# Patient Record
Sex: Female | Born: 1970 | Race: Black or African American | Hispanic: No | Marital: Single | Smoking: Never smoker
Health system: Southern US, Community
[De-identification: ages and names within clinical notes are randomized; demographics above are authoritative.]

## PROBLEM LIST (undated history)

## (undated) DIAGNOSIS — M25471 Effusion, right ankle: Secondary | ICD-10-CM

## (undated) DIAGNOSIS — M25472 Effusion, left ankle: Secondary | ICD-10-CM

## (undated) DIAGNOSIS — K829 Disease of gallbladder, unspecified: Secondary | ICD-10-CM

## (undated) DIAGNOSIS — E559 Vitamin D deficiency, unspecified: Secondary | ICD-10-CM

## (undated) DIAGNOSIS — I10 Essential (primary) hypertension: Secondary | ICD-10-CM

## (undated) HISTORY — PX: CHOLECYSTECTOMY: SHX55

## (undated) HISTORY — DX: Effusion, left ankle: M25.472

## (undated) HISTORY — DX: Vitamin D deficiency, unspecified: E55.9

## (undated) HISTORY — DX: Disease of gallbladder, unspecified: K82.9

## (undated) HISTORY — DX: Essential (primary) hypertension: I10

## (undated) HISTORY — DX: Effusion, right ankle: M25.471

---

## 1998-12-28 ENCOUNTER — Emergency Department (HOSPITAL_COMMUNITY): Admission: EM | Admit: 1998-12-28 | Discharge: 1998-12-28 | Payer: Self-pay

## 1999-08-07 ENCOUNTER — Other Ambulatory Visit: Admission: RE | Admit: 1999-08-07 | Discharge: 1999-08-07 | Payer: Self-pay | Admitting: *Deleted

## 2000-09-22 ENCOUNTER — Other Ambulatory Visit: Admission: RE | Admit: 2000-09-22 | Discharge: 2000-09-22 | Payer: Self-pay | Admitting: *Deleted

## 2001-10-05 ENCOUNTER — Other Ambulatory Visit: Admission: RE | Admit: 2001-10-05 | Discharge: 2001-10-05 | Payer: Self-pay | Admitting: *Deleted

## 2002-10-26 ENCOUNTER — Other Ambulatory Visit: Admission: RE | Admit: 2002-10-26 | Discharge: 2002-10-26 | Payer: Self-pay | Admitting: *Deleted

## 2006-12-01 ENCOUNTER — Encounter: Admission: RE | Admit: 2006-12-01 | Discharge: 2006-12-01 | Payer: Self-pay | Admitting: Family Medicine

## 2008-08-25 IMAGING — CR DG KNEE COMPLETE 4+V*R*
4 series · 4 of 4 positions shown · non-contrast
Comparison: none

CLINICAL DATA: Patient fell on 11/29/06 and has pain in the patella region.  
 RIGHT KNEE FOUR VIEWS:
 There is soft tissue swelling anterior to the patella suggesting either hematoma or traumatic prepatellar bursitis.  The patella itself appears normal.  The patient does have a small benign appearing exostosis from the medial aspect of the metaphyseal region of the proximal right tibia.  This was described on the prior study dated 12/28/98.  There is also an approximately 3.3cm sclerotic lesion in the distal femoral shaft.  It has benign characteristics and may represent an old bone infarct or perhaps the remnant of non ossifying fibroma.  The patient has no history of malignancy, I doubt that this is of any clinical significance.

[view not recorded (1 of 4)]
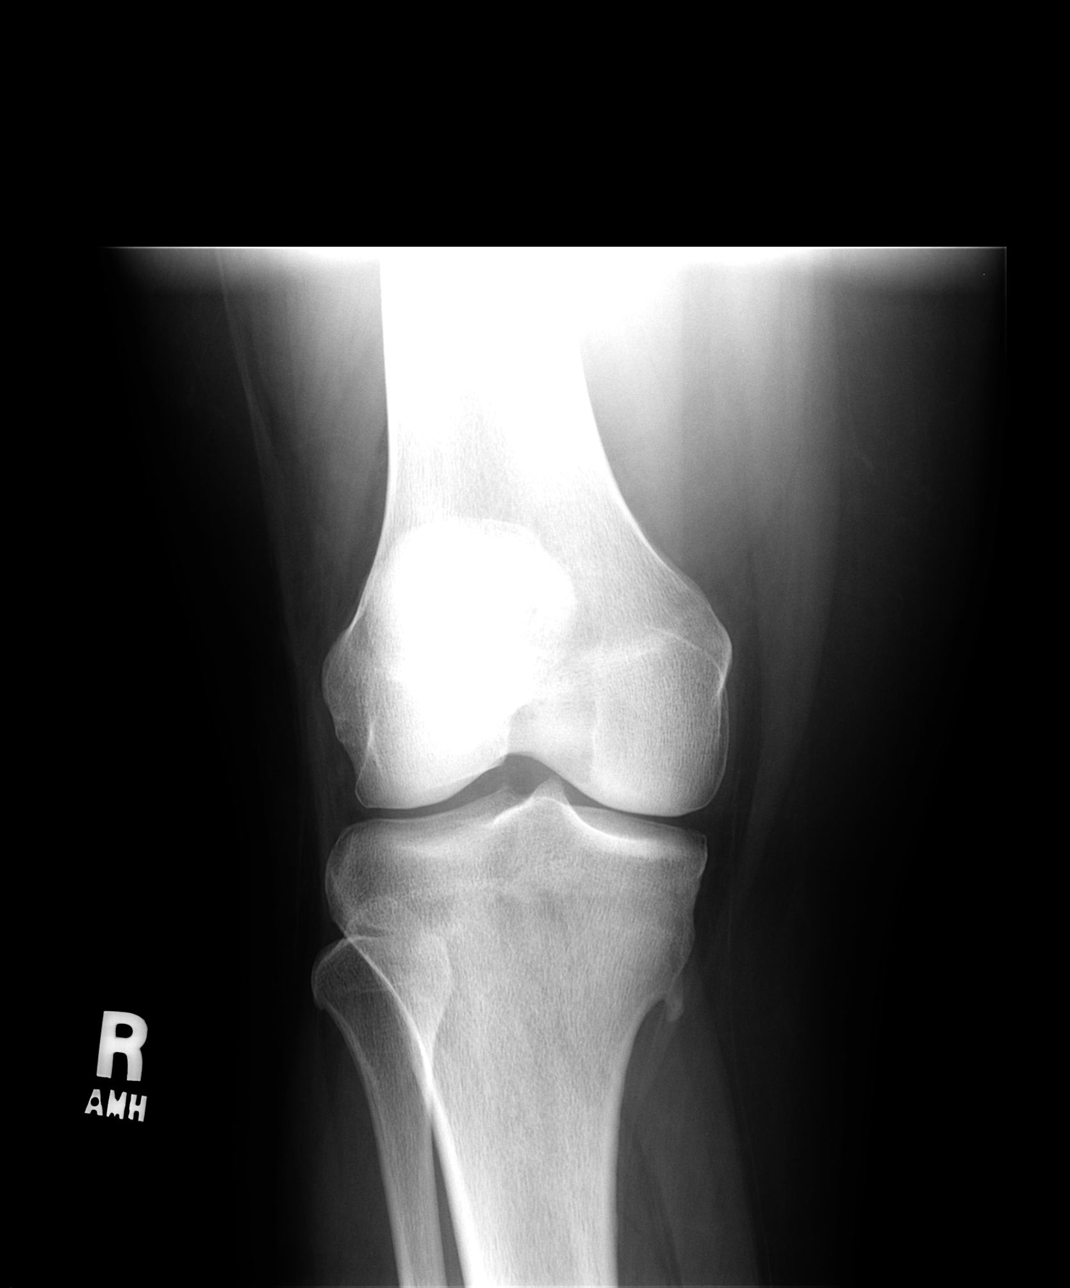

[view not recorded (2 of 4)]
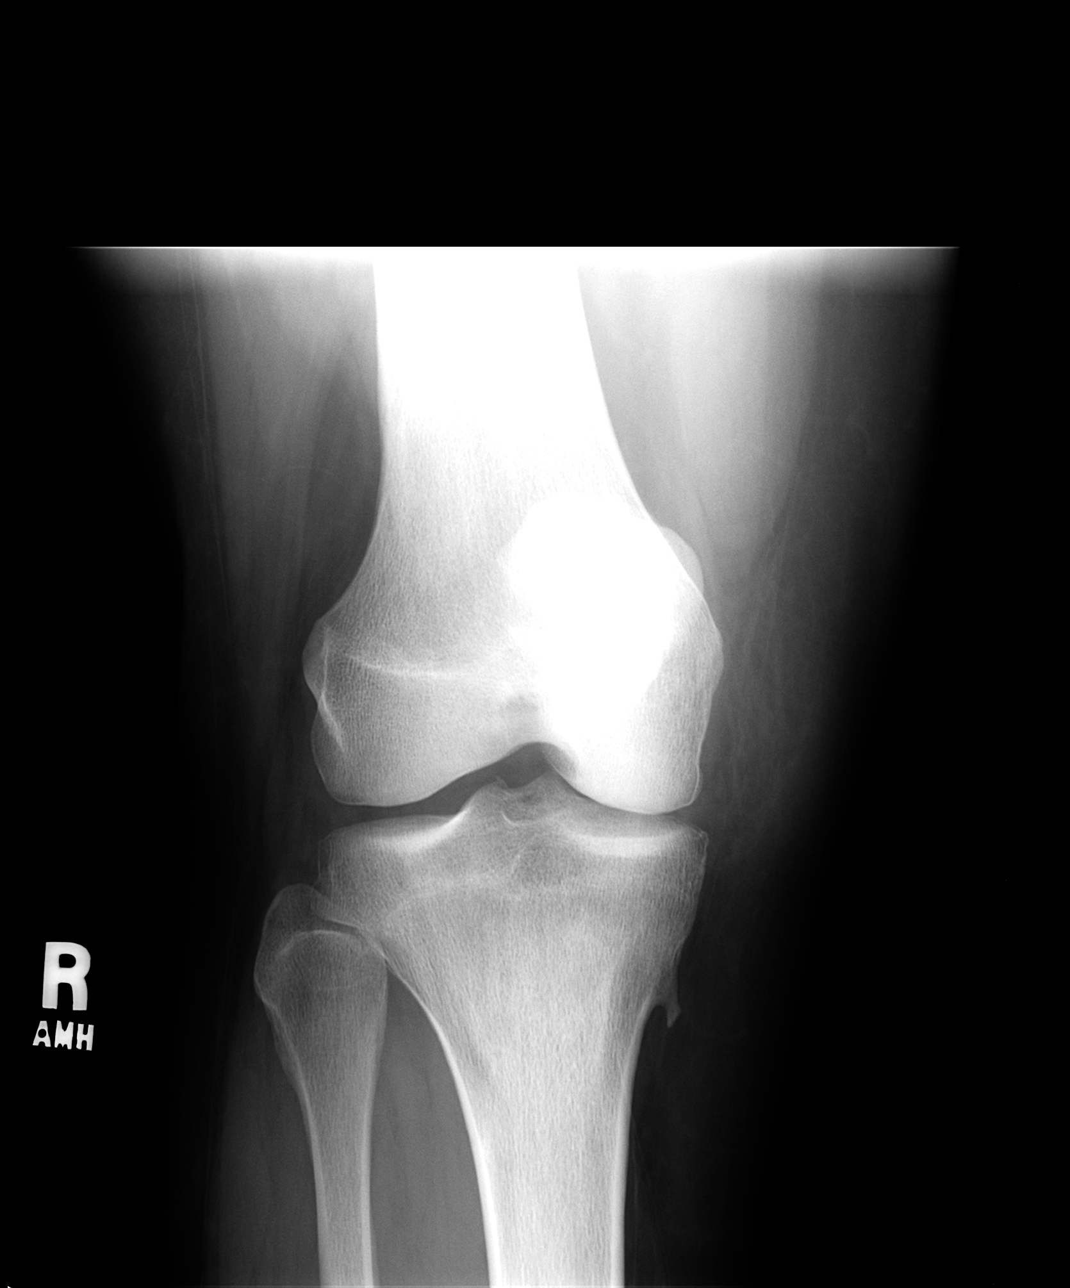

[view not recorded (3 of 4)]
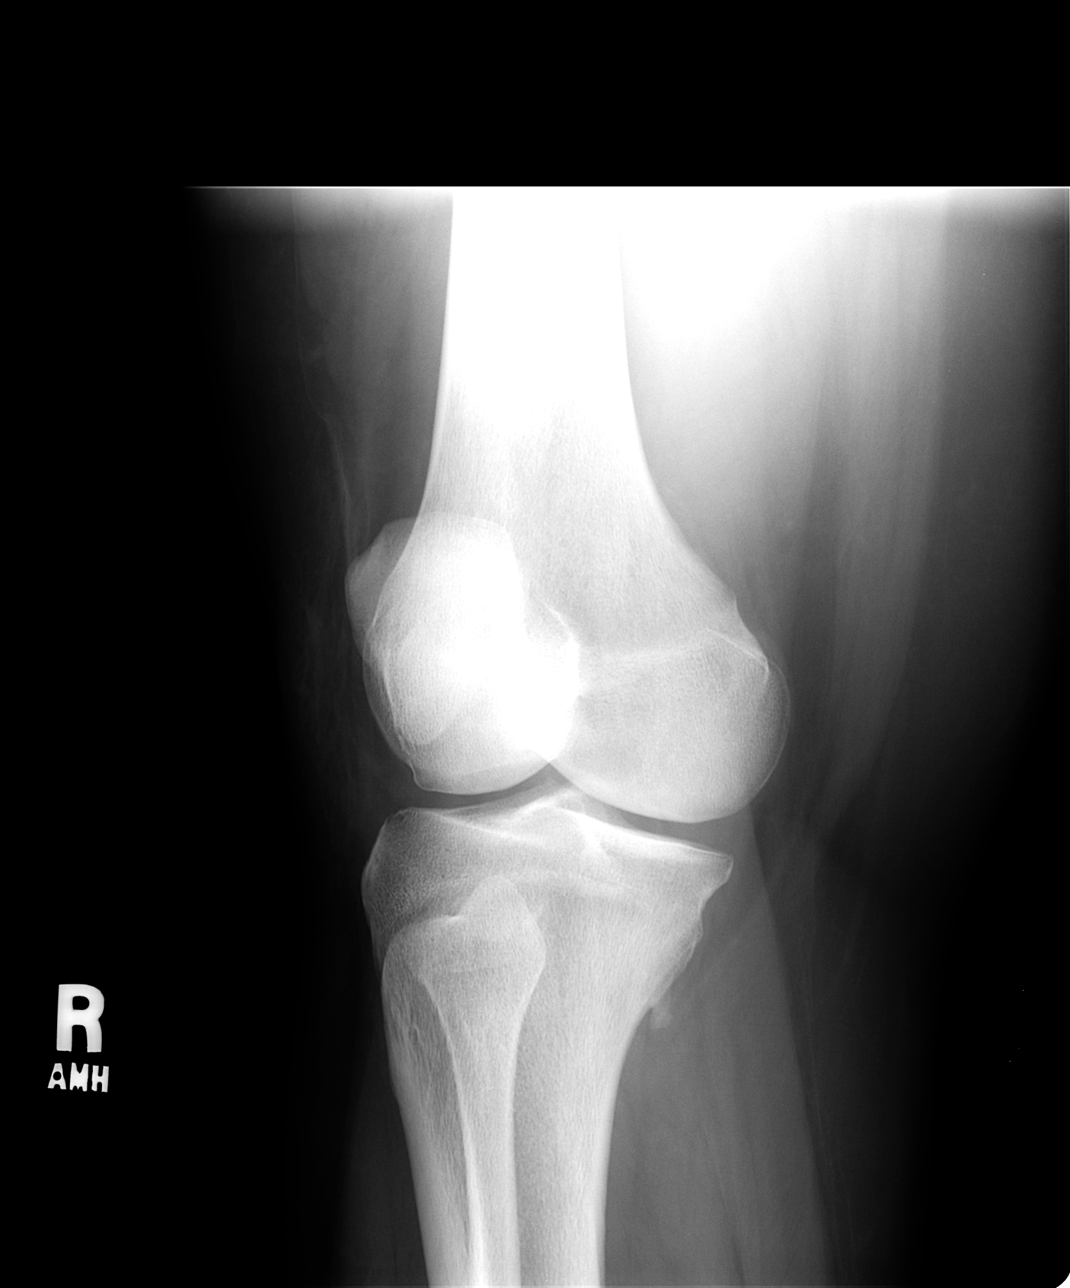

[view not recorded (4 of 4)]
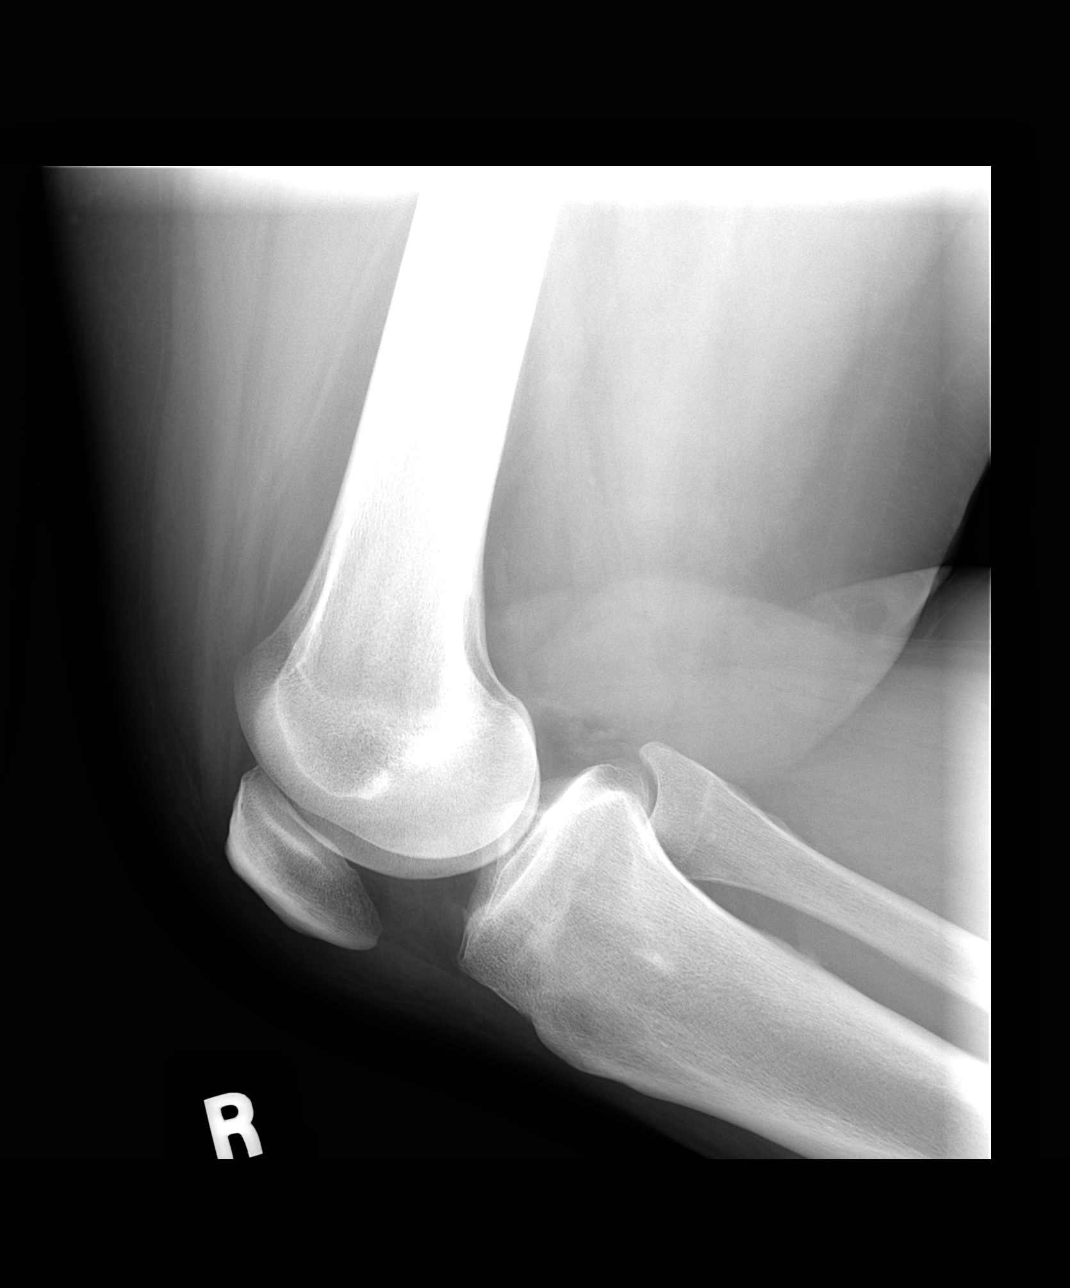

[4 of 4 positions shown; findings below may reference images not displayed]

IMPRESSION: Soft tissue swelling anterior to the patella.  No acute bony abnormality.

## 2013-09-30 ENCOUNTER — Other Ambulatory Visit: Payer: Self-pay | Admitting: Family Medicine

## 2013-09-30 ENCOUNTER — Ambulatory Visit
Admission: RE | Admit: 2013-09-30 | Discharge: 2013-09-30 | Disposition: A | Discharge status: Home / Self Care | Payer: 59 | Source: Ambulatory Visit | Attending: Family Medicine | Admitting: Family Medicine

## 2013-09-30 DIAGNOSIS — R05 Cough: Secondary | ICD-10-CM

## 2013-09-30 DIAGNOSIS — R059 Cough, unspecified: Secondary | ICD-10-CM

## 2018-07-28 ENCOUNTER — Encounter (INDEPENDENT_AMBULATORY_CARE_PROVIDER_SITE_OTHER): Payer: Self-pay

## 2018-08-06 ENCOUNTER — Ambulatory Visit (INDEPENDENT_AMBULATORY_CARE_PROVIDER_SITE_OTHER): Payer: Self-pay | Admitting: Bariatrics

## 2018-08-20 ENCOUNTER — Ambulatory Visit (INDEPENDENT_AMBULATORY_CARE_PROVIDER_SITE_OTHER): Payer: Self-pay | Admitting: Bariatrics

## 2018-10-07 ENCOUNTER — Other Ambulatory Visit: Payer: Self-pay

## 2018-10-07 ENCOUNTER — Encounter (INDEPENDENT_AMBULATORY_CARE_PROVIDER_SITE_OTHER): Payer: Self-pay | Admitting: Family Medicine

## 2018-10-07 ENCOUNTER — Ambulatory Visit (INDEPENDENT_AMBULATORY_CARE_PROVIDER_SITE_OTHER): Payer: BC Managed Care – PPO | Admitting: Family Medicine

## 2018-10-07 VITALS — BP 139/89 | HR 77 | Temp 98.1°F | Ht 66.0 in | Wt 249.0 lb

## 2018-10-07 DIAGNOSIS — R5383 Other fatigue: Secondary | ICD-10-CM | POA: Diagnosis not present

## 2018-10-07 DIAGNOSIS — I10 Essential (primary) hypertension: Secondary | ICD-10-CM | POA: Diagnosis not present

## 2018-10-07 DIAGNOSIS — Z9189 Other specified personal risk factors, not elsewhere classified: Secondary | ICD-10-CM | POA: Diagnosis not present

## 2018-10-07 DIAGNOSIS — Z6841 Body Mass Index (BMI) 40.0 and over, adult: Secondary | ICD-10-CM

## 2018-10-07 DIAGNOSIS — R0602 Shortness of breath: Secondary | ICD-10-CM | POA: Diagnosis not present

## 2018-10-07 DIAGNOSIS — Z0289 Encounter for other administrative examinations: Secondary | ICD-10-CM

## 2018-10-07 DIAGNOSIS — E559 Vitamin D deficiency, unspecified: Secondary | ICD-10-CM

## 2018-10-07 DIAGNOSIS — Z1331 Encounter for screening for depression: Secondary | ICD-10-CM | POA: Diagnosis not present

## 2018-10-07 NOTE — Progress Notes (Signed)
Office: [phone]  /  Fax: [phone]   Dear Dr. Cherly Hensen,   Thank you for referring Mackenzie Watson to our clinic. The following note includes my evaluation and treatment recommendations.  HPI:   Chief Complaint: OBESITY    Mackenzie Watson has been referred by Maxie Better, MD for consultation regarding her obesity and obesity related comorbidities.    Mackenzie Watson (MR# [id number]) is a 48 y.o. female who presents on 10/07/2018 for obesity evaluation and treatment. Current BMI is Body mass index is 40.19 kg/m. Mackenzie Watson has been struggling with her weight for many years and has been unsuccessful in either losing weight, maintaining weight loss, or reaching her healthy weight goal.     Mackenzie Watson attended our information session and states she is currently in the action stage of change and ready to dedicate time achieving and maintaining a healthier weight. Mackenzie Watson is interested in becoming our patient and working on intensive lifestyle modifications including (but not limited to) diet, exercise and weight loss.    Mackenzie Watson states her desired weight loss is 54-64 lbs her heaviest weight ever was 240-245 lbs she has significant food cravings issues  she snacks frequently in the evenings she skips meals frequently she is frequently drinking liquids with calories she struggles with emotional eating    Fatigue Mackenzie Watson feels her energy is lower than it should be. This has worsened with weight gain and has not worsened recently. Mackenzie Watson admits to daytime somnolence and  admits to waking up still tired. Patient is at risk for obstructive sleep apnea. Mackenzie Watson has a history of symptoms of daytime fatigue. Patient generally gets 6 hours of sleep per night, and states they generally have generally restful sleep. Snoring is present. Apneic episodes are not present. Epworth Sleepiness Score is 10.  Dyspnea on exertion Mackenzie Watson notes increasing shortness of breath with exercising and seems to  be worsening over time with weight gain. She notes getting out of breath sooner with activity than she used to. This has not gotten worse recently. EKG-normal sinus rhythm at 80 BPM. Mackenzie Watson denies orthopnea.  Hypertension Mackenzie Watson is a 48 y.o. female with hypertension. Mackenzie Watson is on hydrochlorothiazide 12.5 mg. Her EKG shows normal sinus rhythm at 80 BPM. She denies chest pain, chest pressure, or headaches. She is working on weight loss to help control her blood pressure with the goal of decreasing her risk of heart attack and stroke.   Vitamin D Deficiency Mackenzie Watson has a diagnosis of vitamin D deficiency. She is currently taking Vit D. She notes fatigue and denies nausea, vomiting or muscle weakness.  At risk for osteopenia and osteoporosis Mackenzie Watson is at higher risk of osteopenia and osteoporosis due to vitamin D deficiency.   Depression Screen Mackenzie Watson's Food and Mood (modified PHQ-9) score was  Depression screen PHQ 2/9 10/07/2018  Decreased Interest 1  Down, Depressed, Hopeless 1  PHQ - 2 Score 2  Altered sleeping 1  Tired, decreased energy 2  Change in appetite 2  Feeling bad or failure about yourself  0  Trouble concentrating 0  Moving slowly or fidgety/restless 0  Suicidal thoughts 0  PHQ-9 Score 7  Difficult doing work/chores Not difficult at all    ASSESSMENT AND PLAN:  Other fatigue - Plan: EKG 12-Lead, Vitamin B12, CBC With Differential, Comprehensive metabolic panel, Hemoglobin A1c, Insulin, random, T3, T4, free, TSH, Folate  Shortness of breath on exertion - Plan: Lipid Panel With LDL/HDL Ratio  Essential hypertension - Plan: Comprehensive metabolic panel  Vitamin D deficiency - Plan: VITAMIN D 25 Hydroxy (Vit-D Deficiency, Fractures)  Depression screening  At risk for osteoporosis  Class 3 severe obesity with serious comorbidity and body mass index (BMI) of 40.0 to 44.9 in adult, unspecified obesity type [facility])  PLAN:  Fatigue Mackenzie Watson was informed that  her fatigue may be related to obesity, depression or many other causes. Labs will be ordered, and in the meanwhile Mackenzie Watson has agreed to work on diet, exercise and weight loss to help with fatigue. Proper sleep hygiene was discussed including the need for 7-8 hours of quality sleep each night. A sleep study was not ordered based on symptoms and Epworth score.  Dyspnea on exertion Mackenzie Watson's shortness of breath appears to be obesity related and exercise induced. She has agreed to work on weight loss and gradually increase exercise to treat her exercise induced shortness of breath. If Mackenzie Watson follows our instructions and loses weight without improvement of her shortness of breath, we will plan to refer to pulmonology. We will monitor this condition regularly. Mackenzie Watson agrees to this plan.  Hypertension We discussed sodium restriction, working on healthy weight loss, and a regular exercise program as the means to achieve improved blood pressure control. Mackenzie Watson agreed with this plan and agreed to follow up as directed. We will continue to monitor her blood pressure as well as her progress with the above lifestyle modifications. She will continue her medications as prescribed and will watch for signs of hypotension as she continues her lifestyle modifications. EKG was done, and we will check CMP today. Mackenzie Watson agrees to follow up with our clinic in 2 weeks.  Vitamin D Deficiency Mackenzie Watson was informed that low vitamin D levels contributes to fatigue and are associated with obesity, breast, and colon cancer. Mackenzie Watson agrees to continue taking Vitamin D3 and will follow up for routine testing of vitamin D, at least 2-3 times per year. She was informed of the risk of over-replacement of vitamin D and agrees to not increase her dose unless she discusses this with [place] first. We will check Vit D level today. Mackenzie Watson agrees to follow up with our clinic in 2 weeks.  At risk for osteopenia and osteoporosis Mackenzie Watson was  given extended (15 minutes) osteoporosis prevention counseling today. Mackenzie Watson is at risk for osteopenia and osteoporsis due to her vitamin D deficiency. She was encouraged to take her vitamin D and follow her higher calcium diet and increase strengthening exercise to help strengthen her bones and decrease her risk of osteopenia and osteoporosis.  Depression Screen Mackenzie Watson had a mildly positive depression screening. Depression is commonly associated with obesity and often results in emotional eating behaviors. We will monitor this closely and work on CBT to help improve the non-hunger eating patterns. Referral to Psychology may be required if no improvement is seen as she continues in our clinic.  Obesity Mackenzie Watson is currently in the action stage of change and her goal is to continue with weight loss efforts. I recommend Mackenzie Watson begin the structured treatment plan as follows:  She has agreed to follow the Category 1 plan Mackenzie Watson has been instructed to eventually work up to a goal of 150 minutes of combined cardio and strengthening exercise per week for weight loss and overall health benefits. We discussed the following Behavioral Modification Strategies today: increasing lean protein intake, increasing vegetables and work on meal planning and easy cooking plans, keeping healthy foods in the home, better snacking choices, and planning for success   She was informed of  the importance of frequent follow up visits to maximize her success with intensive lifestyle modifications for her multiple health conditions. She was informed we would discuss her lab results at her next visit unless there is a critical issue that needs to be addressed sooner. Mackenzie Watson agreed to keep her next visit at the agreed upon time to discuss these results.  ALLERGIES: No Known Allergies  MEDICATIONS: Current Outpatient Medications on File Prior to Visit  Medication Sig Dispense Refill   Cholecalciferol (VITAMIN D3) 25 MCG  (1000 UT) CAPS Take 1,000 Units by mouth.     COLLAGEN PO Take by mouth. 1 tblspn     hydrochlorothiazide (MICROZIDE) 12.5 MG capsule Take 12.5 mg by mouth daily.     meloxicam (MOBIC) 7.5 MG tablet Take 7.5 mg by mouth daily.     Multiple Vitamin (MULTIVITAMIN WITH MINERALS) TABS tablet Take 1 tablet by mouth daily.     norethindrone (MICRONOR) 0.35 MG tablet Take 1 tablet by mouth daily.     No current facility-administered medications on file prior to visit.     PAST MEDICAL HISTORY: Past Medical History:  Diagnosis Date   Gallbladder problem    High blood pressure    Swollen ankles    Vitamin D deficiency     PAST SURGICAL HISTORY: Past Surgical History:  Procedure Laterality Date   CHOLECYSTECTOMY      SOCIAL HISTORY: Social History   Tobacco Use   Smoking status: Never Smoker   Smokeless tobacco: Never Used  Substance Use Topics   Alcohol use: Not on file   Drug use: Not on file    FAMILY HISTORY: Family History  Problem Relation Age of Onset   Heart disease Mother    Sudden death Mother    High blood pressure Father    Stroke Father     ROS: Review of Systems  Constitutional: Positive for malaise/fatigue. Negative for weight loss.  Eyes:       + Wear glasses or contacts  Respiratory: Positive for shortness of breath (with exertion).   Cardiovascular: Negative for chest pain and orthopnea.       Negative chest pressure  Gastrointestinal: Negative for nausea and vomiting.  Musculoskeletal:       Negative muscle weakness + Muscle or joint pain + Red or swollen joints  Neurological: Negative for headaches.    PHYSICAL EXAM: Blood pressure 139/89, pulse 77, temperature 98.1 F (36.7 C), temperature source Oral, height 5\' 6"  (1.676 m), weight 249 lb (112.9 kg), last menstrual period 06/21/2018, SpO2 99 %. Body mass index is 40.19 kg/m. Physical Exam Vitals signs reviewed.  Constitutional:      Appearance: Normal appearance. She  is obese.  HENT:     Head: Normocephalic and atraumatic.     Nose: Nose normal.  Eyes:     General: No scleral icterus.    Extraocular Movements: Extraocular movements intact.  Neck:     Musculoskeletal: Normal range of motion and neck supple.     Comments: No thyromegaly present Cardiovascular:     Rate and Rhythm: Normal rate and regular rhythm.     Pulses: Normal pulses.     Heart sounds: Normal heart sounds.  Pulmonary:     Effort: Pulmonary effort is normal. No respiratory distress.     Breath sounds: Normal breath sounds.  Abdominal:     Palpations: Abdomen is soft.     Tenderness: There is no abdominal tenderness.     Comments: + Obesity  Musculoskeletal: Normal range of motion.     Right lower leg: No edema.     Left lower leg: No edema.  Skin:    General: Skin is warm and dry.  Neurological:     Mental Status: She is alert and oriented to person, place, and time.     Coordination: Coordination normal.  Psychiatric:        Mood and Affect: Mood normal.        Behavior: Behavior normal.     RECENT LABS AND TESTS: BMET No results found for: NA, K, CL, CO2, GLUCOSE, BUN, CREATININE, CALCIUM, GFRNONAA, GFRAA No results found for: HGBA1C No results found for: INSULIN CBC No results found for: WBC, RBC, HGB, HCT, PLT, MCV, MCH, MCHC, RDW, LYMPHSABS, MONOABS, EOSABS, BASOSABS Iron/TIBC/Ferritin/ %Sat No results found for: IRON, TIBC, FERRITIN, IRONPCTSAT Lipid Panel  No results found for: CHOL, TRIG, HDL, CHOLHDL, VLDL, LDLCALC, LDLDIRECT Hepatic Function Panel  No results found for: PROT, ALBUMIN, AST, ALT, ALKPHOS, BILITOT, BILIDIR, IBILI No results found for: TSH  ECG  shows NSR with a rate of 80 BPM INDIRECT CALORIMETER done today shows a VO2 of 170 and a REE of 1181.  Her calculated basal metabolic rate is 4098 thus her basal metabolic rate is worse than expected.       OBESITY BEHAVIORAL INTERVENTION VISIT  Today's visit was # 1   Starting weight:  249 lbs Starting date: 10/07/2018 Today's weight : 249 lbs  Today's date: 10/07/2018 Total lbs lost to date: 0    ASK: We discussed the diagnosis of obesity with Mackenzie Watson today and [employer] agreed to give [place] permission to discuss obesity behavioral modification therapy today.  ASSESS: Raelene has the diagnosis of obesity and her BMI today is 40.21 Milena is in the action stage of change   ADVISE: Kasiyah was educated on the multiple health risks of obesity as well as the benefit of weight loss to improve her health. She was advised of the need for long term treatment and the importance of lifestyle modifications to improve her current health and to decrease her risk of future health problems.  AGREE: Multiple dietary modification options and treatment options were discussed and  Naraya agreed to follow the recommendations documented in the above note.  ARRANGE: Shantese was educated on the importance of frequent visits to treat obesity as outlined per CMS and USPSTF guidelines and agreed to schedule her next follow up appointment today.  I, Burt Knack, am acting as transcriptionist for Debbra Riding, MD  I have reviewed the above documentation for accuracy and completeness, and I agree with the above. - Debbra Riding, MD

## 2018-10-08 LAB — CBC WITH DIFFERENTIAL
Basophils Absolute: 0 10*3/uL (ref 0.0–0.2)
Basos: 1 %
EOS (ABSOLUTE): 0.2 10*3/uL (ref 0.0–0.4)
Eos: 3 %
Hematocrit: 42.9 % (ref 34.0–46.6)
Hemoglobin: 13.5 g/dL (ref 11.1–15.9)
Immature Grans (Abs): 0 10*3/uL (ref 0.0–0.1)
Immature Granulocytes: 0 %
Lymphocytes Absolute: 2.2 10*3/uL (ref 0.7–3.1)
Lymphs: 37 %
MCH: 25.6 pg — ABNORMAL LOW (ref 26.6–33.0)
MCHC: 31.5 g/dL (ref 31.5–35.7)
MCV: 81 fL (ref 79–97)
Monocytes Absolute: 0.3 10*3/uL (ref 0.1–0.9)
Monocytes: 5 %
Neutrophils Absolute: 3.2 10*3/uL (ref 1.4–7.0)
Neutrophils: 54 %
RBC: 5.28 x10E6/uL (ref 3.77–5.28)
RDW: 13.6 % (ref 11.7–15.4)
WBC: 5.9 10*3/uL (ref 3.4–10.8)

## 2018-10-08 LAB — INSULIN, RANDOM: INSULIN: 16.2 u[IU]/mL (ref 2.6–24.9)

## 2018-10-08 LAB — COMPREHENSIVE METABOLIC PANEL
ALT: 19 IU/L (ref 0–32)
AST: 22 IU/L (ref 0–40)
Albumin/Globulin Ratio: 1.3 (ref 1.2–2.2)
Albumin: 4.3 g/dL (ref 3.8–4.8)
Alkaline Phosphatase: 68 IU/L (ref 39–117)
BUN/Creatinine Ratio: 14 (ref 9–23)
BUN: 10 mg/dL (ref 6–24)
Bilirubin Total: 0.5 mg/dL (ref 0.0–1.2)
CO2: 24 mmol/L (ref 20–29)
Calcium: 9.6 mg/dL (ref 8.7–10.2)
Chloride: 104 mmol/L (ref 96–106)
Creatinine, Ser: 0.73 mg/dL (ref 0.57–1.00)
GFR calc Af Amer: 113 mL/min/{1.73_m2} (ref >59)
GFR calc non Af Amer: 98 mL/min/{1.73_m2} (ref >59)
Globulin, Total: 3.3 g/dL (ref 1.5–4.5)
Glucose: 92 mg/dL (ref 65–99)
Potassium: 4.2 mmol/L (ref 3.5–5.2)
Sodium: 142 mmol/L (ref 134–144)
Total Protein: 7.6 g/dL (ref 6.0–8.5)

## 2018-10-08 LAB — LIPID PANEL WITH LDL/HDL RATIO
Cholesterol, Total: 177 mg/dL (ref 100–199)
HDL: 54 mg/dL (ref >39)
LDL Calculated: 107 mg/dL — ABNORMAL HIGH (ref 0–99)
LDl/HDL Ratio: 2 ratio (ref 0.0–3.2)
Triglycerides: 78 mg/dL (ref 0–149)
VLDL Cholesterol Cal: 16 mg/dL (ref 5–40)

## 2018-10-08 LAB — FOLATE: Folate: 10.1 ng/mL (ref >3.0)

## 2018-10-08 LAB — TSH: TSH: 1.31 u[IU]/mL (ref 0.450–4.500)

## 2018-10-08 LAB — T4, FREE: Free T4: 1.08 ng/dL (ref 0.82–1.77)

## 2018-10-08 LAB — HEMOGLOBIN A1C
Est. average glucose Bld gHb Est-mCnc: 120 mg/dL
Hgb A1c MFr Bld: 5.8 % — ABNORMAL HIGH (ref 4.8–5.6)

## 2018-10-08 LAB — VITAMIN D 25 HYDROXY (VIT D DEFICIENCY, FRACTURES): Vit D, 25-Hydroxy: 16.9 ng/mL — ABNORMAL LOW (ref 30.0–100.0)

## 2018-10-08 LAB — SPECIMEN STATUS REPORT

## 2018-10-08 LAB — T3: T3, Total: 136 ng/dL (ref 71–180)

## 2018-10-08 LAB — VITAMIN B12: Vitamin B-12: 724 pg/mL (ref 232–1245)

## 2018-10-19 ENCOUNTER — Other Ambulatory Visit: Payer: Self-pay

## 2018-10-19 ENCOUNTER — Ambulatory Visit (INDEPENDENT_AMBULATORY_CARE_PROVIDER_SITE_OTHER): Payer: BC Managed Care – PPO | Admitting: Psychology

## 2018-10-19 DIAGNOSIS — F3289 Other specified depressive episodes: Secondary | ICD-10-CM

## 2018-10-19 NOTE — Progress Notes (Signed)
Office: [phone]  /  Fax: [phone]    Date: October 19, 2018   Appointment Start Time: 2:04pm Duration: 52 minutes Provider: Glennie Isle, Psy.D. Type of Session: Intake for Individual Therapy  Location of Patient: Home Location of Provider: Provider's Home Type of Contact: Telepsychological Visit via [employer] WebEx   Informed Consent: This provider called Taqwa at 2:02pm as she did not present for today's Webex appointment. A  HIPAA compliant voicemail was left requesting a call back. She was observed signing onto Webex at 2:04pm. At 2:27pm, Kaytelyn was no longer connected to the Webex appointment due to low battery on her cell phone. This provider attempted to call Sheenah on a few occassions; however, she did not answer. This provider then called her home phone number and she answered. Instructions to join the [employer] via a phone call was provided; however, at 2:37pm, the appointment resumed as Laurieann was able to join the [employer] appointment via her laptop.   Prior to proceeding with today's appointment, two pieces of identifying information were obtained from Earlie Server to verify identity. In addition, Jamelah's physical location at the time of this appointment was obtained. Ruba reported she was at home and provided the address. In the event of technical difficulties, Shainna shared a phone number she could be reached at. Kimberlyn and this provider participated in today's telepsychological service. Also, Nataly denied anyone else being present in the room or on the WebEx appointment.   The provider's role was explained to [employer]. The provider reviewed and discussed issues of confidentiality, privacy, and limits therein (e.g., reporting obligations). In addition to verbal informed consent, written informed consent for psychological services was obtained from [facility] prior to the initial intake interview. Written consent included information concerning the practice,  financial arrangements, and confidentiality and patients' rights. Since the clinic is not a 24/7 crisis center, mental health emergency resources were shared, and the provider explained MyChart, e-mail, voicemail, and/or other messaging systems should be utilized only for non-emergency reasons. This provider also explained that information obtained during appointments will be placed in Chelsae's medical record in a confidential manner and relevant information will be shared with other providers at [facility] that she meets with for coordination of care. Heavenly verbally acknowledged understanding of the aforementioned, and agreed to use mental health emergency resources discussed if needed. Moreover, Reta agreed information may be shared with other [facility] providers as needed for coordination of care. By signing the service agreement document, Ajane provided written consent for coordination of care.   Prior to initiating telepsychological services, Jenesis was provided with an informed consent document, which included the development of a safety plan (i.e., an emergency contact and emergency resources) in the event of an emergency/crisis. Sharnell expressed understanding of the rationale of the safety plan and provided consent for this provider to reach out to her emergency contact in the event of an emergency/crisis.Krystian returned the completed consent form prior to today's appointment. This provider verbally reviewed the consent form during today's appointment prior to proceeding with the appointment. Aquila verbally acknowledged understanding that she is ultimately responsible for understanding her insurance benefits as it relates to reimbursement of telepsychological services. This provider also reviewed confidentiality, as it relates to telepsychological services, as well as the rationale for telepsychological services. More specifically, this provider's clinic is  limiting in-person visits due to COVID-19. Therapeutic services will resume to in-person appointments once deemed appropriate. Lior expressed understanding regarding the rationale for telepsychological  services. In addition, this provider explained the telepsychological services informed consent document would be considered an addendum to the initial consent document/service agreement. Kaelan verbally consented to proceed.   Chief Complaint/HPI: Jamacia was referred by Dr. Ilene Qua on October 07, 2018. During the appointment with Dr. Ilene Qua on October 07, 2018, Mayme reported experiencing the following: significant food cravings issues, snacking frequently in the evenings, frequently drinking liquids with calories, struggling with emotional eating and skipping meals frequently.   During today's appointment, Melek reported eating when she is stressed and noted she has comfort foods. She reported she craves sweets, such as cookies, candies, and cake. Appolonia was verbally administered a questionnaire assessing various behaviors related to emotional eating. Marquesha endorsed the following: overeat when you are celebrating, experience food cravings on a regular basis, eat certain foods when you are anxious, stressed, depressed, or your feelings are hurt, use food to help you cope with emotional situations, find food is comforting to you, overeat when you are angry or upset, overeat when you are worried about something, overeat frequently when you are bored or lonely and eat as a reward. She denied engaging in binge eating, but acknowledged engaging in mindless eating. Malajah noted the onset of emotional eating was likely around 07-04-2005 when her father was sick. She explained she had to travel back and forth when he was sick as he resided 4 hours away and she noted, "I know I did indulge more and was not worried about what I was eating." She explained she was eating on the go and noted she was using  food to self-soothe. He passed away in 07-04-2012 and she noted she would eat to comfort herself after his passing. Currently, Lailee reported the frequency of emotional eating is situational and contingent upon when she is able to eat at work.   Lameeka shared she attempted to try different eating plans to help with weight loss in the past; however, she shared death anniversaries, holidays, and birthdays trigger emotional eating. Gertie denied a history of restricting food intake, purging and engagement in other compensatory strategies, and has never been diagnosed with an eating disorder. She also denied a history of treatment for emotional eating. Raevyn acknowledged learning portion control and exercising has assisted with emotional eating, whereas, anniversaries, boredom and stress trigger emotional eating. She also discussed purposely not purchasing certain foods to avoid eating them. Furthermore, she denied any other current problems of concern. She noted the current structured meal plan is going "pretty good."   Mental Status Examination:  Appearance: neat Behavior: cooperative Mood: euthymic Affect: mood congruent Speech: normal in rate, volume, and tone Eye Contact: appropriate Psychomotor Activity: appropriate Thought Process: linear, logical, and goal directed  Content/Perceptual Disturbances: denies suicidal and homicidal ideation, plan, and intent and no hallucinations, delusions, bizarre thinking or behavior reported or observed Orientation: time, person, place and purpose of appointment Cognition/Sensorium: memory, attention, language, and fund of knowledge intact  Insight: good Judgment: good  Family & Psychosocial History: Ivonna reported she is single and does not have any children. 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 Currently,  Felisia's social support system consists of her brother, twin sister, sorority sisters, and other friends. Moreover, Dezaray stated she resides alone. She noted both her parents are deceased.  Medical History:  Past Medical History:  Diagnosis Date   Gallbladder problem  High blood pressure    Swollen ankles    Vitamin D deficiency    Past Surgical History:  Procedure Laterality Date   CHOLECYSTECTOMY     Current Outpatient Medications on File Prior to Visit  Medication Sig Dispense Refill   Cholecalciferol (VITAMIN D3) 25 MCG (1000 UT) CAPS Take 1,000 Units by mouth.     COLLAGEN PO Take by mouth. 1 tblspn     hydrochlorothiazide (MICROZIDE) 12.5 MG capsule Take 12.5 mg by mouth daily.     meloxicam (MOBIC) 7.5 MG tablet Take 7.5 mg by mouth daily.     Multiple Vitamin (MULTIVITAMIN WITH MINERALS) TABS tablet Take 1 tablet by mouth daily.     norethindrone (MICRONOR) 0.35 MG tablet Take 1 tablet by mouth daily.     No current facility-administered medications on file prior to visit.   Zhuri denied a history of head injuries and loss of consciousness.   Mental Health History: Shaleen endorsed a history of mental health treatment, including therapeutic services. More specifically, she attended grief counseling in July 23, 2012 after her father passed away for 6 weeks at a church and attended an additional session the following year. Christasia denied a history of hospitalizations for psychiatric concerns, and has never met with a psychiatrist. She denied ever being prescribed psychotropic medications. De denied a family history of mental health related concerns; however, she shared her father was diagnosed with dementia. Tenecia denied a trauma history, including psychological, physical  and sexual abuse, as well as neglect.   Mildred described her typical mood as "even keel." Aside from concerns noted above and endorsed on the PHQ-9, Nyisha reported experiencing worry about the  pandemic. Debar endorsed "occassional" alcohol use. She indicated the last time she consumed alcohol was in January and shared that when she consumes alcohol it is in the form of one standard drink. Regarding caffeine intake, Symphonie reported she consumes tea daily and diet soda occassionally. She denied illicit/recreational substance use. Furthermore, Meridith denied experiencing the following: hopelessness, memory concerns, obsessions and compulsions, hallucinations and delusions, paranoia, mania and decreased motivation. She also denied history of and current suicidal ideation, plan, and intent; history of and current homicidal ideation, plan, and intent; and history of and current engagement in self-harm.  The following strengths were reported by Earlie Server: organization, finishing projects, being available to others, helpful, and listening to others. The following strengths were observed by this provider: ability to express thoughts and feelings during the therapeutic session, ability to establish and benefit from a therapeutic relationship, ability to learn and practice coping skills, willingness to work toward established goal(s) with the clinic and ability to engage in reciprocal conversation.  Legal History: Adria denied a history of legal involvement.   Structured Assessment Results: The Patient Health Questionnaire-9 (PHQ-9) is a self-report measure that assesses symptoms and severity of depression over the course of the last two weeks. Ilham obtained a score of 2 suggesting minimal depression. Tucker finds the endorsed symptoms to be not difficult at all. Decreased interest 1  Down, depressed, hopeless 0  Altered sleeping 0  Tired, decreased energy 1  Change in appetite 0  Feeling bad or failure about yourself 0  Trouble concentrating 0  Moving slowly or fidgety/restless 0  Suicidal thoughts 0  PHQ-9 Score 2    The Generalized Anxiety Disorder-7 (GAD-7) is a brief self-report measure  that assesses symptoms of anxiety over the course of the last two weeks. Patrecia obtained a score of 0. Nervous, anxious, on edge  0  Control/stop worrying 0  Worrying too much- different things 0  Trouble relaxing 0  Restless 0  Easily annoyed or irritable 0  Afraid-awful might happen 0  GAD-7 Score 0   Interventions: A chart review was conducted prior to the clinical intake interview. The PHQ-9, and GAD-7 were verbally administered as well as a Mood and Food questionnaire to assess various behaviors related to emotional eating. Throughout session, empathic reflections and validation was provided. Continuing treatment with this provider was discussed and a treatment goal was established. Psychoeducation regarding emotional versus physical hunger was provided. Najat was sent a handout via e-mail to utilize between now and the next appointment to increase awareness of hunger patterns and subsequent eating. Yeny provided verbal consent during today's appointment for this provider to send the handout via e-mail.   Provisional DSM-5 Diagnosis: 311 (F32.8) Other Specified Depressive Disorder, Emotional Eating Behaviors  Plan: Afton appears able and willing to participate as evidenced by collaboration on a treatment goal, engagement in reciprocal conversation, and asking questions as needed for clarification. The next appointment will be scheduled in two weeks, which will be via [employer]. The following treatment goal was established: decrease emotional eating. Once this provider's office resumes in-person appointments and it is deemed appropriate, Dicie will be notified. For the aforementioned goal, Chinara can benefit from biweekly individual therapy sessions that are brief in duration for approximately four to six sessions. The treatment modality will be individual therapeutic services, including an eclectic therapeutic approach utilizing techniques from Cognitive Behavioral Therapy, Patient  Centered Therapy, Dialectical Behavior Therapy, Acceptance and Commitment Therapy, Interpersonal Therapy, and Cognitive Restructuring. Therapeutic approach will include various interventions as appropriate, such as validation, support, mindfulness, thought defusion, reframing, psychoeducation, values assessment, and role playing. This provider will regularly review the treatment plan and medical chart to keep informed of status changes. Deshawn expressed understanding and agreement with the initial treatment plan of care.

## 2018-10-21 ENCOUNTER — Ambulatory Visit (INDEPENDENT_AMBULATORY_CARE_PROVIDER_SITE_OTHER): Payer: BC Managed Care – PPO | Admitting: Family Medicine

## 2018-10-21 ENCOUNTER — Other Ambulatory Visit: Payer: Self-pay

## 2018-10-21 ENCOUNTER — Encounter (INDEPENDENT_AMBULATORY_CARE_PROVIDER_SITE_OTHER): Payer: Self-pay | Admitting: Family Medicine

## 2018-10-21 VITALS — BP 135/87 | HR 70 | Temp 98.1°F | Ht 66.0 in | Wt 240.0 lb

## 2018-10-21 DIAGNOSIS — Z9189 Other specified personal risk factors, not elsewhere classified: Secondary | ICD-10-CM

## 2018-10-21 DIAGNOSIS — E559 Vitamin D deficiency, unspecified: Secondary | ICD-10-CM | POA: Diagnosis not present

## 2018-10-21 DIAGNOSIS — R7303 Prediabetes: Secondary | ICD-10-CM | POA: Diagnosis not present

## 2018-10-21 DIAGNOSIS — Z6838 Body mass index (BMI) 38.0-38.9, adult: Secondary | ICD-10-CM

## 2018-10-22 NOTE — Progress Notes (Signed)
Office: [phone]  /  Fax: [phone]   HPI:   Chief Complaint: OBESITY Mackenzie Watson is here to discuss her progress with her obesity treatment plan. She is on the Category 1 plan and is following her eating plan approximately 90-95 % of the time. She states she is exercising 0 minutes 0 times per week. Mackenzie Watson found that she spent a good amount of money the first few weeks, secondary to stocking up on food on the meal plan. She notes some days she found it was hard to get everything in.  Her weight is 240 lb (108.9 kg) today and has had a weight loss of 9 pounds over a period of 2 weeks since her last visit. She has lost 9 lbs since starting treatment with us.  Vitamin D Deficiency Mackenzie Watson has a diagnosis of vitamin D deficiency. She is currently taking OTC Vit D 1,000 IU daily. She notes fatigue and denies nausea, vomiting or muscle weakness.  At risk for osteopenia and osteoporosis Mackenzie Watson is at higher risk of osteopenia and osteoporosis due to vitamin D deficiency.   Pre-Diabetes Mackenzie Watson has a diagnosis of pre-diabetes based on her elevated Hgb A1c of 5.8 and insulin of 16.2. She was informed this puts her at greater risk of developing diabetes. She is not taking metformin currently and notes carbohydrate cravings. She continues to work on diet and exercise to decrease risk of diabetes. She denies nausea or hypoglycemia.  ASSESSMENT AND PLAN:  Vitamin D deficiency - Plan: Vitamin D, Ergocalciferol, (DRISDOL) 1.25 MG (50000 UT) CAPS capsule  Prediabetes  At risk for osteoporosis  Class 2 severe obesity with serious comorbidity and body mass index (BMI) of 38.0 to 38.9 in adult, unspecified obesity type (HCC)  PLAN:  Vitamin D Deficiency Mackenzie Watson was informed that low vitamin D levels contributes to fatigue and are associated with obesity, breast, and colon cancer. Wells agrees to start prescription Vit D 50,000 IU every week #4 with no refills. She will follow up for routine  testing of vitamin D, at least 2-3 times per year. She was informed of the risk of over-replacement of vitamin D and agrees to not increase her dose unless she discusses this with us first. Mackenzie Watson agrees to follow up with our clinic in 2 weeks.  At risk for osteopenia and osteoporosis Mackenzie Watson was given extended (30 minutes) osteoporosis prevention counseling today. Mackenzie Watson is at risk for osteopenia and osteoporsis due to her vitamin D deficiency. She was encouraged to take her vitamin D and follow her higher calcium diet and increase strengthening exercise to help strengthen her bones and decrease her risk of osteopenia and osteoporosis.  Pre-Diabetes Mackenzie Watson will continue to work on weight loss, exercise, and decreasing simple carbohydrates in her diet to help decrease the risk of diabetes. We dicussed metformin including benefits and risks. She was informed that eating too many simple carbohydrates or too many calories at one sitting increases the likelihood of GI side effects. Grant declined metformin for now and a prescription was not written today. We will repeat labs in 3 months. Jameeka agrees to follow up with our clinic in 2 weeks as directed to monitor her progress.  Obesity Mackenzie Watson is currently in the action stage of change. As such, her goal is to continue with weight loss efforts She has agreed to follow the Category 2 plan, follow the Pescatarian eating plan or follow our protein rich vegetarian plan Mackenzie Watson has been instructed to work up to a goal of 150 minutes  of combined cardio and strengthening exercise per week for weight loss and overall health benefits. We discussed the following Behavioral Modification Strategies today: increasing lean protein intake, increasing vegetables and work on meal planning and easy cooking plans, keeping healthy foods in the home, and planning for success   Mackenzie Watson has agreed to follow up with our clinic in 2 weeks. She was informed of the  importance of frequent follow up visits to maximize her success with intensive lifestyle modifications for her multiple health conditions.  ALLERGIES: No Known Allergies  MEDICATIONS: Current Outpatient Medications on File Prior to Visit  Medication Sig Dispense Refill  . Cholecalciferol (VITAMIN D3) 25 MCG (1000 UT) CAPS Take 1,000 Units by mouth.    . COLLAGEN PO Take by mouth. 1 tblspn    . hydrochlorothiazide (MICROZIDE) 12.5 MG capsule Take 12.5 mg by mouth daily.    . meloxicam (MOBIC) 7.5 MG tablet Take 7.5 mg by mouth daily.    . Multiple Vitamin (MULTIVITAMIN WITH MINERALS) TABS tablet Take 1 tablet by mouth daily.    . norethindrone (MICRONOR) 0.35 MG tablet Take 1 tablet by mouth daily.     No current facility-administered medications on file prior to visit.     PAST MEDICAL HISTORY: Past Medical History:  Diagnosis Date  . Gallbladder problem   . High blood pressure   . Swollen ankles   . Vitamin D deficiency     PAST SURGICAL HISTORY: Past Surgical History:  Procedure Laterality Date  . CHOLECYSTECTOMY      SOCIAL HISTORY: Social History   Tobacco Use  . Smoking status: Never Smoker  . Smokeless tobacco: Never Used  Substance Use Topics  . Alcohol use: Not on file  . Drug use: Not on file    FAMILY HISTORY: Family History  Problem Relation Age of Onset  . Heart disease Mother   . Sudden death Mother   . High blood pressure Father   . Stroke Father     ROS: Review of Systems  Constitutional: Positive for malaise/fatigue and weight loss.  Gastrointestinal: Negative for nausea and vomiting.  Musculoskeletal:       Negative muscle weakness  Endo/Heme/Allergies:       Negative hypoglycemia    PHYSICAL EXAM: Blood pressure 135/87, pulse 70, temperature 98.1 F (36.7 C), height 5\' 6"  (1.676 m), weight 240 lb (108.9 kg), SpO2 97 %. Body mass index is 38.74 kg/m. Physical Exam Vitals signs reviewed.  Constitutional:      Appearance: Normal  appearance. She is obese.  Cardiovascular:     Rate and Rhythm: Normal rate.     Pulses: Normal pulses.  Pulmonary:     Effort: Pulmonary effort is normal.     Breath sounds: Normal breath sounds.  Musculoskeletal: Normal range of motion.  Skin:    General: Skin is warm and dry.  Neurological:     Mental Status: She is alert and oriented to person, place, and time.  Psychiatric:        Mood and Affect: Mood normal.        Behavior: Behavior normal.     RECENT LABS AND TESTS: BMET    Component Value Date/Time   NA 142 10/07/2018 0000   K 4.2 10/07/2018 0000   CL 104 10/07/2018 0000   CO2 24 10/07/2018 0000   GLUCOSE 92 10/07/2018 0000   BUN 10 10/07/2018 0000   CREATININE 0.73 10/07/2018 0000   CALCIUM 9.6 10/07/2018 0000   GFRNONAA 98 10/07/2018  0000   GFRAA 113 10/07/2018 0000   Lab Results  Component Value Date   HGBA1C 5.8 (H) 10/07/2018   Lab Results  Component Value Date   INSULIN 16.2 10/07/2018   CBC    Component Value Date/Time   WBC 5.9 10/07/2018 0000   RBC 5.28 10/07/2018 0000   HGB 13.5 10/07/2018 0000   HCT 42.9 10/07/2018 0000   MCV 81 10/07/2018 0000   MCH 25.6 (L) 10/07/2018 0000   MCHC 31.5 10/07/2018 0000   RDW 13.6 10/07/2018 0000   LYMPHSABS 2.2 10/07/2018 0000   EOSABS 0.2 10/07/2018 0000   BASOSABS 0.0 10/07/2018 0000   Iron/TIBC/Ferritin/ %Sat No results found for: IRON, TIBC, FERRITIN, IRONPCTSAT Lipid Panel     Component Value Date/Time   CHOL 177 10/07/2018 0000   TRIG 78 10/07/2018 0000   HDL 54 10/07/2018 0000   LDLCALC 107 (H) 10/07/2018 0000   Hepatic Function Panel     Component Value Date/Time   PROT 7.6 10/07/2018 0000   ALBUMIN 4.3 10/07/2018 0000   AST 22 10/07/2018 0000   ALT 19 10/07/2018 0000   ALKPHOS 68 10/07/2018 0000   BILITOT 0.5 10/07/2018 0000      Component Value Date/Time   TSH 1.310 10/07/2018 0000      OBESITY BEHAVIORAL INTERVENTION VISIT  Today's visit was # 2   Starting weight:  249 lbs Starting date: 10/07/2018 Today's weight : 240 lbs  Today's date: 10/21/2018 Total lbs lost to date: 9    ASK: We discussed the diagnosis of obesity with Darrold Span today and Sharifa agreed to give [place] permission to discuss obesity behavioral modification therapy today.  ASSESS: Avery has the diagnosis of obesity and her BMI today is 9.76 Sahirah is in the action stage of change   ADVISE: Raphael was educated on the multiple health risks of obesity as well as the benefit of weight loss to improve her health. She was advised of the need for long term treatment and the importance of lifestyle modifications to improve her current health and to decrease her risk of future health problems.  AGREE: Multiple dietary modification options and treatment options were discussed and  Jasha agreed to follow the recommendations documented in the above note.  ARRANGE: Prakriti was educated on the importance of frequent visits to treat obesity as outlined per CMS and USPSTF guidelines and agreed to schedule her next follow up appointment today.  I, Trixie Dredge, am acting as transcriptionist for Ilene Qua, MD  I have reviewed the above documentation for accuracy and completeness, and I agree with the above. - Ilene Qua, MD

## 2018-10-24 ENCOUNTER — Encounter (INDEPENDENT_AMBULATORY_CARE_PROVIDER_SITE_OTHER): Payer: Self-pay | Admitting: Family Medicine

## 2018-10-27 MED ORDER — VITAMIN D (ERGOCALCIFEROL) 1.25 MG (50000 UNIT) PO CAPS: 50000.0000 [IU] | ORAL_CAPSULE | ORAL | 0 refills | Status: DC

## 2018-10-27 NOTE — Telephone Encounter (Signed)
Please advise 

## 2018-11-02 ENCOUNTER — Other Ambulatory Visit: Payer: Self-pay

## 2018-11-02 ENCOUNTER — Ambulatory Visit (INDEPENDENT_AMBULATORY_CARE_PROVIDER_SITE_OTHER): Payer: BC Managed Care – PPO | Admitting: Psychology

## 2018-11-02 DIAGNOSIS — F3289 Other specified depressive episodes: Secondary | ICD-10-CM | POA: Diagnosis not present

## 2018-11-02 NOTE — Progress Notes (Signed)
Office: [phone]  /  Fax: [phone]    Date: November 02, 2018    Appointment Start Time: 4:35pm Duration: 20 minutes Provider: Glennie Isle, Psy.D. Type of Session: Individual Therapy  Location of Patient: Home Location of Provider: Provider's Home Type of Contact: Telepsychological Visit via [employer] Web[employer]   Session Content: Mackenzie Watson is a 48 y.o. female presenting via [place] for a follow-up appointment to address the previously established treatment goal of decreasing emotional eating. Of note, this provider called Mackenzie Watson at 4:33pm as she did not present for today's appointment. She indicated she joined earlier but the appointment had not started. This provider explained she typically joins at the time of the appointment. As such, today's appointment was initiated 5 minutes late. Today's appointment was a telepsychological visit, as this provider's clinic is seeing a limited number of patients for in-person visits due to COVID-19. Therapeutic services will resume to in-person appointments once deemed appropriate. Mackenzie Watson expressed understanding regarding the rationale for telepsychological services, and provided verbal consent for today's appointment. Prior to proceeding with today's appointment, Mackenzie Watson's physical location at the time of this appointment was obtained. Lorin reported she was at home and provided the address. In the event of technical difficulties, Mackenzie Watson shared a phone number she could be reached at. Mackenzie Watson and this provider participated in today's telepsychological service. Also, Mackenzie Watson denied anyone else being present in the room or on the WebEx appointment.  This provider conducted a brief check-in and verbally administered the PHQ-9 and GAD-7. Mickie shared she continues to eat congruent to her meal plan and noted she lost 9 pounds. She shared she was worried about Father's Day; however, she indicated she did not engage in emotional eating. Since the last  appointment with this provider, Mackenzie Watson denied episodes of emotional eating. Regarding the handout previously shared by this provider, she noted, "I can relate. I can distinguish between what was given." Psychoeducation regarding triggers for emotional eating was provided. Mackenzie Watson was provided a handout, and encouraged to utilize the handout between now and the next appointment to increase awareness of triggers and frequency. Mackenzie Watson agreed. This provider also discussed behavioral strategies for specific triggers, such as placing the utensil down when conversing to avoid mindless eating. Verba provided verbal consent during today's appointment for this provider to send the handout with triggers via e-mail. Based on concerns related to finances and [name]'s reported progress, the next appointment will be scheduled in three weeks. Overall, Mackenzie Watson was receptive to today's session as evidenced by openness to sharing, responsiveness to feedback, and willingness to identify triggers for emotional eating.  Mental Status Examination:  Appearance: neat Behavior: cooperative Mood: euthymic Affect: mood congruent Speech: normal in rate, volume, and tone Eye Contact: appropriate Psychomotor Activity: appropriate Thought Process: linear, logical, and goal directed  Content/Perceptual Disturbances: denies suicidal and homicidal ideation, plan, and intent and no hallucinations, delusions, bizarre thinking or behavior reported or observed Orientation: time, person, place and purpose of appointment Cognition/Sensorium: memory, attention, language, and fund of knowledge intact  Insight: good Judgment: good  Structured Assessment Results: The Patient Health Questionnaire-9 (PHQ-9) is a self-report measure that assesses symptoms and severity of depression over the course of the last two weeks. Mackenzie Watson obtained a score of 0. Little interest or pleasure in doing things 0  Feeling down, depressed, or hopeless 0   Trouble falling or staying asleep, or sleeping too much 0  Feeling tired or having little energy 0  Poor appetite or overeating 0  Feeling bad about  yourself --- or that you are a failure or have let yourself or your family down 0  Trouble concentrating on things, such as reading the newspaper or watching television 0  Moving or speaking so slowly that other people could have noticed? Or the opposite --- being so fidgety or restless that you have been moving around a lot more than usual 0  Thoughts that you would be better off dead or hurting yourself in some way 0  PHQ-9 Score 0    The Generalized Anxiety Disorder-7 (GAD-7) is a brief self-report measure that assesses symptoms of anxiety over the course of the last two weeks. Mackenzie Watson obtained a score of 0. Feeling nervous, anxious, on edge 0  Not being able to stop or control worrying 0  Worrying too much about different things 0  Trouble relaxing 0  Being so restless that it's hard to sit still 0  Becoming easily annoyed or irritable 0  Feeling afraid as if something awful might happen 0  GAD-7 Score 0   Interventions:  Conducted a brief chart review Verbal administration of PHQ-9 and GAD-7 for symptom monitoring Provided empathic reflections and validation Reviewed content from the previous session Psychoeducation provided regarding triggers for emotional eating Provided positive reinforcement Focused on rapport building Employed supportive psychotherapy interventions to facilitate reduced distress, and to improve coping skills with identified stressors  DSM-5 Diagnosis: 311 (F32.8) Other Specified Depressive Disorder, Emotional Eating Behaviors  Treatment Goal & Progress: During the initial appointment with this provider, the following treatment goal was established: decrease emotional eating. Progress is limited, as Mackenzie Watson has just begun treatment with this provider; however, she is receptive to the interaction and  interventions and rapport is being established. Nevertheless, Mackenzie Watson has demonstrated some progress in her goal as evidenced by her self-report of not having any instances of emotional eating since the last appointment with this provider.   Plan: Mackenzie Watson continues to appear able and willing to participate as evidenced by engagement in reciprocal conversation, and asking questions for clarification as appropriate. The next appointment will be scheduled in three weeks, which will be via [employer] Web[employer]. Once this provider's office resumes in-person appointments and it is deemed appropriate, Mackenzie Watson will be notified. The next session will focus on reviewing triggers for emotional eating and the introduction of mindfulness.

## 2018-11-03 ENCOUNTER — Other Ambulatory Visit: Payer: Self-pay

## 2018-11-03 ENCOUNTER — Ambulatory Visit (INDEPENDENT_AMBULATORY_CARE_PROVIDER_SITE_OTHER): Payer: BC Managed Care – PPO | Admitting: Family Medicine

## 2018-11-03 ENCOUNTER — Encounter (INDEPENDENT_AMBULATORY_CARE_PROVIDER_SITE_OTHER): Payer: Self-pay | Admitting: Family Medicine

## 2018-11-03 VITALS — BP 131/92 | HR 95 | Temp 98.6°F | Ht 66.0 in | Wt 237.0 lb

## 2018-11-03 DIAGNOSIS — R7303 Prediabetes: Secondary | ICD-10-CM | POA: Diagnosis not present

## 2018-11-03 DIAGNOSIS — E782 Mixed hyperlipidemia: Secondary | ICD-10-CM | POA: Diagnosis not present

## 2018-11-03 DIAGNOSIS — Z6838 Body mass index (BMI) 38.0-38.9, adult: Secondary | ICD-10-CM | POA: Diagnosis not present

## 2018-11-04 NOTE — Progress Notes (Signed)
Office: [phone]  /  Fax: [phone]   HPI:   Chief Complaint: OBESITY Mackenzie Watson is here to discuss her progress with her obesity treatment plan. She is on the Category 2 plan, or follow the Pescatarian eating plan, or follow our protein rich vegetarian plan and is following her eating plan approximately 95 % of the time. She states she is walking and jumping rope for 30 minutes 3 times per week. Mackenzie Watson did the Vegetarian plan for the last 2 weeks, and she is thinking she wants to incorporate chicken and fish. She has been trying to increase her water consumption, but she knows it will be hard with school starting up in the fall.  Her weight is 237 lb (107.5 kg) today and has had a weight loss of 3 pounds over a period of 2 weeks since her last visit. She has lost 12 lbs since starting treatment with [place].  Hyperlipidemia Mackenzie Watson has hyperlipidemia and has been trying to improve her cholesterol levels with intensive lifestyle modification including a low saturated fat diet, exercise and weight loss. Last LDL was of 107 on FLP on 10/07/2018. She is not on statin and denies any chest pain, claudication or myalgias.  Pre-Diabetes Mackenzie Watson has a diagnosis of pre-diabetes based on her elevated Hgb A1c of 5.8 and was informed this puts her at greater risk of developing diabetes. She is not taking metformin currently and she denies persistent cravings. She continues to work on diet and exercise to decrease risk of diabetes. She denies nausea or hypoglycemia.  ASSESSMENT AND PLAN:  Mixed hyperlipidemia  Prediabetes  Class 2 severe obesity with serious comorbidity and body mass index (BMI) of 38.0 to 38.9 in adult, unspecified obesity type (Mackenzie Watson)  PLAN:  Hyperlipidemia Mackenzie Watson was informed of the American Heart Association Guidelines emphasizing intensive lifestyle modifications as the first line treatment for hyperlipidemia. We discussed many lifestyle modifications today in depth, and Mackenzie Watson  will continue to work on decreasing saturated fats such as fatty red meat, butter and many fried foods. She will also increase vegetables and lean protein in her diet and continue to work on exercise and weight loss efforts. We will repeat labs in 3 months. Mackenzie Watson agrees to follow up with our clinic in 2 weeks.  Pre-Diabetes Mackenzie Watson will continue to work on weight loss, exercise, and decreasing simple carbohydrates in her diet to help decrease the risk of diabetes. We dicussed metformin including benefits and risks. She was informed that eating too many simple carbohydrates or too many calories at one sitting increases the likelihood of GI side effects. Mackenzie Watson declined metformin for now and a prescription was not written today. We will repeat labs in 3 months. Mackenzie Watson agrees to follow up with our clinic in 2 weeks as directed to monitor her progress.  I spent > than 50% of the 15 minute visit on counseling as documented in the note.  Obesity Mackenzie Watson is currently in the action stage of change. As such, her goal is to continue with weight loss efforts She has agreed to follow the Pescatarian eating plan Mackenzie Watson has been instructed to work up to a goal of 150 minutes of combined cardio and strengthening exercise per week for weight loss and overall health benefits. We discussed the following Behavioral Modification Strategies today: increasing lean protein intake, increasing vegetables and work on meal planning and easy cooking plans, keeping healthy foods in the home, and planning for success   Mackenzie Watson has agreed to follow up with our clinic  in 2 weeks. She was informed of the importance of frequent follow up visits to maximize her success with intensive lifestyle modifications for her multiple health conditions.  ALLERGIES: No Known Allergies  MEDICATIONS: Current Outpatient Medications on File Prior to Visit  Medication Sig Dispense Refill  . COLLAGEN PO Take by mouth. 1 tblspn    .  hydrochlorothiazide (MICROZIDE) 12.5 MG capsule Take 12.5 mg by mouth daily.    . meloxicam (MOBIC) 7.5 MG tablet Take 7.5 mg by mouth daily.    . Multiple Vitamin (MULTIVITAMIN WITH MINERALS) TABS tablet Take 1 tablet by mouth daily.    . norethindrone (MICRONOR) 0.35 MG tablet Take 1 tablet by mouth daily.    . Vitamin D, Ergocalciferol, (DRISDOL) 1.25 MG (50000 UT) CAPS capsule Take 1 capsule (50,000 Units total) by mouth every 7 (seven) days. 4 capsule 0  . Cholecalciferol (VITAMIN D3) 25 MCG (1000 UT) CAPS Take 1,000 Units by mouth.     No current facility-administered medications on file prior to visit.     PAST MEDICAL HISTORY: Past Medical History:  Diagnosis Date  . Gallbladder problem   . High blood pressure   . Swollen ankles   . Vitamin D deficiency     PAST SURGICAL HISTORY: Past Surgical History:  Procedure Laterality Date  . CHOLECYSTECTOMY      SOCIAL HISTORY: Social History   Tobacco Use  . Smoking status: Never Smoker  . Smokeless tobacco: Never Used  Substance Use Topics  . Alcohol use: Not on file  . Drug use: Not on file    FAMILY HISTORY: Family History  Problem Relation Age of Onset  . Heart disease Mother   . Sudden death Mother   . High blood pressure Father   . Stroke Father     ROS: Review of Systems  Constitutional: Positive for weight loss.  Cardiovascular: Negative for chest pain and claudication.  Gastrointestinal: Negative for nausea.  Musculoskeletal: Negative for myalgias.  Endo/Heme/Allergies:       Negative hypoglycemia    PHYSICAL EXAM: Blood pressure (!) 131/92, pulse 95, temperature 98.6 F (37 C), temperature source Oral, height 5\' 6"  (1.676 m), weight 237 lb (107.5 kg), SpO2 97 %. Body mass index is 38.25 kg/m. Physical Exam Vitals signs reviewed.  Constitutional:      Appearance: Normal appearance. She is obese.  Cardiovascular:     Rate and Rhythm: Normal rate.     Pulses: Normal pulses.  Pulmonary:      Effort: Pulmonary effort is normal.     Breath sounds: Normal breath sounds.  Musculoskeletal: Normal range of motion.  Skin:    General: Skin is warm and dry.  Neurological:     Mental Status: She is alert and oriented to person, place, and time.  Psychiatric:        Mood and Affect: Mood normal.        Behavior: Behavior normal.     RECENT LABS AND TESTS: BMET    Component Value Date/Time   NA 142 10/07/2018 0000   K 4.2 10/07/2018 0000   CL 104 10/07/2018 0000   CO2 24 10/07/2018 0000   GLUCOSE 92 10/07/2018 0000   BUN 10 10/07/2018 0000   CREATININE 0.73 10/07/2018 0000   CALCIUM 9.6 10/07/2018 0000   GFRNONAA 98 10/07/2018 0000   GFRAA 113 10/07/2018 0000   Lab Results  Component Value Date   HGBA1C 5.8 (H) 10/07/2018   Lab Results  Component Value Date  INSULIN 16.2 10/07/2018   CBC    Component Value Date/Time   WBC 5.9 10/07/2018 0000   RBC 5.28 10/07/2018 0000   HGB 13.5 10/07/2018 0000   HCT 42.9 10/07/2018 0000   MCV 81 10/07/2018 0000   MCH 25.6 (L) 10/07/2018 0000   MCHC 31.5 10/07/2018 0000   RDW 13.6 10/07/2018 0000   LYMPHSABS 2.2 10/07/2018 0000   EOSABS 0.2 10/07/2018 0000   BASOSABS 0.0 10/07/2018 0000   Iron/TIBC/Ferritin/ %Sat No results found for: IRON, TIBC, FERRITIN, IRONPCTSAT Lipid Panel     Component Value Date/Time   CHOL 177 10/07/2018 0000   TRIG 78 10/07/2018 0000   HDL 54 10/07/2018 0000   LDLCALC 107 (H) 10/07/2018 0000   Hepatic Function Panel     Component Value Date/Time   PROT 7.6 10/07/2018 0000   ALBUMIN 4.3 10/07/2018 0000   AST 22 10/07/2018 0000   ALT 19 10/07/2018 0000   ALKPHOS 68 10/07/2018 0000   BILITOT 0.5 10/07/2018 0000      Component Value Date/Time   TSH 1.310 10/07/2018 0000      OBESITY BEHAVIORAL INTERVENTION VISIT  Today's visit was # 3   Starting weight: 249 lbs Starting date: 10/07/2018 Today's weight : 237 lbs Today's date: 11/03/2018 Total lbs lost to date: 12    ASK:  We discussed the diagnosis of obesity with Mackenzie Watson today and Mackenzie Watson agreed to give us permission to discuss obesity behavioral modification therapy today.  ASSESS: Mackenzie CellaDorothy has the diagnosis of obesity and her BMI today is 38.27 Mackenzie CellaDorothy is in the action stage of change   ADVISE: Mackenzie CellaDorothy was educated on the multiple health risks of obesity as well as the benefit of weight loss to improve her health. She was advised of the need for long term treatment and the importance of lifestyle modifications to improve her current health and to decrease her risk of future health problems.  AGREE: Multiple dietary modification options and treatment options were discussed and  Mackenzie Watson agreed to follow the recommendations documented in the above note.  ARRANGE: Mackenzie CellaDorothy was educated on the importance of frequent visits to treat obesity as outlined per CMS and USPSTF guidelines and agreed to schedule her next follow up appointment today.  I, Burt KnackSharon Martin, am acting as transcriptionist for Debbra RidingAlexandria Kadolph, MD  I have reviewed the above documentation for accuracy and completeness, and I agree with the above. - Debbra RidingAlexandria Kadolph, MD

## 2018-11-18 ENCOUNTER — Other Ambulatory Visit: Payer: Self-pay

## 2018-11-18 ENCOUNTER — Ambulatory Visit (INDEPENDENT_AMBULATORY_CARE_PROVIDER_SITE_OTHER): Payer: BC Managed Care – PPO | Admitting: Family Medicine

## 2018-11-18 ENCOUNTER — Encounter (INDEPENDENT_AMBULATORY_CARE_PROVIDER_SITE_OTHER): Payer: Self-pay | Admitting: Family Medicine

## 2018-11-18 VITALS — BP 148/84 | HR 84 | Temp 98.6°F | Ht 66.0 in | Wt 240.0 lb

## 2018-11-18 DIAGNOSIS — R7303 Prediabetes: Secondary | ICD-10-CM | POA: Diagnosis not present

## 2018-11-18 DIAGNOSIS — Z9189 Other specified personal risk factors, not elsewhere classified: Secondary | ICD-10-CM | POA: Diagnosis not present

## 2018-11-18 DIAGNOSIS — E559 Vitamin D deficiency, unspecified: Secondary | ICD-10-CM

## 2018-11-18 DIAGNOSIS — Z6838 Body mass index (BMI) 38.0-38.9, adult: Secondary | ICD-10-CM

## 2018-11-18 MED ORDER — VITAMIN D (ERGOCALCIFEROL) 1.25 MG (50000 UNIT) PO CAPS: 50000.0000 [IU] | ORAL_CAPSULE | ORAL | 0 refills | Status: DC

## 2018-11-18 NOTE — Progress Notes (Signed)
Office: [phone]  /  Fax: [phone]   HPI:   Chief Complaint: OBESITY Mackenzie Watson is here to discuss her progress with her obesity treatment plan. She is on the Pescatarian eating plan and is following her eating plan approximately 95 % of the time. She states she is lifting weight and jumping rope for 30 minutes 2 times per week. Mackenzie Watson has not been drinking enough water in the past few weeks. She splurged on a dessert on the 4th of July weekend. She would skip meals if she woke up later. She is trying to incorporate more physical activity by slowly adding it into her daily routine.  Her weight is 240 lb (108.9 kg) today and has gained 3 lbs since her last visit. She has lost 9 lbs since starting treatment with us.  Vitamin D Deficiency Mackenzie Watson has a diagnosis of vitamin D deficiency. She is currently taking prescription Vit D. She notes fatigue and denies nausea, vomiting or muscle weakness.  At risk for osteopenia and osteoporosis Mackenzie Watson is at higher risk of osteopenia and osteoporosis due to vitamin D deficiency.   Pre-Diabetes Mackenzie Watson has a diagnosis of pre-diabetes based on her elevated Hgb A1c and was informed this puts her at greater risk of developing diabetes. She notes carbohydrate cravings for dessert and she is not taking metformin currently and continues to work on diet and exercise to decrease risk of diabetes. She denies nausea or hypoglycemia.  ASSESSMENT AND PLAN:  Prediabetes  Vitamin D deficiency - Plan: Vitamin D, Ergocalciferol, (DRISDOL) 1.25 MG (50000 UT) CAPS capsule  At risk for osteoporosis  Class 2 severe obesity with serious comorbidity and body mass index (BMI) of 38.0 to 38.9 in adult, unspecified obesity type (HCC)  PLAN:  Vitamin D Deficiency Mackenzie Watson was informed that low vitamin D levels contributes to fatigue and are associated with obesity, breast, and colon cancer. [name] agrees to continue taking prescription Vit D 50,000 IU every week  #4 and we will refill for 1 month. She will follow up for routine testing of vitamin D, at least 2-3 times per year. She was informed of the risk of over-replacement of vitamin D and agrees to not increase her dose unless she discusses this with us first. Mackenzie Watson agrees to follow up with our clinic in 2 weeks.  At risk for osteopenia and osteoporosis Mackenzie Watson was given extended (15 minutes) osteoporosis prevention counseling today. Mackenzie Watson is at risk for osteopenia and osteoporsis due to her vitamin D deficiency. She was encouraged to take her vitamin D and follow her higher calcium diet and increase strengthening exercise to help strengthen her bones and decrease her risk of osteopenia and osteoporosis.  Pre-Diabetes Mackenzie Watson will continue to work on weight loss, exercise, and decreasing simple carbohydrates in her diet to help decrease the risk of diabetes. We dicussed metformin including benefits and risks. She was informed that eating too many simple carbohydrates or too many calories at one sitting increases the likelihood of GI side effects. [name] declined metformin for now and a prescription was not written today. We will repeat labs in 1 and 1/2 months. [name] agrees to follow up with our clinic in 2 weeks as directed to monitor her progress.  Obesity Mackenzie Watson is currently in the action stage of change. As such, her goal is to continue with weight loss efforts She has agreed to follow the Pescatarian eating plan or follow our protein rich vegetarian plan Mackenzie Watson has been instructed to work up to a goal of  150 minutes of combined cardio and strengthening exercise per week for weight loss and overall health benefits. We discussed the following Behavioral Modification Strategies today: increasing lean protein intake, increasing vegetables and work on meal planning and easy cooking plans, keeping healthy foods in the home, and planning for success   Mackenzie Watson has agreed to follow up with our  clinic in 2 weeks. She was informed of the importance of frequent follow up visits to maximize her success with intensive lifestyle modifications for her multiple health conditions.  ALLERGIES: No Known Allergies  MEDICATIONS: Current Outpatient Medications on File Prior to Visit  Medication Sig Dispense Refill  . COLLAGEN PO Take by mouth. 1 tblspn    . hydrochlorothiazide (MICROZIDE) 12.5 MG capsule Take 12.5 mg by mouth daily.    . meloxicam (MOBIC) 7.5 MG tablet Take 7.5 mg by mouth daily.    . Multiple Vitamin (MULTIVITAMIN WITH MINERALS) TABS tablet Take 1 tablet by mouth daily.    . norethindrone (MICRONOR) 0.35 MG tablet Take 1 tablet by mouth daily.    . Cholecalciferol (VITAMIN D3) 25 MCG (1000 UT) CAPS Take 1,000 Units by mouth.     No current facility-administered medications on file prior to visit.     PAST MEDICAL HISTORY: Past Medical History:  Diagnosis Date  . Gallbladder problem   . High blood pressure   . Swollen ankles   . Vitamin D deficiency     PAST SURGICAL HISTORY: Past Surgical History:  Procedure Laterality Date  . CHOLECYSTECTOMY      SOCIAL HISTORY: Social History   Tobacco Use  . Smoking status: Never Smoker  . Smokeless tobacco: Never Used  Substance Use Topics  . Alcohol use: Not on file  . Drug use: Not on file    FAMILY HISTORY: Family History  Problem Relation Age of Onset  . Heart disease Mother   . Sudden death Mother   . High blood pressure Father   . Stroke Father     ROS: Review of Systems  Constitutional: Positive for malaise/fatigue. Negative for weight loss.  Gastrointestinal: Negative for nausea and vomiting.  Musculoskeletal:       Negative muscle weakness  Endo/Heme/Allergies:       Negative hypoglycemia    PHYSICAL EXAM: Blood pressure (!) 148/84, pulse 84, temperature 98.6 F (37 C), temperature source Oral, height 5\' 6"  (1.676 m), weight 240 lb (108.9 kg), SpO2 97 %. Body mass index is 38.74 kg/m.  Physical Exam Vitals signs reviewed.  Constitutional:      Appearance: Normal appearance. She is obese.  Cardiovascular:     Rate and Rhythm: Normal rate.     Pulses: Normal pulses.  Pulmonary:     Effort: Pulmonary effort is normal.     Breath sounds: Normal breath sounds.  Musculoskeletal: Normal range of motion.  Skin:    General: Skin is warm and dry.  Neurological:     Mental Status: She is alert and oriented to person, place, and time.  Psychiatric:        Mood and Affect: Mood normal.        Behavior: Behavior normal.     RECENT LABS AND TESTS: BMET    Component Value Date/Time   NA 142 10/07/2018 0000   K 4.2 10/07/2018 0000   CL 104 10/07/2018 0000   CO2 24 10/07/2018 0000   GLUCOSE 92 10/07/2018 0000   BUN 10 10/07/2018 0000   CREATININE 0.73 10/07/2018 0000   CALCIUM 9.6  10/07/2018 0000   GFRNONAA 98 10/07/2018 0000   GFRAA 113 10/07/2018 0000   Lab Results  Component Value Date   HGBA1C 5.8 (H) 10/07/2018   Lab Results  Component Value Date   INSULIN 16.2 10/07/2018   CBC    Component Value Date/Time   WBC 5.9 10/07/2018 0000   RBC 5.28 10/07/2018 0000   HGB 13.5 10/07/2018 0000   HCT 42.9 10/07/2018 0000   MCV 81 10/07/2018 0000   MCH 25.6 (L) 10/07/2018 0000   MCHC 31.5 10/07/2018 0000   RDW 13.6 10/07/2018 0000   LYMPHSABS 2.2 10/07/2018 0000   EOSABS 0.2 10/07/2018 0000   BASOSABS 0.0 10/07/2018 0000   Iron/TIBC/Ferritin/ %Sat No results found for: IRON, TIBC, FERRITIN, IRONPCTSAT Lipid Panel     Component Value Date/Time   CHOL 177 10/07/2018 0000   TRIG 78 10/07/2018 0000   HDL 54 10/07/2018 0000   LDLCALC 107 (H) 10/07/2018 0000   Hepatic Function Panel     Component Value Date/Time   PROT 7.6 10/07/2018 0000   ALBUMIN 4.3 10/07/2018 0000   AST 22 10/07/2018 0000   ALT 19 10/07/2018 0000   ALKPHOS 68 10/07/2018 0000   BILITOT 0.5 10/07/2018 0000      Component Value Date/Time   TSH 1.310 10/07/2018 0000       OBESITY BEHAVIORAL INTERVENTION VISIT  Today's visit was # 4   Starting weight: 249 lbs Starting date: 10/07/2018 Today's weight : 240 lbs  Today's date: 11/18/2018 Total lbs lost to date: 9    ASK: We discussed the diagnosis of obesity with Mackenzie Watson today and Izetta agreed to give us permission to discuss obesity behavioral modification therapy today.  ASSESS: Mackenzie Watson has the diagnosis of obesity and her BMI today is 2638.76 Mackenzie Watson is in the action stage of change   ADVISE: Mackenzie Watson was educated on the multiple health risks of obesity as well as the benefit of weight loss to improve her health. She was advised of the need for long term treatment and the importance of lifestyle modifications to improve her current health and to decrease her risk of future health problems.  AGREE: Multiple dietary modification options and treatment options were discussed and  Aneth agreed to follow the recommendations documented in the above note.  ARRANGE: Mackenzie Watson was educated on the importance of frequent visits to treat obesity as outlined per CMS and USPSTF guidelines and agreed to schedule her next follow up appointment today.  I, Burt KnackSharon Martin, am acting as transcriptionist for Debbra RidingAlexandria Kadolph, MD  I have reviewed the above documentation for accuracy and completeness, and I agree with the above. - Debbra RidingAlexandria Kadolph, MD

## 2018-11-23 ENCOUNTER — Other Ambulatory Visit: Payer: Self-pay

## 2018-11-23 ENCOUNTER — Ambulatory Visit (INDEPENDENT_AMBULATORY_CARE_PROVIDER_SITE_OTHER): Payer: BC Managed Care – PPO | Admitting: Psychology

## 2018-11-23 DIAGNOSIS — F3289 Other specified depressive episodes: Secondary | ICD-10-CM

## 2018-11-23 NOTE — Progress Notes (Signed)
Office: [phone]  /  Fax: [phone]    Date: November 23, 2018    Appointment Start Time: 4:33pm Duration: 24 minutes Provider: Glennie Isle, Psy.D. Type of Session: Individual Therapy  Location of Patient: Home Location of Provider: Provider's Home Type of Contact: Telepsychological Visit via [employer] Web[employer]   Session Content: Mackenzie Watson is a 48 y.o. female presenting via [place] for a follow-up appointment to address the previously established treatment goal of decreasing emotional eating. Today's appointment was a telepsychological visit, as this provider's clinic is seeing a limited number of patients for in-person visits due to COVID-19. Therapeutic services will resume to in-person appointments once deemed appropriate. Mackenzie Watson expressed understanding regarding the rationale for telepsychological services, and provided verbal consent for today's appointment. Prior to proceeding with today's appointment, Mackenzie Watson's physical location at the time of this appointment was obtained. Mackenzie Watson reported she was at home and provided the address. In the event of technical difficulties, Mackenzie Watson shared a phone number she could be reached at. Mackenzie Watson and this provider participated in today's telepsychological service. Also, Mackenzie Watson denied anyone else being present in the room or on the WebEx appointment.  This provider conducted a brief check-in and verbally administered the PHQ-9 and GAD-7. Mackenzie Watson noted she continues to follow the structured meal plan. She indicated a goal to increase her water intake. Regarding emotional eating, she denied episodes of emotional eating. Moreover, psychoeducation regarding mindfulness was provided. A handout was provided to Mackenzie Watson with further information regarding mindfulness, including exercises. This provider also explained the benefit of mindfulness as it relates to emotional eating. Mackenzie Watson was encouraged to engage in the provided exercises between now and the next  appointment with this provider. Mackenzie Watson agreed. She was led through an exercise involving her senses. Mackenzie Watson provided verbal consent during today's appointment for this provider to send the handout on mindfulness via e-mail. Due to recent progress, frequency of appointments will be reduced. Mackenzie Watson was receptive to today's session as evidenced by openness to sharing, responsiveness to feedback, and willingness to engage in mindfulness exercises.    Mental Status Examination:  Appearance: neat Behavior: cooperative Mood: euthymic Affect: mood congruent Speech: normal in rate, volume, and tone Eye Contact: appropriate Psychomotor Activity: appropriate Thought Process: linear, logical, and goal directed  Content/Perceptual Disturbances: denies suicidal and homicidal ideation, plan, and intent and no hallucinations, delusions, bizarre thinking or behavior reported or observed Orientation: time, person, place and purpose of appointment Cognition/Sensorium: memory, attention, language, and fund of knowledge intact  Insight: good Judgment: good  Structured Assessment Results: The Patient Health Questionnaire-9 (PHQ-9) is a self-report measure that assesses symptoms and severity of depression over the course of the last two weeks. Mackenzie Watson obtained a score of 0. Little interest or pleasure in doing things 0  Feeling down, depressed, or hopeless 0  Trouble falling or staying asleep, or sleeping too much 0  Feeling tired or having little energy 0  Poor appetite or overeating 0  Feeling bad about yourself --- or that you are a failure or have let yourself or your family down 0  Trouble concentrating on things, such as reading the newspaper or watching television 0  Moving or speaking so slowly that other people could have noticed? Or the opposite --- being so fidgety or restless that you have been moving around a lot more than usual 0  Thoughts that you would be better off dead or hurting yourself in  some way 0  PHQ-9 Score 0    The Generalized Anxiety  Disorder-7 (GAD-7) is a brief self-report measure that assesses symptoms of anxiety over the course of the last two weeks. Mackenzie Watson obtained a score of 1 suggesting minimal anxiety. Mackenzie Watson finds the endorsed symptoms to be not difficult at all. Feeling nervous, anxious, on edge 1  Not being able to stop or control worrying 0  Worrying too much about different things 0  Trouble relaxing 0  Being so restless that it's hard to sit still 0  Becoming easily annoyed or irritable 0  Feeling afraid as if something awful might happen 0  GAD-7 Score 1   Interventions:  Conducted a brief chart review Verbal administration of PHQ-9 and GAD-7 for symptom monitoring Provided empathic reflections and validation Psychoeducation provided regarding mindfulness Engaged patient in a mindfulness exercise Employed supportive psychotherapy interventions to facilitate reduced distress, and to improve coping skills with identified stressors Employed acceptance and commitment interventions to emphasize mindfulness and acceptance without struggle  DSM-5 Diagnosis: 311 (F32.8) Other Specified Depressive Disorder, Emotional Eating Behaviors  Treatment Goal & Progress: During the initial appointment with this provider, the following treatment goal was established: decrease emotional eating. Mackenzie Watson has demonstrated progress in her goal as evidenced by increased awareness of hunger patterns and triggers for emotional eating. She also denied episodes of emotional eating since the last appointment with this provider.  Plan: Mackenzie Watson continues to appear able and willing to participate as evidenced by engagement in reciprocal conversation, and asking questions for clarification as appropriate. The next appointment will be scheduled in three weeks, which will be via [employer] Web[employer]. Once this provider's office resumes in-person appointments and it is deemed appropriate, Mackenzie Watson  will be notified. The next session will focus further on mindfulness.

## 2018-12-07 ENCOUNTER — Encounter (INDEPENDENT_AMBULATORY_CARE_PROVIDER_SITE_OTHER): Payer: Self-pay | Admitting: Family Medicine

## 2018-12-07 ENCOUNTER — Other Ambulatory Visit: Payer: Self-pay

## 2018-12-07 ENCOUNTER — Ambulatory Visit (INDEPENDENT_AMBULATORY_CARE_PROVIDER_SITE_OTHER): Payer: BC Managed Care – PPO | Admitting: Family Medicine

## 2018-12-07 VITALS — BP 148/87 | HR 77 | Temp 97.9°F | Ht 66.0 in | Wt 241.0 lb

## 2018-12-07 DIAGNOSIS — R7303 Prediabetes: Secondary | ICD-10-CM

## 2018-12-07 DIAGNOSIS — Z9189 Other specified personal risk factors, not elsewhere classified: Secondary | ICD-10-CM | POA: Diagnosis not present

## 2018-12-07 DIAGNOSIS — Z6839 Body mass index (BMI) 39.0-39.9, adult: Secondary | ICD-10-CM

## 2018-12-07 DIAGNOSIS — E559 Vitamin D deficiency, unspecified: Secondary | ICD-10-CM

## 2018-12-07 NOTE — Progress Notes (Signed)
Office: [phone]  /  Fax: [phone]   HPI:   Chief Complaint: OBESITY Mackenzie Watson is here to discuss her progress with her obesity treatment plan. She is on the Pescatarian eating plan and is following her eating plan approximately 85 % of the time. She states she is exercising 0 minutes 0 times per week. Mackenzie Watson has been really increasing her water intake (aiming for 1/2 weight in oz of water). She did recently get her hair done and has extra 3 lbs of hair currently. She is still enjoying the meal plan.  Her weight is 241 lb (109.3 kg) today and has gained 1 lb since her last visit. She has lost 8 lbs since starting treatment with Mackenzie Watson.  Vitamin D Deficiency Mackenzie Watson has a diagnosis of vitamin D deficiency. She is currently taking prescription Vit D. She notes fatigue and denies nausea, vomiting or muscle weakness.  At risk for osteopenia and osteoporosis Mackenzie Watson is at higher risk of osteopenia and osteoporosis due to vitamin D deficiency.   Pre-Diabetes Mackenzie Watson has a diagnosis of pre-diabetes based on her elevated Hgb A1c and was informed this puts her at greater risk of developing diabetes. Last Hgb A1c was of 5.8 and insulin of 16.2. She is not taking metformin currently and continues to work on diet and exercise to decrease risk of diabetes. She denies nausea or hypoglycemia.  ASSESSMENT AND PLAN:  Vitamin D deficiency - Plan: Vitamin D, Ergocalciferol, (DRISDOL) 1.25 MG (50000 UT) CAPS capsule  Prediabetes  At risk for osteoporosis  Class 2 severe obesity with serious comorbidity and body mass index (BMI) of 39.0 to 39.9 in adult, unspecified obesity type (HCC)  PLAN:  Vitamin D Deficiency Mackenzie Watson was informed that low vitamin D levels contributes to fatigue and are associated with obesity, breast, and colon cancer. Mackenzie Watson agrees to continue taking prescription Vit D 50,000 IU every week #12 with no refills. She will follow up for routine testing of vitamin D, at least 2-3  times per year. She was informed of the risk of over-replacement of vitamin D and agrees to not increase her dose unless she discusses this with Mackenzie Watson first. Mackenzie Watson agrees to follow up with our clinic in 2 weeks.  At risk for osteopenia and osteoporosis Mackenzie Watson was given extended (15 minutes) osteoporosis prevention counseling today. Mackenzie Watson is at risk for osteopenia and osteoporsis due to her vitamin D deficiency. She was encouraged to take her vitamin D and follow her higher calcium diet and increase strengthening exercise to help strengthen her bones and decrease her risk of osteopenia and osteoporosis.  Pre-Diabetes Mackenzie Watson will continue to work on weight loss, exercise, and decreasing simple carbohydrates in her diet to help decrease the risk of diabetes. We dicussed metformin including benefits and risks. She was informed that eating too many simple carbohydrates or too many calories at one sitting increases the likelihood of GI side effects. Mackenzie Watson declined metformin for now and a prescription was not written today. We will repeat labs in mid or late September. Mackenzie Watson agreed to follow up with Mackenzie Watson as directed to monitor her progress.  Obesity Mackenzie Watson is currently in the action stage of change. As such, her goal is to continue with weight loss efforts She has agreed to follow the Pescatarian eating plan Mackenzie Watson has been instructed to work up to a goal of 150 minutes of combined cardio and strengthening exercise per week or she would like to start walking for 10-15 minutes 2-3 times per week for weight loss  and overall health benefits. We discussed the following Behavioral Modification Strategies today: increasing lean protein intake, increasing vegetables and work on meal planning and easy cooking plans, keeping healthy foods in the home, and planning for success   Mackenzie Watson has agreed to follow up with our clinic in 2 weeks. She was informed of the importance of frequent follow up visits to  maximize her success with intensive lifestyle modifications for her multiple health conditions.  ALLERGIES: No Known Allergies  MEDICATIONS: Current Outpatient Medications on File Prior to Visit  Medication Sig Dispense Refill  . Cholecalciferol (VITAMIN D3) 25 MCG (1000 UT) CAPS Take 1,000 Units by mouth.    . COLLAGEN PO Take by mouth. 1 tblspn    . hydrochlorothiazide (MICROZIDE) 12.5 MG capsule Take 12.5 mg by mouth daily.    . meloxicam (MOBIC) 7.5 MG tablet Take 7.5 mg by mouth daily.    . Multiple Vitamin (MULTIVITAMIN WITH MINERALS) TABS tablet Take 1 tablet by mouth daily.    . norethindrone (MICRONOR) 0.35 MG tablet Take 1 tablet by mouth daily.    . Vitamin D, Ergocalciferol, (DRISDOL) 1.25 MG (50000 UT) CAPS capsule Take 1 capsule (50,000 Units total) by mouth every 7 (seven) days. 4 capsule 0   No current facility-administered medications on file prior to visit.     PAST MEDICAL HISTORY: Past Medical History:  Diagnosis Date  . Gallbladder problem   . High blood pressure   . Swollen ankles   . Vitamin D deficiency     PAST SURGICAL HISTORY: Past Surgical History:  Procedure Laterality Date  . CHOLECYSTECTOMY      SOCIAL HISTORY: Social History   Tobacco Use  . Smoking status: Never Smoker  . Smokeless tobacco: Never Used  Substance Use Topics  . Alcohol use: Not on file  . Drug use: Not on file    FAMILY HISTORY: Family History  Problem Relation Age of Onset  . Heart disease Mother   . Sudden death Mother   . High blood pressure Father   . Stroke Father     ROS: Review of Systems  Constitutional: Positive for malaise/fatigue. Negative for weight loss.  Gastrointestinal: Negative for nausea and vomiting.  Musculoskeletal:       Negative muscle weakness  Endo/Heme/Allergies:       Negative hypoglycemia    PHYSICAL EXAM: Blood pressure (!) 148/87, pulse 77, temperature 97.9 F (36.6 C), temperature source Oral, height 5\' 6"  (1.676 m), weight  241 lb (109.3 kg), SpO2 96 %. Body mass index is 38.9 kg/m. Physical Exam Vitals signs reviewed.  Constitutional:      Appearance: Normal appearance. She is obese.  Cardiovascular:     Rate and Rhythm: Normal rate.     Pulses: Normal pulses.  Pulmonary:     Effort: Pulmonary effort is normal.     Breath sounds: Normal breath sounds.  Musculoskeletal: Normal range of motion.  Skin:    General: Skin is warm and dry.  Neurological:     Mental Status: She is alert and oriented to person, place, and time.  Psychiatric:        Mood and Affect: Mood normal.        Behavior: Behavior normal.     RECENT LABS AND TESTS: BMET    Component Value Date/Time   NA 142 10/07/2018 0000   K 4.2 10/07/2018 0000   CL 104 10/07/2018 0000   CO2 24 10/07/2018 0000   GLUCOSE 92 10/07/2018 0000  BUN 10 10/07/2018 0000   CREATININE 0.73 10/07/2018 0000   CALCIUM 9.6 10/07/2018 0000   GFRNONAA 98 10/07/2018 0000   GFRAA 113 10/07/2018 0000   Lab Results  Component Value Date   HGBA1C 5.8 (H) 10/07/2018   Lab Results  Component Value Date   INSULIN 16.2 10/07/2018   CBC    Component Value Date/Time   WBC 5.9 10/07/2018 0000   RBC 5.28 10/07/2018 0000   HGB 13.5 10/07/2018 0000   HCT 42.9 10/07/2018 0000   MCV 81 10/07/2018 0000   MCH 25.6 (L) 10/07/2018 0000   MCHC 31.5 10/07/2018 0000   RDW 13.6 10/07/2018 0000   LYMPHSABS 2.2 10/07/2018 0000   EOSABS 0.2 10/07/2018 0000   BASOSABS 0.0 10/07/2018 0000   Iron/TIBC/Ferritin/ %Sat No results found for: IRON, TIBC, FERRITIN, IRONPCTSAT Lipid Panel     Component Value Date/Time   CHOL 177 10/07/2018 0000   TRIG 78 10/07/2018 0000   HDL 54 10/07/2018 0000   LDLCALC 107 (H) 10/07/2018 0000   Hepatic Function Panel     Component Value Date/Time   PROT 7.6 10/07/2018 0000   ALBUMIN 4.3 10/07/2018 0000   AST 22 10/07/2018 0000   ALT 19 10/07/2018 0000   ALKPHOS 68 10/07/2018 0000   BILITOT 0.5 10/07/2018 0000       Component Value Date/Time   TSH 1.310 10/07/2018 0000      OBESITY BEHAVIORAL INTERVENTION VISIT  Today's visit was # 5   Starting weight: 249 lbs Starting date: 10/07/2018 Today's weight : 241 lbs Today's date: 12/07/2018 Total lbs lost to date: 8    ASK: We discussed the diagnosis of obesity with Mackenzie Watson today and Sadeel agreed to give [place] permission to discuss obesity behavioral modification therapy today.  ASSESS: Gradie has the diagnosis of obesity and her BMI today is 44.92 Latara is in the action stage of change   ADVISE: Lyndy was educated on the multiple health risks of obesity as well as the benefit of weight loss to improve her health. She was advised of the need for long term treatment and the importance of lifestyle modifications to improve her current health and to decrease her risk of future health problems.  AGREE: Multiple dietary modification options and treatment options were discussed and  Alonie agreed to follow the recommendations documented in the above note.  ARRANGE: Jaclene was educated on the importance of frequent visits to treat obesity as outlined per CMS and USPSTF guidelines and agreed to schedule her next follow up appointment today.  I, Trixie Dredge, am acting as transcriptionist for Ilene Qua, MD  I have reviewed the above documentation for accuracy and completeness, and I agree with the above. - Ilene Qua, MD

## 2018-12-09 NOTE — Progress Notes (Signed)
Office: [phone]  /  Fax: [phone]    Date: December 14, 2018   Appointment Start Time: 10:34am Duration: 27 minutes Provider: Lawerance CruelGaytri Powell Halbert, Psy.D. Type of Session: Individual Therapy  Location of Patient: Home Location of Provider: [facility] Type of Contact: Telepsychological Visit via [employer]   Session Content: Mackenzie Watson is a 48 y.o. female presenting via [employer] Web[employer] for a follow-up appointment to address the previously established treatment goal of decreasing emotional eating. Of note, this provider called Mackenzie Watson at 10:32am as she did not present for today's appointment. She explained she was in the process of joining. As such, today's appointment was initiated 4 minutes late. Today's appointment was a telepsychological visit, as this provider's clinic is seeing a limited number of patients for in-person visits due to COVID-19. Therapeutic services will resume to in-person appointments once deemed appropriate. Mackenzie Watson expressed understanding regarding the rationale for telepsychological services, and provided verbal consent for today's appointment. Prior to proceeding with today's appointment, Mackenzie Watson's physical location at the time of this appointment was obtained. Mackenzie Watson reported she was at home and provided the address. In the event of technical difficulties, Mackenzie Watson shared a phone number she could be reached at. Mackenzie Watson and this provider participated in today's telepsychological service. Also, Mackenzie Watson denied anyone else being present in the room or on the WebEx appointment.  This provider conducted a brief check-in and verbally administered the PHQ-9 and GAD-7. Mackenzie Watson shared, "Today is my first day back at work." She described feeling a "little overwhelmed." Validation was provided. Regarding eating, Mackenzie Watson acknowledged eating candy secondary to stress, but explained she utilized her snack calories. She denied deviations from the structured meal plan. Since  the onset of treatment with this provider, she indicated a reduction in emotional eating. Moreover, Mackenzie Watson reported engaging in the shared mindfulness exercises. More specifically, she stated she engaged in mindful walking. This was positively reinforced. She discussed mindfulness has helped her develop a schedule, meal prep, and set the intention for the day. This provider also discussed the utilization of YouTube for mindfulness exercises, specifically videos by Rhae HammockMark Williams. It was recommended she engage in one mindfulness exercise a day and the rationale behind frequent practice was provided. Obstacles/barriers that may impact her ability to engage in a mindfulness exercise a day was explored. She discussed her work schedule may pose an obstacle. Thus, this provider and Mackenzie Watson explored a specific time of the day that would be best for her to engage in a mindfulness exercise on a daily basis. 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  Mackenzie Watson was receptive to today's session as evidenced by openness to sharing, responsiveness to feedback, and willingness to engage in learned skills.  Mental Status Examination:  Appearance: neat Behavior: cooperative Mood: euthymic Affect: mood congruent Speech: normal in rate, volume, and tone Eye Contact: appropriate Psychomotor Activity: appropriate Thought Process: linear, logical, and goal directed  Content/Perceptual Disturbances: denies suicidal and homicidal ideation, plan, and intent and no hallucinations, delusions, bizarre thinking or behavior reported or observed Orientation: time, person, place and purpose of appointment Cognition/Sensorium: memory, attention, language, and fund of knowledge intact  Insight: good Judgment: good  Structured Assessment Results: The Patient Health Questionnaire-9  (PHQ-9) is a self-report measure that assesses symptoms and severity of depression over the course of the last two weeks. Mackenzie Watson obtained a score of 0 Little interest  or pleasure in doing things 0  Feeling down, depressed, or hopeless 0  Trouble falling or staying asleep, or sleeping too much 0  Feeling tired or having little energy 0  Poor appetite or overeating 0  Feeling bad about yourself --- or that you are a failure or have let yourself or your family down 0  Trouble concentrating on things, such as reading the newspaper or watching television 0  Moving or speaking so slowly that other people could have noticed? Or the opposite --- being so fidgety or restless that you have been moving around a lot more than usual 0  Thoughts that you would be better off dead or hurting yourself in some way 0  PHQ-9 Score 0    The Generalized Anxiety Disorder-7 (GAD-7) is a brief self-report measure that assesses symptoms of anxiety over the course of the last two weeks. Mackenzie Watson obtained a score of 0. Feeling nervous, anxious, on edge 0  Not being able to stop or control worrying 0  Worrying too much about different things 0  Trouble relaxing 0  Being so restless that it's hard to sit still 0  Becoming easily annoyed or irritable 0  Feeling afraid as if something awful might happen 0  GAD-7 Score 0   Interventions:  Conducted a brief chart review Verbal administration of PHQ-9 and GAD-7 for symptom monitoring Provided empathic reflections and validation Reviewed content from the previous session Provided positive reinforcement Employed supportive psychotherapy interventions to facilitate reduced distress, and to improve coping skills with identified stressors Further psychoeducation regarding mindfulness provided  DSM-5 Diagnosis: 311 (F32.8) Other Specified Depressive Disorder, Emotional Eating Behaviors  Treatment Goal & Progress: During the initial appointment with this provider, the  following treatment goal was established: decrease emotional eating. Mackenzie Watson has demonstrated progress in her goal as evidenced by increased awareness of hunger patterns and triggers for emotional eating. Since the onset of treatment with this provider, Mackenzie Watson noted a reduction in emotional eating. She also continues to demonstrate willingness to engage in learned skills.   Plan: Mackenzie Watson declined future appointments with this provider due to her schedule. She acknowledged understanding that she may request a follow-up appointment in the future as long as she is still a patient with the clinic. No further follow-up by this provider planned.

## 2018-12-14 ENCOUNTER — Other Ambulatory Visit: Payer: Self-pay

## 2018-12-14 ENCOUNTER — Ambulatory Visit (INDEPENDENT_AMBULATORY_CARE_PROVIDER_SITE_OTHER): Payer: BC Managed Care – PPO | Admitting: Psychology

## 2018-12-14 DIAGNOSIS — F3289 Other specified depressive episodes: Secondary | ICD-10-CM | POA: Diagnosis not present

## 2018-12-15 MED ORDER — VITAMIN D (ERGOCALCIFEROL) 1.25 MG (50000 UNIT) PO CAPS: 50000.0000 [IU] | ORAL_CAPSULE | ORAL | 0 refills | Status: DC

## 2018-12-28 ENCOUNTER — Ambulatory Visit (INDEPENDENT_AMBULATORY_CARE_PROVIDER_SITE_OTHER): Payer: BC Managed Care – PPO | Admitting: Family Medicine

## 2018-12-28 ENCOUNTER — Other Ambulatory Visit: Payer: Self-pay

## 2018-12-28 ENCOUNTER — Encounter (INDEPENDENT_AMBULATORY_CARE_PROVIDER_SITE_OTHER): Payer: Self-pay | Admitting: Family Medicine

## 2018-12-28 VITALS — BP 142/94 | HR 86 | Temp 98.0°F | Ht 66.0 in | Wt 241.0 lb

## 2018-12-28 DIAGNOSIS — Z9189 Other specified personal risk factors, not elsewhere classified: Secondary | ICD-10-CM | POA: Diagnosis not present

## 2018-12-28 DIAGNOSIS — R7303 Prediabetes: Secondary | ICD-10-CM

## 2018-12-28 DIAGNOSIS — I10 Essential (primary) hypertension: Secondary | ICD-10-CM | POA: Diagnosis not present

## 2018-12-28 DIAGNOSIS — Z6838 Body mass index (BMI) 38.0-38.9, adult: Secondary | ICD-10-CM

## 2018-12-28 MED ORDER — CHLORTHALIDONE 25 MG PO TABS: 12.5000 mg | ORAL_TABLET | Freq: Every day | ORAL | 0 refills | Status: DC

## 2018-12-29 NOTE — Progress Notes (Signed)
Office: [phone]  /  Fax: [phone]   HPI:   Chief Complaint: OBESITY Mackenzie Watson is here to discuss her progress with her obesity treatment plan. She is on the Pescatarian eating plan and is following her eating plan approximately 85-90% of the time. She states she is jumping rope and doing squats 30 minutes 2 times per week. Mackenzie Watson has gone back to school in the past week and it has been an adjustment. She has found there's been lots of troubleshooting. She has been doing a good amount of meal prep and planning and is enjoying the Pescatarian plan for the most part. Her weight is 241 lb (109.3 kg) today and has not lost weight since her last visit. She has lost 8 lbs since starting treatment with us.  Hypertension Mackenzie MillardDorothy Watson Masse is a 48 y.o. female with hypertension.  Mackenzie Watson Farish denies chest pain, chest pressure, or headache. She is working weight loss to help control her blood pressure with the goal of decreasing her risk of heart attack and stroke. Willodean's blood pressure is elevated at 142/94.  At risk for cardiovascular disease Mackenzie Watson is at a higher than average risk for cardiovascular disease due to obesity. She currently denies any chest pain.  Pre-Diabetes Mackenzie Watson has a diagnosis of prediabetes based on her elevated Hgb A1c and was informed this puts her at greater risk of developing diabetes. Last A1c was 5.8 on 10/07/2018 and insulin was 16.2. She continues to work on diet and exercise to decrease risk of diabetes. She denies nausea or hypoglycemia.  ASSESSMENT AND PLAN:  Essential hypertension  Prediabetes  At risk for heart disease  Class 2 severe obesity with serious comorbidity and body mass index (BMI) of 38.0 to 38.9 in adult, unspecified obesity type (HCC)  PLAN:  Hypertension We discussed sodium restriction, working on healthy weight loss, and a regular exercise program as the means to achieve improved blood pressure control. Zyia agreed with  this plan and agreed to follow up as directed. We will continue to monitor her blood pressure as well as her progress with the above lifestyle modifications. Mackenzie Watson will start chlorthalidone 12.5 mg PO daily #30 with 0 refills and will stop HCTZ. She agrees to follow-up with our clinic in 2 weeks and will watch for signs of hypotension as she continues her lifestyle modifications.  Cardiovascular risk counseling Mackenzie Watson was given extended (15 minutes) coronary artery disease prevention counseling today. She is 48 y.o. female and has risk factors for heart disease including obesity. We discussed intensive lifestyle modifications today with an emphasis on specific weight loss instructions and strategies. Pt was also informed of the importance of increasing exercise and decreasing saturated fats to help prevent heart disease.  Pre-Diabetes Mackenzie Watson will continue to work on weight loss, exercise, and decreasing simple carbohydrates in her diet to help decrease the risk of diabetes. We dicussed metformin including benefits and risks. She was informed that eating too many simple carbohydrates or too many calories at one sitting increases the likelihood of GI side effects. Mackenzie Watson will have labs retested in 3 months and follow-up with us as directed to monitor her progress.  Obesity Mackenzie Watson is currently in the action stage of change. As such, her goal is to continue with weight loss efforts. She has agreed to follow the Pescatarian eating plan. Mackenzie Watson has been instructed to increase her physical activity to 3 days per week for weight loss and overall health benefits. We discussed the following Behavioral Modification Strategies today:  increasing lean protein intake, increasing vegetables, work on meal planning and easy cooking plans, keeping healthy foods in the home, and planning for success.  Mackenzie Watson has agreed to follow-up with our clinic in 2 weeks. She was informed of the importance of frequent  follow-up visits to maximize her success with intensive lifestyle modifications for her multiple health conditions.  ALLERGIES: No Known Allergies  MEDICATIONS: Current Outpatient Medications on File Prior to Visit  Medication Sig Dispense Refill   COLLAGEN PO Take by mouth. 1 tblspn     hydrochlorothiazide (MICROZIDE) 12.5 MG capsule Take 12.5 mg by mouth daily.     meloxicam (MOBIC) 7.5 MG tablet Take 7.5 mg by mouth daily.     Multiple Vitamin (MULTIVITAMIN WITH MINERALS) TABS tablet Take 1 tablet by mouth daily.     norethindrone (MICRONOR) 0.35 MG tablet Take 1 tablet by mouth daily.     Vitamin D, Ergocalciferol, (DRISDOL) 1.25 MG (50000 UT) CAPS capsule Take 1 capsule (50,000 Units total) by mouth every 7 (seven) days. 12 capsule 0   Cholecalciferol (VITAMIN D3) 25 MCG (1000 UT) CAPS Take 1,000 Units by mouth.     No current facility-administered medications on file prior to visit.     PAST MEDICAL HISTORY: Past Medical History:  Diagnosis Date   Gallbladder problem    High blood pressure    Swollen ankles    Vitamin D deficiency     PAST SURGICAL HISTORY: Past Surgical History:  Procedure Laterality Date   CHOLECYSTECTOMY      SOCIAL HISTORY: Social History   Tobacco Use   Smoking status: Never Smoker   Smokeless tobacco: Never Used  Substance Use Topics   Alcohol use: Not on file   Drug use: Not on file    FAMILY HISTORY: Family History  Problem Relation Age of Onset   Heart disease Mother    Sudden death Mother    High blood pressure Father    Stroke Father    ROS: Review of Systems  Cardiovascular: Negative for chest pain.       Negative for chest pressure.  Neurological: Negative for headaches.   PHYSICAL EXAM: Blood pressure (!) 142/94, pulse 86, temperature 98 F (36.7 C), temperature source Oral, height 5\' 6"  (1.676 m), weight 241 lb (109.3 kg), SpO2 94 %. Body mass index is 38.9 kg/m. Physical Exam Vitals signs  reviewed.  Constitutional:      Appearance: Normal appearance. She is obese.  Cardiovascular:     Rate and Rhythm: Normal rate.     Pulses: Normal pulses.  Pulmonary:     Effort: Pulmonary effort is normal.     Breath sounds: Normal breath sounds.  Musculoskeletal: Normal range of motion.  Skin:    General: Skin is warm and dry.  Neurological:     Mental Status: She is alert and oriented to person, place, and time.  Psychiatric:        Behavior: Behavior normal.   RECENT LABS AND TESTS: BMET    Component Value Date/Time   NA 142 10/07/2018 0000   K 4.2 10/07/2018 0000   CL 104 10/07/2018 0000   CO2 24 10/07/2018 0000   GLUCOSE 92 10/07/2018 0000   BUN 10 10/07/2018 0000   CREATININE 0.73 10/07/2018 0000   CALCIUM 9.6 10/07/2018 0000   GFRNONAA 98 10/07/2018 0000   GFRAA 113 10/07/2018 0000   Lab Results  Component Value Date   HGBA1C 5.8 (H) 10/07/2018   Lab Results  Component  Value Date   INSULIN 16.2 10/07/2018   CBC    Component Value Date/Time   WBC 5.9 10/07/2018 0000   RBC 5.28 10/07/2018 0000   HGB 13.5 10/07/2018 0000   HCT 42.9 10/07/2018 0000   MCV 81 10/07/2018 0000   MCH 25.6 (Watson) 10/07/2018 0000   MCHC 31.5 10/07/2018 0000   RDW 13.6 10/07/2018 0000   LYMPHSABS 2.2 10/07/2018 0000   EOSABS 0.2 10/07/2018 0000   BASOSABS 0.0 10/07/2018 0000   Iron/TIBC/Ferritin/ %Sat No results found for: IRON, TIBC, FERRITIN, IRONPCTSAT Lipid Panel     Component Value Date/Time   CHOL 177 10/07/2018 0000   TRIG 78 10/07/2018 0000   HDL 54 10/07/2018 0000   LDLCALC 107 (H) 10/07/2018 0000   Hepatic Function Panel     Component Value Date/Time   PROT 7.6 10/07/2018 0000   ALBUMIN 4.3 10/07/2018 0000   AST 22 10/07/2018 0000   ALT 19 10/07/2018 0000   ALKPHOS 68 10/07/2018 0000   BILITOT 0.5 10/07/2018 0000      Component Value Date/Time   TSH 1.310 10/07/2018 0000   Results for Mackenzie MillardSTATON, Biviana Watson (MRN [id number]) as of 12/29/2018 12:42  Ref. Range  10/07/2018 00:00  Vitamin D, 25-Hydroxy Latest Ref Range: 30.0 - 100.0 ng/mL 16.9 (Watson)   OBESITY BEHAVIORAL INTERVENTION VISIT  Today's visit was #6   Starting weight: 249 lbs Starting date: 10/07/2018 Today's weight: 241 lbs  Today's date: 12/28/2018 Total lbs lost to date: 8    12/28/2018  Height 5\' 6"  (1.676 m)  Weight 241 lb (109.3 kg)  BMI (Calculated) 38.92  BLOOD PRESSURE - SYSTOLIC 142  BLOOD PRESSURE - DIASTOLIC 94   Body Fat % 43.1 %  Total Body Water (lbs) 98.8 lbs   ASK: We discussed the diagnosis of obesity with Mackenzie Watson Lastinger today and Zuley agreed to give us permission to discuss obesity behavioral modification therapy today.  ASSESS: Mackenzie Watson has the diagnosis of obesity and her BMI today is 38.9. Mackenzie Watson is in the action stage of change.   ADVISE: Mackenzie Watson was educated on the multiple health risks of obesity as well as the benefit of weight loss to improve her health. She was advised of the need for long term treatment and the importance of lifestyle modifications to improve her current health and to decrease her risk of future health problems.  AGREE: Multiple dietary modification options and treatment options were discussed and  Dashae agreed to follow the recommendations documented in the above note.  ARRANGE: Mackenzie Watson was educated on the importance of frequent visits to treat obesity as outlined per CMS and USPSTF guidelines and agreed to schedule her next follow up appointment today.  I, Marianna Paymentenise Haag, am acting as transcriptionist for Debbra RidingAlexandria Kadolph, MD  I have reviewed the above documentation for accuracy and completeness, and I agree with the above. - Debbra RidingAlexandria Kadolph, MD

## 2019-01-18 ENCOUNTER — Ambulatory Visit (INDEPENDENT_AMBULATORY_CARE_PROVIDER_SITE_OTHER): Payer: BC Managed Care – PPO | Admitting: Family Medicine

## 2019-01-18 ENCOUNTER — Other Ambulatory Visit: Payer: Self-pay

## 2019-01-18 VITALS — BP 134/82 | HR 70 | Temp 98.2°F | Ht 66.0 in | Wt 236.0 lb

## 2019-01-18 DIAGNOSIS — Z9189 Other specified personal risk factors, not elsewhere classified: Secondary | ICD-10-CM

## 2019-01-18 DIAGNOSIS — Z6838 Body mass index (BMI) 38.0-38.9, adult: Secondary | ICD-10-CM

## 2019-01-18 DIAGNOSIS — E559 Vitamin D deficiency, unspecified: Secondary | ICD-10-CM | POA: Diagnosis not present

## 2019-01-18 DIAGNOSIS — R7303 Prediabetes: Secondary | ICD-10-CM | POA: Diagnosis not present

## 2019-01-18 DIAGNOSIS — I10 Essential (primary) hypertension: Secondary | ICD-10-CM | POA: Diagnosis not present

## 2019-01-18 NOTE — Progress Notes (Signed)
Office: [phone]  /  Fax: [phone]   HPI:   Chief Complaint: OBESITY Mackenzie Watson is here to discuss her progress with her obesity treatment plan. She is on the Pescatarian eating plan and is following her eating plan approximately 100 % of the time. She states she is walking and jumping rope 2 times per week. Mackenzie Watson has really enjoyed the [employer] and has been meal prepping on the weekends. She denies hunger. She is drinking a significant amount of water. She is doing mostly the seafood options.  Her weight is 236 lb (107 kg) today and has had a weight loss of 5 pounds over a period of 3 weeks since her last visit. She has lost 13 lbs since starting treatment with [place].  Pre-Diabetes Mackenzie Watson has a diagnosis of pre-diabetes based on her elevated Hgb A1c and was informed this puts her at greater risk of developing diabetes. Last Hgb A1c was of 5.8 and insulin of 16.2. She is not taking metformin currently and continues to work on diet and exercise to decrease risk of diabetes. She denies hypoglycemia.  At risk for diabetes Mackenzie Watson is at higher than average risk for developing diabetes due to her obesity and pre-diabetes. She currently denies polyuria or polydipsia.  Hypertension Mackenzie Watson is a 48 y.o. female with hypertension. Zia's blood pressure is well controlled with transit from hydrochlorothiazide to chlorthalidone. She denies chest pain, chest pressure, or headaches. She is working on weight loss to help control her blood pressure with the goal of decreasing her risk of heart attack and stroke.  Vitamin D Deficiency Mackenzie Watson has a diagnosis of vitamin D deficiency. She is currently taking prescription Vit D. She notes fatigue and denies nausea, vomiting or muscle weakness.  ASSESSMENT AND PLAN:  Prediabetes - Plan: Comprehensive metabolic panel, Hemoglobin A1c, Insulin, random, Lipid Panel With LDL/HDL Ratio  Essential hypertension - Plan: Lipid Panel With  LDL/HDL Ratio  Vitamin D deficiency - Plan: VITAMIN D 25 Hydroxy (Vit-D Deficiency, Fractures)  At risk for diabetes mellitus  Class 2 severe obesity with serious comorbidity and body mass index (BMI) of 38.0 to 38.9 in adult, unspecified obesity type [facility])  PLAN:  Pre-Diabetes Mackenzie Watson will continue to work on weight loss, exercise, and decreasing simple carbohydrates in her diet to help decrease the risk of diabetes. We dicussed metformin including benefits and risks. She was informed that eating too many simple carbohydrates or too many calories at one sitting increases the likelihood of GI side effects. We will check Hgb A1c and insulin today. Mackenzie Watson agrees to follow up with our clinic in 2 weeks as directed to monitor her progress.  Diabetes risk counseling Mackenzie Watson was given extended (15 minutes) diabetes prevention counseling today. She is 48 y.o. female and has risk factors for diabetes including obesity and pre-diabetes. We discussed intensive lifestyle modifications today with an emphasis on weight loss as well as increasing exercise and decreasing simple carbohydrates in her diet.  Hypertension We discussed sodium restriction, working on healthy weight loss, and a regular exercise program as the means to achieve improved blood pressure control. Mackenzie Watson agreed with this plan and agreed to follow up as directed. We will continue to monitor her blood pressure as well as her progress with the above lifestyle modifications. Mackenzie Watson agrees to continue her current medications and will watch for signs of hypotension as she continues her lifestyle modifications. We will check CMP and FLP today. Mackenzie Watson agrees to follow up with our clinic in 2  weeks.  Vitamin D Deficiency Mackenzie Watson was informed that low vitamin D levels contributes to fatigue and are associated with obesity, breast, and colon cancer. Mackenzie Watson agrees to continue taking prescription Vit D 50,000 IU every week and will follow up for  routine testing of vitamin D, at least 2-3 times per year. She was informed of the risk of over-replacement of vitamin D and agrees to not increase her dose unless she discusses this with Mackenzie Watson first. We will check Vit D level today. Mackenzie Watson agrees to follow up with our clinic in 2 weeks.  Obesity Mackenzie Watson is currently in the action stage of change. As such, her goal is to continue with weight loss efforts She has agreed to follow the Pescatarian eating plan Mackenzie Watson has been instructed to work up to a goal of 150 minutes of combined cardio and strengthening exercise per week for weight loss and overall health benefits. We discussed the following Behavioral Modification Strategies today: increasing lean protein intake, increasing vegetables and work on meal planning and easy cooking plans, keeping healthy foods in the home, and planning for success   Mackenzie Watson has agreed to follow up with our clinic in 2 weeks. She was informed of the importance of frequent follow up visits to maximize her success with intensive lifestyle modifications for her multiple health conditions.  ALLERGIES: No Known Allergies  MEDICATIONS: Current Outpatient Medications on File Prior to Visit  Medication Sig Dispense Refill  . chlorthalidone (HYGROTON) 25 MG tablet Take 0.5 tablets (12.5 mg total) by mouth daily. 30 tablet 0  . Cholecalciferol (VITAMIN D3) 25 MCG (1000 UT) CAPS Take 1,000 Units by mouth.    . COLLAGEN PO Take by mouth. 1 tblspn    . meloxicam (MOBIC) 7.5 MG tablet Take 7.5 mg by mouth daily.    . Multiple Vitamin (MULTIVITAMIN WITH MINERALS) TABS tablet Take 1 tablet by mouth daily.    . norethindrone (MICRONOR) 0.35 MG tablet Take 1 tablet by mouth daily.    . Vitamin D, Ergocalciferol, (DRISDOL) 1.25 MG (50000 UT) CAPS capsule Take 1 capsule (50,000 Units total) by mouth every 7 (seven) days. 12 capsule 0   No current facility-administered medications on file prior to visit.     PAST MEDICAL HISTORY:  Past Medical History:  Diagnosis Date  . Gallbladder problem   . High blood pressure   . Swollen ankles   . Vitamin D deficiency     PAST SURGICAL HISTORY: Past Surgical History:  Procedure Laterality Date  . CHOLECYSTECTOMY      SOCIAL HISTORY: Social History   Tobacco Use  . Smoking status: Never Smoker  . Smokeless tobacco: Never Used  Substance Use Topics  . Alcohol use: Not on file  . Drug use: Not on file    FAMILY HISTORY: Family History  Problem Relation Age of Onset  . Heart disease Mother   . Sudden death Mother   . High blood pressure Father   . Stroke Father     ROS: Review of Systems  Constitutional: Positive for weight loss.  Cardiovascular: Negative for chest pain.       Negative chest pressure  Genitourinary: Negative for frequency.  Neurological: Negative for headaches.  Endo/Heme/Allergies: Negative for polydipsia.       Negative hypoglycemia    PHYSICAL EXAM: Blood pressure 134/82, pulse 70, temperature 98.2 F (36.8 C), temperature source Oral, height 5\' 6"  (1.676 m), weight 236 lb (107 kg), SpO2 100 %. Body mass index is 38.09 kg/m. Physical  Exam Vitals signs reviewed.  Constitutional:      Appearance: Normal appearance. She is obese.  Cardiovascular:     Rate and Rhythm: Normal rate.     Pulses: Normal pulses.  Pulmonary:     Effort: Pulmonary effort is normal.     Breath sounds: Normal breath sounds.  Musculoskeletal: Normal range of motion.  Skin:    General: Skin is warm and dry.  Neurological:     Mental Status: She is alert and oriented to person, place, and time.  Psychiatric:        Mood and Affect: Mood normal.        Behavior: Behavior normal.     RECENT LABS AND TESTS: BMET    Component Value Date/Time   NA 142 10/07/2018 0000   K 4.2 10/07/2018 0000   CL 104 10/07/2018 0000   CO2 24 10/07/2018 0000   GLUCOSE 92 10/07/2018 0000   BUN 10 10/07/2018 0000   CREATININE 0.73 10/07/2018 0000   CALCIUM 9.6  10/07/2018 0000   GFRNONAA 98 10/07/2018 0000   GFRAA 113 10/07/2018 0000   Lab Results  Component Value Date   HGBA1C 5.8 (H) 10/07/2018   Lab Results  Component Value Date   INSULIN 16.2 10/07/2018   CBC    Component Value Date/Time   WBC 5.9 10/07/2018 0000   RBC 5.28 10/07/2018 0000   HGB 13.5 10/07/2018 0000   HCT 42.9 10/07/2018 0000   MCV 81 10/07/2018 0000   MCH 25.6 (L) 10/07/2018 0000   MCHC 31.5 10/07/2018 0000   RDW 13.6 10/07/2018 0000   LYMPHSABS 2.2 10/07/2018 0000   EOSABS 0.2 10/07/2018 0000   BASOSABS 0.0 10/07/2018 0000   Iron/TIBC/Ferritin/ %Sat No results found for: IRON, TIBC, FERRITIN, IRONPCTSAT Lipid Panel     Component Value Date/Time   CHOL 177 10/07/2018 0000   TRIG 78 10/07/2018 0000   HDL 54 10/07/2018 0000   LDLCALC 107 (H) 10/07/2018 0000   Hepatic Function Panel     Component Value Date/Time   PROT 7.6 10/07/2018 0000   ALBUMIN 4.3 10/07/2018 0000   AST 22 10/07/2018 0000   ALT 19 10/07/2018 0000   ALKPHOS 68 10/07/2018 0000   BILITOT 0.5 10/07/2018 0000      Component Value Date/Time   TSH 1.310 10/07/2018 0000      OBESITY BEHAVIORAL INTERVENTION VISIT  Today's visit was # 7   Starting weight: 249 lbs Starting date: 10/07/2018 Today's weight : 236 lbs Today's date: 01/18/2019 Total lbs lost to date: 13    ASK: We discussed the diagnosis of obesity with Lona Millardorothy L Gartland today and Jordynne agreed to give Mackenzie Watson permission to discuss obesity behavioral modification therapy today.  ASSESS: Mackenzie Watson has the diagnosis of obesity and her BMI today is 38.11 Mackenzie Watson is in the action stage of change   ADVISE: Mackenzie Watson was educated on the multiple health risks of obesity as well as the benefit of weight loss to improve her health. She was advised of the need for long term treatment and the importance of lifestyle modifications to improve her current health and to decrease her risk of future health problems.  AGREE: Multiple  dietary modification options and treatment options were discussed and  Ryli agreed to follow the recommendations documented in the above note.  ARRANGE: Mackenzie Watson was educated on the importance of frequent visits to treat obesity as outlined per CMS and USPSTF guidelines and agreed to schedule her next follow up appointment  today.  I, Trixie Dredge, am acting as transcriptionist for Ilene Qua, MD  I have reviewed the above documentation for accuracy and completeness, and I agree with the above. - Ilene Qua, MD

## 2019-01-27 LAB — COMPREHENSIVE METABOLIC PANEL
ALT: 10 IU/L (ref 0–32)
AST: 14 IU/L (ref 0–40)
Albumin/Globulin Ratio: 1.1 — ABNORMAL LOW (ref 1.2–2.2)
Albumin: 4 g/dL (ref 3.8–4.8)
Alkaline Phosphatase: 79 IU/L (ref 39–117)
BUN/Creatinine Ratio: 7 — ABNORMAL LOW (ref 9–23)
BUN: 7 mg/dL (ref 6–24)
Bilirubin Total: 0.3 mg/dL (ref 0.0–1.2)
CO2: 24 mmol/L (ref 20–29)
Calcium: 9.7 mg/dL (ref 8.7–10.2)
Chloride: 103 mmol/L (ref 96–106)
Creatinine, Ser: 0.97 mg/dL (ref 0.57–1.00)
GFR calc Af Amer: 80 mL/min/{1.73_m2} (ref >59)
GFR calc non Af Amer: 70 mL/min/{1.73_m2} (ref >59)
Globulin, Total: 3.5 g/dL (ref 1.5–4.5)
Glucose: 94 mg/dL (ref 65–99)
Potassium: 3.8 mmol/L (ref 3.5–5.2)
Sodium: 140 mmol/L (ref 134–144)
Total Protein: 7.5 g/dL (ref 6.0–8.5)

## 2019-01-27 LAB — LIPID PANEL WITH LDL/HDL RATIO
Cholesterol, Total: 143 mg/dL (ref 100–199)
HDL: 44 mg/dL
LDL Chol Calc (NIH): 83 mg/dL (ref 0–99)
LDL/HDL Ratio: 1.9 ratio (ref 0.0–3.2)
Triglycerides: 80 mg/dL (ref 0–149)
VLDL Cholesterol Cal: 16 mg/dL (ref 5–40)

## 2019-01-27 LAB — HEMOGLOBIN A1C
Est. average glucose Bld gHb Est-mCnc: 117 mg/dL
Hgb A1c MFr Bld: 5.7 % — ABNORMAL HIGH (ref 4.8–5.6)

## 2019-01-27 LAB — VITAMIN D 25 HYDROXY (VIT D DEFICIENCY, FRACTURES): Vit D, 25-Hydroxy: 44.3 ng/mL (ref 30.0–100.0)

## 2019-01-27 LAB — INSULIN, RANDOM: INSULIN: 17.4 u[IU]/mL (ref 2.6–24.9)

## 2019-02-01 ENCOUNTER — Ambulatory Visit (INDEPENDENT_AMBULATORY_CARE_PROVIDER_SITE_OTHER): Payer: BC Managed Care – PPO | Admitting: Family Medicine

## 2019-02-08 ENCOUNTER — Ambulatory Visit (INDEPENDENT_AMBULATORY_CARE_PROVIDER_SITE_OTHER): Payer: BC Managed Care – PPO | Admitting: Family Medicine

## 2019-02-08 ENCOUNTER — Other Ambulatory Visit: Payer: Self-pay

## 2019-02-08 ENCOUNTER — Encounter (INDEPENDENT_AMBULATORY_CARE_PROVIDER_SITE_OTHER): Payer: Self-pay | Admitting: Family Medicine

## 2019-02-08 VITALS — BP 114/77 | HR 74 | Temp 99.3°F | Ht 66.0 in | Wt 235.0 lb

## 2019-02-08 DIAGNOSIS — E559 Vitamin D deficiency, unspecified: Secondary | ICD-10-CM

## 2019-02-08 DIAGNOSIS — I10 Essential (primary) hypertension: Secondary | ICD-10-CM | POA: Diagnosis not present

## 2019-02-08 DIAGNOSIS — Z9189 Other specified personal risk factors, not elsewhere classified: Secondary | ICD-10-CM

## 2019-02-08 DIAGNOSIS — Z6838 Body mass index (BMI) 38.0-38.9, adult: Secondary | ICD-10-CM

## 2019-02-09 NOTE — Progress Notes (Signed)
Office: [phone]  /  Fax: [phone]   HPI:   Chief Complaint: OBESITY Mackenzie Watson is here to discuss her progress with her obesity treatment plan. She is on the Pescatarian eating plan and is following her eating plan approximately 90-95 % of the time. She states she is doing cardio and walking for 30 minutes 2 times per week. Mackenzie Watson has been very busy with working and going to school. She is trying to get in routine of waking up to exercise, but she doesn't have definitely cut off time to end work or school in the evening. She  Denies being hungry.  Her weight is 235 lb (106.6 kg) today and has had a weight loss of 1 pounds over a period of 3 weeks since her last visit. She has lost 14 lbs since starting treatment with us.  Vitamin D Deficiency Mackenzie Watson has a diagnosis of vitamin D deficiency. She is currently taking prescription Vit D. She notes fatigue and denies nausea, vomiting or muscle weakness.  At risk for osteopenia and osteoporosis Mackenzie Watson is at higher risk of osteopenia and osteoporosis due to vitamin D deficiency.   Hypertension Lona MillardDorothy L Champa is a 48 y.o. female with hypertension. Jalana's blood pressure was controlled today. She denies chest pain, chest pressure, or headaches. She is working on weight loss to help control her blood pressure with the goal of decreasing her risk of heart attack and stroke.   ASSESSMENT AND PLAN:  Vitamin D deficiency - Plan: Vitamin D, Ergocalciferol, (DRISDOL) 1.25 MG (50000 UT) CAPS capsule  Essential hypertension  At risk for osteoporosis  Class 2 severe obesity with serious comorbidity and body mass index (BMI) of 38.0 to 38.9 in adult, unspecified obesity type (HCC)  PLAN:  Vitamin D Deficiency Mackenzie Watson was informed that low vitamin D levels contributes to fatigue and are associated with obesity, breast, and colon cancer. Canary agrees to continue taking prescription Vit D 50,000 IU every week #4 and we will refill for 1  month. She will follow up for routine testing of vitamin D, at least 2-3 times per year. She was informed of the risk of over-replacement of vitamin D and agrees to not increase her dose unless she discusses this with us first. Mackenzie Watson agrees to follow up with our clinic in 2 weeks.  At risk for osteopenia and osteoporosis Mackenzie Watson was given extended (15 minutes) osteoporosis prevention counseling today. Mackenzie Watson is at risk for osteopenia and osteoporsis due to her vitamin D deficiency. She was encouraged to take her vitamin D and follow her higher calcium diet and increase strengthening exercise to help strengthen her bones and decrease her risk of osteopenia and osteoporosis.  Hypertension We discussed sodium restriction, working on healthy weight loss, and a regular exercise program as the means to achieve improved blood pressure control. Daquisha agreed with this plan and agreed to follow up as directed. We will continue to monitor her blood pressure as well as her progress with the above lifestyle modifications. Shelbe agrees to continue taking chlorthalidone, and will watch for signs of hypotension as she continues her lifestyle modifications. Sergio agrees to follow up with our clinic in 2 weeks.  Obesity Mackenzie Watson is currently in the action stage of change. As such, her goal is to continue with weight loss efforts She has agreed to keep a food journal with 400-500 calories and 35+ grams of protein at supper daily and follow the Pescatarian eating plan Mackenzie Watson has been instructed to work up to a goal  of 150 minutes of combined cardio and strengthening exercise per week for weight loss and overall health benefits. We discussed the following Behavioral Modification Strategies today: increasing lean protein intake, increasing vegetables and work on meal planning and easy cooking plans, keeping healthy foods in the home, and planning for success   Jannette has agreed to follow up with our clinic in 2  weeks. She was informed of the importance of frequent follow up visits to maximize her success with intensive lifestyle modifications for her multiple health conditions.  ALLERGIES: No Known Allergies  MEDICATIONS: Current Outpatient Medications on File Prior to Visit  Medication Sig Dispense Refill  . chlorthalidone (HYGROTON) 25 MG tablet Take 0.5 tablets (12.5 mg total) by mouth daily. 30 tablet 0  . COLLAGEN PO Take by mouth. 1 tblspn    . meloxicam (MOBIC) 7.5 MG tablet Take 7.5 mg by mouth daily.    . Multiple Vitamin (MULTIVITAMIN WITH MINERALS) TABS tablet Take 1 tablet by mouth daily.    . norethindrone (MICRONOR) 0.35 MG tablet Take 1 tablet by mouth daily.    . Vitamin D, Ergocalciferol, (DRISDOL) 1.25 MG (50000 UT) CAPS capsule Take 1 capsule (50,000 Units total) by mouth every 7 (seven) days. 12 capsule 0   No current facility-administered medications on file prior to visit.     PAST MEDICAL HISTORY: Past Medical History:  Diagnosis Date  . Gallbladder problem   . High blood pressure   . Swollen ankles   . Vitamin D deficiency     PAST SURGICAL HISTORY: Past Surgical History:  Procedure Laterality Date  . CHOLECYSTECTOMY      SOCIAL HISTORY: Social History   Tobacco Use  . Smoking status: Never Smoker  . Smokeless tobacco: Never Used  Substance Use Topics  . Alcohol use: Not on file  . Drug use: Not on file    FAMILY HISTORY: Family History  Problem Relation Age of Onset  . Heart disease Mother   . Sudden death Mother   . High blood pressure Father   . Stroke Father     ROS: Review of Systems  Constitutional: Positive for malaise/fatigue and weight loss.  Cardiovascular: Negative for chest pain.       Negative chest pressure  Gastrointestinal: Negative for nausea and vomiting.  Musculoskeletal:       Negative muscle weakness  Neurological: Negative for headaches.    PHYSICAL EXAM: Blood pressure 114/77, pulse 74, temperature 99.3 F (37.4  C), temperature source Oral, height 5\' 6"  (1.676 m), weight 235 lb (106.6 kg), SpO2 96 %. Body mass index is 37.93 kg/m. Physical Exam Vitals signs reviewed.  Constitutional:      Appearance: Normal appearance. She is obese.  Cardiovascular:     Rate and Rhythm: Normal rate.     Pulses: Normal pulses.  Pulmonary:     Effort: Pulmonary effort is normal.     Breath sounds: Normal breath sounds.  Musculoskeletal: Normal range of motion.  Skin:    General: Skin is warm and dry.  Neurological:     Mental Status: She is alert and oriented to person, place, and time.  Psychiatric:        Mood and Affect: Mood normal.        Behavior: Behavior normal.     RECENT LABS AND TESTS: BMET    Component Value Date/Time   NA 140 01/26/2019 0815   K 3.8 01/26/2019 0815   CL 103 01/26/2019 0815   CO2 24 01/26/2019  0815   GLUCOSE 94 01/26/2019 0815   BUN 7 01/26/2019 0815   CREATININE 0.97 01/26/2019 0815   CALCIUM 9.7 01/26/2019 0815   GFRNONAA 70 01/26/2019 0815   GFRAA 80 01/26/2019 0815   Lab Results  Component Value Date   HGBA1C 5.7 (H) 01/26/2019   HGBA1C 5.8 (H) 10/07/2018   Lab Results  Component Value Date   INSULIN 17.4 01/26/2019   INSULIN 16.2 10/07/2018   CBC    Component Value Date/Time   WBC 5.9 10/07/2018 0000   RBC 5.28 10/07/2018 0000   HGB 13.5 10/07/2018 0000   HCT 42.9 10/07/2018 0000   MCV 81 10/07/2018 0000   MCH 25.6 (L) 10/07/2018 0000   MCHC 31.5 10/07/2018 0000   RDW 13.6 10/07/2018 0000   LYMPHSABS 2.2 10/07/2018 0000   EOSABS 0.2 10/07/2018 0000   BASOSABS 0.0 10/07/2018 0000   Iron/TIBC/Ferritin/ %Sat No results found for: IRON, TIBC, FERRITIN, IRONPCTSAT Lipid Panel     Component Value Date/Time   CHOL 143 01/26/2019 0815   TRIG 80 01/26/2019 0815   HDL 44 01/26/2019 0815   LDLCALC 83 01/26/2019 0815   Hepatic Function Panel     Component Value Date/Time   PROT 7.5 01/26/2019 0815   ALBUMIN 4.0 01/26/2019 0815   AST 14  01/26/2019 0815   ALT 10 01/26/2019 0815   ALKPHOS 79 01/26/2019 0815   BILITOT 0.3 01/26/2019 0815      Component Value Date/Time   TSH 1.310 10/07/2018 0000      OBESITY BEHAVIORAL INTERVENTION VISIT  Today's visit was # 8   Starting weight: 249 lbs Starting date: 10/07/2018 Today's weight : 235 lbs  Today's date: 02/08/2019 Total lbs lost to date: 14    ASK: We discussed the diagnosis of obesity with Lona Millard today and Ambrielle agreed to give [place] permission to discuss obesity behavioral modification therapy today.  ASSESS: Chinaza has the diagnosis of obesity and her BMI today is 30.95 Freja is in the action stage of change   ADVISE: Amen was educated on the multiple health risks of obesity as well as the benefit of weight loss to improve her health. She was advised of the need for long term treatment and the importance of lifestyle modifications to improve her current health and to decrease her risk of future health problems.  AGREE: Multiple dietary modification options and treatment options were discussed and  Tammara agreed to follow the recommendations documented in the above note.  ARRANGE: Cathaleen was educated on the importance of frequent visits to treat obesity as outlined per CMS and USPSTF guidelines and agreed to schedule her next follow up appointment today.  I, Burt Knack, am acting as transcriptionist for Debbra Riding, MD  I have reviewed the above documentation for accuracy and completeness, and I agree with the above. - Debbra Riding, MD

## 2019-02-10 MED ORDER — VITAMIN D (ERGOCALCIFEROL) 1.25 MG (50000 UNIT) PO CAPS: 50000.0000 [IU] | ORAL_CAPSULE | ORAL | 0 refills | Status: DC

## 2019-02-16 ENCOUNTER — Other Ambulatory Visit (INDEPENDENT_AMBULATORY_CARE_PROVIDER_SITE_OTHER): Payer: Self-pay | Admitting: Family Medicine

## 2019-02-24 ENCOUNTER — Ambulatory Visit (INDEPENDENT_AMBULATORY_CARE_PROVIDER_SITE_OTHER): Payer: BC Managed Care – PPO | Admitting: Physician Assistant

## 2019-02-25 ENCOUNTER — Other Ambulatory Visit (INDEPENDENT_AMBULATORY_CARE_PROVIDER_SITE_OTHER): Payer: Self-pay | Admitting: Family Medicine

## 2019-03-10 ENCOUNTER — Encounter (INDEPENDENT_AMBULATORY_CARE_PROVIDER_SITE_OTHER): Payer: Self-pay

## 2019-03-11 ENCOUNTER — Other Ambulatory Visit: Payer: Self-pay

## 2019-03-11 ENCOUNTER — Encounter (INDEPENDENT_AMBULATORY_CARE_PROVIDER_SITE_OTHER): Payer: Self-pay | Admitting: Family Medicine

## 2019-03-11 ENCOUNTER — Ambulatory Visit (INDEPENDENT_AMBULATORY_CARE_PROVIDER_SITE_OTHER): Payer: BC Managed Care – PPO | Admitting: Family Medicine

## 2019-03-11 VITALS — BP 135/83 | HR 72 | Temp 98.4°F | Ht 66.0 in | Wt 237.0 lb

## 2019-03-11 DIAGNOSIS — R7303 Prediabetes: Secondary | ICD-10-CM

## 2019-03-11 DIAGNOSIS — E559 Vitamin D deficiency, unspecified: Secondary | ICD-10-CM | POA: Diagnosis not present

## 2019-03-11 DIAGNOSIS — Z6838 Body mass index (BMI) 38.0-38.9, adult: Secondary | ICD-10-CM

## 2019-03-11 DIAGNOSIS — Z9189 Other specified personal risk factors, not elsewhere classified: Secondary | ICD-10-CM | POA: Diagnosis not present

## 2019-03-11 MED ORDER — VITAMIN D (ERGOCALCIFEROL) 1.25 MG (50000 UNIT) PO CAPS: 50000.0000 [IU] | ORAL_CAPSULE | ORAL | 0 refills | Status: DC

## 2019-03-15 NOTE — Progress Notes (Signed)
Office: [phone]  /  Fax: [phone]   HPI:   Chief Complaint: OBESITY Mackenzie Watson is here to discuss her progress with her obesity treatment plan. She is on the Category 2 plan and the Pescatarian and meatless eating plan and she is following her eating plan approximately 80 % of the time. She states she is exercising 0 minutes 0 times per week. Mackenzie Watson is using the Pescatarian, Category 2 and vegetarian plan. She tends to snack too much in the evening because she does not always eat enough during the day. She feels journaling is an extra task that she does not have time for. She does snack off the plan on cheese and nuts. Leler feels that she does not have time for meal prep. Her weight is 237 lb (107.5 kg) today and has had a weight gain of 2 pounds over a period of 3 to 4 weeks since her last visit. She has lost 12 lbs since starting treatment with [place].  Vitamin D deficiency [name] has a diagnosis of vitamin D deficiency. Her last vitamin D level was at 44.3 on 01/26/19 and was not at goal. Mackenzie Watson is currently taking vit D and she denies nausea, vomiting or muscle weakness.  At risk for osteopenia and osteoporosis Mackenzie Watson is at higher risk of osteopenia and osteoporosis due to vitamin D deficiency.   Pre-Diabetes Mackenzie Watson has a diagnosis of prediabetes based on her elevated Hgb A1c and was informed this puts her at greater risk of developing diabetes. Mackenzie Watson has occasional polyphagia but she does skip meals at times. Her last A1c was at 5.7 on 01/26/19. Mackenzie Watson is not on metformin and she continues to work on diet and exercise to decrease risk of diabetes.   ASSESSMENT AND PLAN:  Vitamin D deficiency - Plan: Vitamin D, Ergocalciferol, (DRISDOL) 1.25 MG (50000 UT) CAPS capsule  Prediabetes  At risk for osteoporosis  Class 2 severe obesity with serious comorbidity and body mass index (BMI) of 38.0 to 38.9 in adult, unspecified obesity type (HCC)  PLAN:  Vitamin D Deficiency  [name] agrees to continue to take prescription Vit D @50 ,000 IU every week #4 with no refills and she will follow up for routine testing of vitamin D, at least 2-3 times per year. She was informed of the risk of over-replacement of vitamin D and agrees to not increase her dose unless she discusses this with first. [name] agrees to follow up with our clinic in 2 to 3 weeks.  At risk for osteopenia and osteoporosis [name] was given extended (15 minutes) osteoporosis prevention counseling today. Beauty is at risk for osteopenia and osteoporosis due to her vitamin D deficiency. She was encouraged to take her vitamin D and follow her higher calcium diet and increase strengthening exercise to help strengthen her bones and decrease her risk of osteopenia and osteoporosis.  Pre-Diabetes Mackenzie Watson will continue to work on weight loss, exercise, and decreasing simple carbohydrates in her diet to help decrease the risk of diabetes.Mackenzie Watson will continue with the meal plan and follow up with Mackenzie Watson as directed to monitor her progress.  Obesity Mackenzie Watson is currently in the action stage of change. As such, her goal is to continue with weight loss efforts She has agreed to keep a food journal with 1100 to 1200 calories and 80 grams of protein daily or follow the Category 2 plan, or follow the Pescatarian eating plan or follow our protein rich vegetarian plan We discussed the following Behavioral Modification Strategies today: planning  for success, no skipping meals, keep a strict food journal and increasing lean protein intake  Mackenzie Watson was also given the calorie/protein goals for all meals during the day. Handouts for journaling and eating our were provided to patient.  Mackenzie Watson has agreed to follow up with our clinic in 2 to 3 weeks. She was informed of the importance of frequent follow up visits to maximize her success with intensive lifestyle modifications for her multiple health conditions.  ALLERGIES: No  Known Allergies  MEDICATIONS: Current Outpatient Medications on File Prior to Visit  Medication Sig Dispense Refill  . chlorthalidone (HYGROTON) 25 MG tablet Take 1/2 (one-half) tablet by mouth once daily 30 tablet 0  . COLLAGEN PO Take by mouth. 1 tblspn    . meloxicam (MOBIC) 7.5 MG tablet Take 7.5 mg by mouth daily.    . Multiple Vitamin (MULTIVITAMIN WITH MINERALS) TABS tablet Take 1 tablet by mouth daily.    . norethindrone (MICRONOR) 0.35 MG tablet Take 1 tablet by mouth daily.     No current facility-administered medications on file prior to visit.     PAST MEDICAL HISTORY: Past Medical History:  Diagnosis Date  . Gallbladder problem   . High blood pressure   . Swollen ankles   . Vitamin D deficiency     PAST SURGICAL HISTORY: Past Surgical History:  Procedure Laterality Date  . CHOLECYSTECTOMY      SOCIAL HISTORY: Social History   Tobacco Use  . Smoking status: Never Smoker  . Smokeless tobacco: Never Used  Substance Use Topics  . Alcohol use: Not on file  . Drug use: Not on file    FAMILY HISTORY: Family History  Problem Relation Age of Onset  . Heart disease Mother   . Sudden death Mother   . High blood pressure Father   . Stroke Father     ROS: Review of Systems  Constitutional: Negative for weight loss.  Gastrointestinal: Negative for nausea and vomiting.  Musculoskeletal:       Negative for muscle weakness  Endo/Heme/Allergies:       Positive for polyphagia    PHYSICAL EXAM: Blood pressure 135/83, pulse 72, temperature 98.4 F (36.9 C), temperature source Oral, height 5\' 6"  (1.676 m), weight 237 lb (107.5 kg), last menstrual period 06/10/2018, SpO2 99 %. Body mass index is 38.25 kg/m. Physical Exam Vitals signs reviewed.  Constitutional:      Appearance: Normal appearance. She is well-developed. She is obese.  Cardiovascular:     Rate and Rhythm: Normal rate.  Pulmonary:     Effort: Pulmonary effort is normal.  Musculoskeletal:  Normal range of motion.  Skin:    General: Skin is warm and dry.  Neurological:     Mental Status: She is alert and oriented to person, place, and time.  Psychiatric:        Mood and Affect: Mood normal.        Behavior: Behavior normal.     RECENT LABS AND TESTS: BMET    Component Value Date/Time   NA 140 01/26/2019 0815   K 3.8 01/26/2019 0815   CL 103 01/26/2019 0815   CO2 24 01/26/2019 0815   GLUCOSE 94 01/26/2019 0815   BUN 7 01/26/2019 0815   CREATININE 0.97 01/26/2019 0815   CALCIUM 9.7 01/26/2019 0815   GFRNONAA 70 01/26/2019 0815   GFRAA 80 01/26/2019 0815   Lab Results  Component Value Date   HGBA1C 5.7 (H) 01/26/2019   HGBA1C 5.8 (H) 10/07/2018  Lab Results  Component Value Date   INSULIN 17.4 01/26/2019   INSULIN 16.2 10/07/2018   CBC    Component Value Date/Time   WBC 5.9 10/07/2018 0000   RBC 5.28 10/07/2018 0000   HGB 13.5 10/07/2018 0000   HCT 42.9 10/07/2018 0000   MCV 81 10/07/2018 0000   MCH 25.6 (L) 10/07/2018 0000   MCHC 31.5 10/07/2018 0000   RDW 13.6 10/07/2018 0000   LYMPHSABS 2.2 10/07/2018 0000   EOSABS 0.2 10/07/2018 0000   BASOSABS 0.0 10/07/2018 0000   Iron/TIBC/Ferritin/ %Sat No results found for: IRON, TIBC, FERRITIN, IRONPCTSAT Lipid Panel     Component Value Date/Time   CHOL 143 01/26/2019 0815   TRIG 80 01/26/2019 0815   HDL 44 01/26/2019 0815   LDLCALC 83 01/26/2019 0815   Hepatic Function Panel     Component Value Date/Time   PROT 7.5 01/26/2019 0815   ALBUMIN 4.0 01/26/2019 0815   AST 14 01/26/2019 0815   ALT 10 01/26/2019 0815   ALKPHOS 79 01/26/2019 0815   BILITOT 0.3 01/26/2019 0815      Component Value Date/Time   TSH 1.310 10/07/2018 0000     Ref. Range 01/26/2019 08:15  Vitamin D, 25-Hydroxy Latest Ref Range: 30.0 - 100.0 ng/mL 44.3    OBESITY BEHAVIORAL INTERVENTION VISIT  Today's visit was # 9   Starting weight: 249 lbs Starting date: 10/07/2018 Today's weight : 237 lbs Today's date:  03/11/2019 Total lbs lost to date: 12    03/11/2019  Height 5\' 6"  (1.676 m)  Weight 237 lb (107.5 kg)  BMI (Calculated) 38.27  BLOOD PRESSURE - SYSTOLIC 135  BLOOD PRESSURE - DIASTOLIC 83   Body Fat % 44.6 %  Total Body Water (lbs) 91.2 lbs    ASK: We discussed the diagnosis of obesity with Lona Millardorothy L Ismael today and Kirsten agreed to give us permission to discuss obesity behavioral modification therapy today.  ASSESS: Mackenzie CellaDorothy has the diagnosis of obesity and her BMI today is 38.27 Mackenzie CellaDorothy is in the action stage of change   ADVISE: Mackenzie CellaDorothy was educated on the multiple health risks of obesity as well as the benefit of weight loss to improve her health. She was advised of the need for long term treatment and the importance of lifestyle modifications to improve her current health and to decrease her risk of future health problems.  AGREE: Multiple dietary modification options and treatment options were discussed and  Coralie agreed to follow the recommendations documented in the above note.  ARRANGE: Mackenzie CellaDorothy was educated on the importance of frequent visits to treat obesity as outlined per CMS and USPSTF guidelines and agreed to schedule her next follow up appointment today.  I, [place] CraneJoanne Murray, am acting as transcriptionist for AshlandDawn Nohlan Burdin, FNP-C  I have reviewed the above documentation for accuracy and completeness, and I agree with the above.  - Neidy Guerrieri, FNP-C.

## 2019-03-16 ENCOUNTER — Encounter (INDEPENDENT_AMBULATORY_CARE_PROVIDER_SITE_OTHER): Payer: Self-pay | Admitting: Family Medicine

## 2019-03-16 DIAGNOSIS — R7303 Prediabetes: Secondary | ICD-10-CM | POA: Insufficient documentation

## 2019-03-16 DIAGNOSIS — E559 Vitamin D deficiency, unspecified: Secondary | ICD-10-CM | POA: Insufficient documentation

## 2019-04-05 ENCOUNTER — Ambulatory Visit (INDEPENDENT_AMBULATORY_CARE_PROVIDER_SITE_OTHER): Payer: BC Managed Care – PPO | Admitting: Physician Assistant

## 2019-04-05 ENCOUNTER — Other Ambulatory Visit: Payer: Self-pay

## 2019-04-05 ENCOUNTER — Encounter (INDEPENDENT_AMBULATORY_CARE_PROVIDER_SITE_OTHER): Payer: Self-pay | Admitting: Physician Assistant

## 2019-04-05 VITALS — BP 152/92 | HR 70 | Temp 97.8°F | Ht 66.0 in | Wt 239.0 lb

## 2019-04-05 DIAGNOSIS — E559 Vitamin D deficiency, unspecified: Secondary | ICD-10-CM

## 2019-04-05 DIAGNOSIS — E7849 Other hyperlipidemia: Secondary | ICD-10-CM | POA: Diagnosis not present

## 2019-04-05 DIAGNOSIS — Z6838 Body mass index (BMI) 38.0-38.9, adult: Secondary | ICD-10-CM

## 2019-04-05 DIAGNOSIS — Z9189 Other specified personal risk factors, not elsewhere classified: Secondary | ICD-10-CM | POA: Diagnosis not present

## 2019-04-05 MED ORDER — VITAMIN D (ERGOCALCIFEROL) 1.25 MG (50000 UNIT) PO CAPS: 50000.0000 [IU] | ORAL_CAPSULE | ORAL | 0 refills | Status: DC

## 2019-04-05 NOTE — Progress Notes (Signed)
Office: [phone]  /  Fax: [phone]   HPI:   Chief Complaint: OBESITY Mackenzie Watson is here to discuss her progress with her obesity treatment plan. She is on the Pescatarian eating plan and is following her eating plan approximately 70% of the time. She states she is exercising 0 minutes 0 times per week. [name] reports that she alternates between the Pescatarian and Category 2 plan. She has not been meal planning and often snacks instead of eating her full meals. 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  Her weight is 239 lb (108.4 kg) today and has had a weight gain of 2 lbs since her last visit. She has lost 10 lbs since starting treatment with [place].  Vitamin D deficiency Mackenzie Watson has a diagnosis of Vitamin D deficiency. She is currently taking prescription Vit D and denies nausea, vomiting or muscle weakness.  Hyperlipidemia Mackenzie Watson has hyperlipidemia and has been trying to improve her cholesterol levels with intensive lifestyle modification including a low saturated fat diet, exercise and weight loss. She is on no medication and denies any chest pain.  At risk for cardiovascular disease Mackenzie Watson is at a higher than average risk for cardiovascular disease due to obesity. She currently denies any chest pain.  ASSESSMENT AND PLAN:  Vitamin D deficiency - Plan: Vitamin D, Ergocalciferol, (DRISDOL) 1.25 MG (50000 UT) CAPS capsule  Other hyperlipidemia  At risk for heart disease  Class 2 severe obesity with serious comorbidity and body mass index (BMI) of 38.0 to 38.9 in adult, unspecified obesity type (HCC)  PLAN:  Vitamin D Deficiency Mackenzie Watson was informed that low Vitamin D levels contributes to fatigue and are associated with obesity, breast, and colon cancer. She agrees to continue to take prescription Vit D @ 50,000 IU every week and will follow-up for routine testing of Vitamin D, at least 2-3 times per year. She was informed of the risk of  over-replacement of Vitamin D and agrees to not increase her dose unless she discusses this with [place] first. Mackenzie Watson agrees to follow-up with our clinic in 2-3 weeks.  Hyperlipidemia Mackenzie Watson was informed of the American Heart Association Guidelines emphasizing intensive lifestyle modifications as the first line treatment for hyperlipidemia. We discussed many lifestyle modifications today in depth, and Mackenzie Watson will continue to work on decreasing saturated fats such as fatty red meat, butter and many fried foods. She will also increase vegetables and lean protein in her diet and continue to work on exercise and weight loss efforts.  Cardiovascular risk counseling Mackenzie Watson was given extended (15 minutes) coronary artery disease prevention counseling today. She is 48 y.o. female and has risk factors for heart disease including obesity. We discussed intensive lifestyle modifications today with an emphasis on specific weight loss instructions and strategies. Pt was also informed of the importance of increasing exercise and decreasing saturated fats to help prevent heart disease.  Obesity Mackenzie Watson is currently in the action stage of change. As such, her goal is to continue with weight loss efforts. She has agreed to follow the Pescatarian eating plan. Mackenzie Watson has been instructed to work up to a goal of 150 minutes of combined cardio and strengthening exercise per week for weight loss and overall health benefits. We discussed the following Behavioral Modification Strategies today: no skipping meals, work on meal planning and easy cooking plans.  Mackenzie Watson has agreed to follow-up with our clinic in 2-3 weeks. She was informed of the importance of frequent follow-up visits to  maximize her success with intensive lifestyle modifications for her multiple health conditions.  ALLERGIES: No Known Allergies  MEDICATIONS: Current Outpatient Medications on File Prior to Visit  Medication Sig Dispense Refill    chlorthalidone (HYGROTON) 25 MG tablet Take 1/2 (one-half) tablet by mouth once daily 30 tablet 0   COLLAGEN PO Take by mouth. 1 tblspn     meloxicam (MOBIC) 7.5 MG tablet Take 7.5 mg by mouth daily.     Multiple Vitamin (MULTIVITAMIN WITH MINERALS) TABS tablet Take 1 tablet by mouth daily.     norethindrone (MICRONOR) 0.35 MG tablet Take 1 tablet by mouth daily.     Vitamin D, Ergocalciferol, (DRISDOL) 1.25 MG (50000 UT) CAPS capsule Take 1 capsule (50,000 Units total) by mouth every 7 (seven) days. 4 capsule 0   No current facility-administered medications on file prior to visit.     PAST MEDICAL HISTORY: Past Medical History:  Diagnosis Date   Gallbladder problem    High blood pressure    Swollen ankles    Vitamin D deficiency     PAST SURGICAL HISTORY: Past Surgical History:  Procedure Laterality Date   CHOLECYSTECTOMY      SOCIAL HISTORY: Social History   Tobacco Use   Smoking status: Never Smoker   Smokeless tobacco: Never Used  Substance Use Topics   Alcohol use: Not on file   Drug use: Not on file    FAMILY HISTORY: Family History  Problem Relation Age of Onset   Heart disease Mother    Sudden death Mother    High blood pressure Father    Stroke Father    ROS: Review of Systems  Cardiovascular: Negative for chest pain.  Gastrointestinal: Negative for nausea and vomiting.  Musculoskeletal:       Negative for muscle weakness.   PHYSICAL EXAM: Blood pressure (!) 152/92, pulse 70, temperature 97.8 F (36.6 C), temperature source Oral, height 5\' 6"  (1.676 m), weight 239 lb (108.4 kg), SpO2 96 %. Body mass index is 38.58 kg/m. Physical Exam Vitals signs reviewed.  Constitutional:      Appearance: Normal appearance. She is obese.  Cardiovascular:     Rate and Rhythm: Normal rate.     Pulses: Normal pulses.  Pulmonary:     Effort: Pulmonary effort is normal.     Breath sounds: Normal breath sounds.  Musculoskeletal: Normal range of  motion.  Skin:    General: Skin is warm and dry.  Neurological:     Mental Status: She is alert and oriented to person, place, and time.  Psychiatric:        Behavior: Behavior normal.   RECENT LABS AND TESTS: BMET    Component Value Date/Time   NA 140 01/26/2019 0815   K 3.8 01/26/2019 0815   CL 103 01/26/2019 0815   CO2 24 01/26/2019 0815   GLUCOSE 94 01/26/2019 0815   BUN 7 01/26/2019 0815   CREATININE 0.97 01/26/2019 0815   CALCIUM 9.7 01/26/2019 0815   GFRNONAA 70 01/26/2019 0815   GFRAA 80 01/26/2019 0815   Lab Results  Component Value Date   HGBA1C 5.7 (H) 01/26/2019   HGBA1C 5.8 (H) 10/07/2018   Lab Results  Component Value Date   INSULIN 17.4 01/26/2019   INSULIN 16.2 10/07/2018   CBC    Component Value Date/Time   WBC 5.9 10/07/2018 0000   RBC 5.28 10/07/2018 0000   HGB 13.5 10/07/2018 0000   HCT 42.9 10/07/2018 0000   MCV 81 10/07/2018 0000  MCH 25.6 (L) 10/07/2018 0000   MCHC 31.5 10/07/2018 0000   RDW 13.6 10/07/2018 0000   LYMPHSABS 2.2 10/07/2018 0000   EOSABS 0.2 10/07/2018 0000   BASOSABS 0.0 10/07/2018 0000   Iron/TIBC/Ferritin/ %Sat No results found for: IRON, TIBC, FERRITIN, IRONPCTSAT Lipid Panel     Component Value Date/Time   CHOL 143 01/26/2019 0815   TRIG 80 01/26/2019 0815   HDL 44 01/26/2019 0815   LDLCALC 83 01/26/2019 0815   Hepatic Function Panel     Component Value Date/Time   PROT 7.5 01/26/2019 0815   ALBUMIN 4.0 01/26/2019 0815   AST 14 01/26/2019 0815   ALT 10 01/26/2019 0815   ALKPHOS 79 01/26/2019 0815   BILITOT 0.3 01/26/2019 0815      Component Value Date/Time   TSH 1.310 10/07/2018 0000   Results for ADDY, MCMANNIS (MRN [id number]) as of 04/05/2019 09:29  Ref. Range 01/26/2019 08:15  Vitamin D, 25-Hydroxy Latest Ref Range: 30.0 - 100.0 ng/mL 44.3   OBESITY BEHAVIORAL INTERVENTION VISIT  Today's visit was #10  Starting weight: 249 lbs Starting date: 10/07/2018 Today's weight: 239 lbs  Today's  date: 04/05/2019 Total lbs lost to date: 10     04/05/2019  Height 5\' 6"  (1.676 m)  Weight 239 lb (108.4 kg)  BMI (Calculated) 38.59  BLOOD PRESSURE - SYSTOLIC 888  BLOOD PRESSURE - DIASTOLIC 92   Body Fat % 91.6 %  Total Body Water (lbs) 92 lbs   ASK: We discussed the diagnosis of obesity with Mackenzie Watson today and Mackenzie Watson agreed to give [place] permission to discuss obesity behavioral modification therapy today.  ASSESS: Mackenzie Watson has the diagnosis of obesity and her BMI today is 38.6. Kyomi is in the action stage of change.   ADVISE: Mackenzie Watson was educated on the multiple health risks of obesity as well as the benefit of weight loss to improve her health. She was advised of the need for long term treatment and the importance of lifestyle modifications to improve her current health and to decrease her risk of future health problems.  AGREE: Multiple dietary modification options and treatment options were discussed and  Mackenzie Watson agreed to follow the recommendations documented in the above note.  ARRANGE: Mackenzie Watson was educated on the importance of frequent visits to treat obesity as outlined per CMS and USPSTF guidelines and agreed to schedule her next follow up appointment today.  Migdalia Dk, am acting as transcriptionist for Abby Potash, PA-C I, Abby Potash, PA-C have reviewed above note and agree with its content

## 2019-04-06 ENCOUNTER — Encounter (INDEPENDENT_AMBULATORY_CARE_PROVIDER_SITE_OTHER): Payer: Self-pay

## 2019-04-08 ENCOUNTER — Encounter (INDEPENDENT_AMBULATORY_CARE_PROVIDER_SITE_OTHER): Payer: Self-pay

## 2019-04-28 ENCOUNTER — Telehealth (INDEPENDENT_AMBULATORY_CARE_PROVIDER_SITE_OTHER): Payer: BC Managed Care – PPO | Admitting: Physician Assistant

## 2019-04-28 ENCOUNTER — Encounter (INDEPENDENT_AMBULATORY_CARE_PROVIDER_SITE_OTHER): Payer: Self-pay | Admitting: Physician Assistant

## 2019-04-28 ENCOUNTER — Other Ambulatory Visit: Payer: Self-pay

## 2019-04-28 DIAGNOSIS — Z6838 Body mass index (BMI) 38.0-38.9, adult: Secondary | ICD-10-CM

## 2019-04-28 DIAGNOSIS — E559 Vitamin D deficiency, unspecified: Secondary | ICD-10-CM

## 2019-04-28 DIAGNOSIS — E7849 Other hyperlipidemia: Secondary | ICD-10-CM | POA: Diagnosis not present

## 2019-04-28 MED ORDER — VITAMIN D (ERGOCALCIFEROL) 1.25 MG (50000 UNIT) PO CAPS: 50000.0000 [IU] | ORAL_CAPSULE | ORAL | 0 refills | Status: DC

## 2019-04-28 NOTE — Progress Notes (Signed)
Office: [phone]  /  Fax: [phone] TeleHealth Visit:  Mackenzie Watson has verbally consented to this TeleHealth visit today. The patient is located at home, the provider is located at the [employer] [facility] office. The participants in this visit include the listed provider and patient. The visit was conducted today via Facetime.  HPI:  Chief Complaint: OBESITY Mackenzie Watson is here to discuss her progress with her obesity treatment plan. She is on the Pescatarian plan and states she is following her eating plan approximately 75 % of the time. She states she is exercising 0 minutes 0 times per week.  Mackenzie Watson reports most recent weight as 238 lbs. She states that she has done better job making egg muffins for breakfast and eating sandwiches for lunch. She wants to start exercising.   Today's visit was # 11  Starting weight: 249 lbs Starting date: 10/07/18 Today's date: 04/28/2019 Total lbs lost to date: 10 lbs  Vitamin D deficiency Mackenzie Watson has a diagnosis of vitamin D deficiency. She is currently taking prescription vit D. She denies nausea, vomiting, and muscle weakness.   Hyperlipidemia Mackenzie Watson has hyperlipidemia. She is not taking medications or currently exercising. She denies chest pain.    ASSESSMENT AND PLAN:  Vitamin D deficiency - Plan: Vitamin D, Ergocalciferol, (DRISDOL) 1.25 MG (50000 UT) CAPS capsule  Other hyperlipidemia  Class 2 severe obesity with serious comorbidity and body mass index (BMI) of 38.0 to 38.9 in adult, unspecified obesity type (HCC)  PLAN: Vitamin D Deficiency Low Vitamin D level contributes to fatigue and are associated with obesity, breast, and colon cancer. She agrees to continue to take prescription Vitamin D @50 ,000 IU every week #4 with no refills sent in today and will follow up for routine testing of vitamin D, at least 2-3 times per year to avoid over-replacement.  Hyperlipidemia Intensive lifestyle modifications as the first  line treatment for hyperlipidemia. We discussed many lifestyle modifications today and Mackenzie Watson will continue to work on diet, exercise and weight loss efforts.  I spent > than 50% of the 27 minute visit on counseling as documented in the note.  Obesity Mackenzie Watson is currently in the action stage of change. As such, her goal is to continue with weight loss efforts. She has agreed to follow the Pescatarian plan.  Mackenzie Watson has been instructed to work up to a goal of 150 minutes of combined cardio and strengthening exercise per week for weight loss and overall health benefits. We discussed the following Behavioral Modification Strategies today: meal planning and cooking strategies and planning for success.    Mackenzie Watson has agreed to follow-up with our clinic in 3 weeks. She was informed of the importance of frequent follow-up visits to maximize her success with intensive lifestyle modifications for her multiple health conditions.  ALLERGIES: No Known Allergies  MEDICATIONS: Current Outpatient Medications on File Prior to Visit  Medication Sig Dispense Refill  . chlorthalidone (HYGROTON) 25 MG tablet Take 1/2 (one-half) tablet by mouth once daily 30 tablet 0  . COLLAGEN PO Take by mouth. 1 tblspn    . meloxicam (MOBIC) 7.5 MG tablet Take 7.5 mg by mouth daily.    . Multiple Vitamin (MULTIVITAMIN WITH MINERALS) TABS tablet Take 1 tablet by mouth daily.    . norethindrone (MICRONOR) 0.35 MG tablet Take 1 tablet by mouth daily.     No current facility-administered medications on file prior to visit.    PAST MEDICAL HISTORY: Past Medical History:  Diagnosis Date  . Gallbladder  problem   . High blood pressure   . Swollen ankles   . Vitamin D deficiency     PAST SURGICAL HISTORY: Past Surgical History:  Procedure Laterality Date  . CHOLECYSTECTOMY      SOCIAL HISTORY: Social History   Tobacco Use  . Smoking status: Never Smoker  . Smokeless tobacco: Never Used  Substance Use Topics    . Alcohol use: Not on file  . Drug use: Not on file    FAMILY HISTORY: Family History  Problem Relation Age of Onset  . Heart disease Mother   . Sudden death Mother   . High blood pressure Father   . Stroke Father     ROS: Review of Systems  Cardiovascular: Negative for chest pain.  Gastrointestinal: Negative for nausea and vomiting.  Musculoskeletal:       Negative for muscle weakness    PHYSICAL EXAM: There were no vitals taken for this visit. There is no height or weight on file to calculate BMI. Physical Exam  RECENT LABS AND TESTS: BMET    Component Value Date/Time   NA 140 01/26/2019 0815   K 3.8 01/26/2019 0815   CL 103 01/26/2019 0815   CO2 24 01/26/2019 0815   GLUCOSE 94 01/26/2019 0815   BUN 7 01/26/2019 0815   CREATININE 0.97 01/26/2019 0815   CALCIUM 9.7 01/26/2019 0815   GFRNONAA 70 01/26/2019 0815   GFRAA 80 01/26/2019 0815   Lab Results  Component Value Date   HGBA1C 5.7 (H) 01/26/2019   HGBA1C 5.8 (H) 10/07/2018   Lab Results  Component Value Date   INSULIN 17.4 01/26/2019   INSULIN 16.2 10/07/2018   CBC    Component Value Date/Time   WBC 5.9 10/07/2018 0000   RBC 5.28 10/07/2018 0000   HGB 13.5 10/07/2018 0000   HCT 42.9 10/07/2018 0000   MCV 81 10/07/2018 0000   MCH 25.6 (L) 10/07/2018 0000   MCHC 31.5 10/07/2018 0000   RDW 13.6 10/07/2018 0000   LYMPHSABS 2.2 10/07/2018 0000   EOSABS 0.2 10/07/2018 0000   BASOSABS 0.0 10/07/2018 0000   Iron/TIBC/Ferritin/ %Sat No results found for: IRON, TIBC, FERRITIN, IRONPCTSAT Lipid Panel     Component Value Date/Time   CHOL 143 01/26/2019 0815   TRIG 80 01/26/2019 0815   HDL 44 01/26/2019 0815   LDLCALC 83 01/26/2019 0815   Hepatic Function Panel     Component Value Date/Time   PROT 7.5 01/26/2019 0815   ALBUMIN 4.0 01/26/2019 0815   AST 14 01/26/2019 0815   ALT 10 01/26/2019 0815   ALKPHOS 79 01/26/2019 0815   BILITOT 0.3 01/26/2019 0815      Component Value Date/Time    TSH 1.310 10/07/2018 0000     I, Renee Ramus, am acting as Location manager for Abby Potash, PA-C I, Abby Potash, PA-C have reviewed above note and agree with its content

## 2019-05-19 ENCOUNTER — Ambulatory Visit (INDEPENDENT_AMBULATORY_CARE_PROVIDER_SITE_OTHER): Payer: BC Managed Care – PPO | Admitting: Physician Assistant

## 2019-05-24 ENCOUNTER — Other Ambulatory Visit (INDEPENDENT_AMBULATORY_CARE_PROVIDER_SITE_OTHER): Payer: Self-pay

## 2019-05-24 ENCOUNTER — Encounter (INDEPENDENT_AMBULATORY_CARE_PROVIDER_SITE_OTHER): Payer: Self-pay | Admitting: Physician Assistant

## 2019-05-24 DIAGNOSIS — I10 Essential (primary) hypertension: Secondary | ICD-10-CM

## 2019-05-24 MED ORDER — CHLORTHALIDONE 25 MG PO TABS: 12.5000 mg | ORAL_TABLET | Freq: Every day | ORAL | 0 refills | Status: DC

## 2019-05-26 ENCOUNTER — Other Ambulatory Visit: Payer: Self-pay

## 2019-05-26 ENCOUNTER — Telehealth (INDEPENDENT_AMBULATORY_CARE_PROVIDER_SITE_OTHER): Payer: BC Managed Care – PPO | Admitting: Physician Assistant

## 2019-05-26 ENCOUNTER — Encounter (INDEPENDENT_AMBULATORY_CARE_PROVIDER_SITE_OTHER): Payer: Self-pay | Admitting: Physician Assistant

## 2019-05-26 DIAGNOSIS — Z6838 Body mass index (BMI) 38.0-38.9, adult: Secondary | ICD-10-CM

## 2019-05-26 DIAGNOSIS — E559 Vitamin D deficiency, unspecified: Secondary | ICD-10-CM

## 2019-05-26 NOTE — Progress Notes (Signed)
TeleHealth Visit:  Due to the COVID-19 pandemic, this visit was completed with telemedicine (audio/video) technology to reduce patient and provider exposure as well as to preserve personal protective equipment.   Mackenzie Watson has verbally consented to this TeleHealth visit. The patient is located at home, the provider is located at the [employer] [facility] office. The participants in this visit include the listed provider and patient. The visit was conducted today via Face Time.  Chief Complaint: OBESITY Mackenzie Watson is here to discuss her progress with her obesity treatment plan along with follow-up of her obesity related diagnoses. Mackenzie Watson is on the [place] and states she is following her eating plan approximately 75-80% of the time. Mackenzie Watson states she is walking for 30-45 minutes 1 times per week.  Today's visit was #: 12 Starting weight: 249 lbs Starting date: 10/07/2018  Interim History: Mackenzie Watson's most recent weight is 235 pounds.  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  She reports that her hunger is controlled.  She is working on meal prep and planning.  Subjective:   1. Vitamin D deficiency Mackenzie Watson's Vitamin D level was 44.3 on 01/26/2019. She is currently taking vit D. She denies nausea, vomiting or muscle weakness.  Assessment/Plan:   1. Vitamin D deficiency Low Vitamin D level contributes to fatigue and are associated with obesity, breast, and colon cancer. She agrees to taking Vitamin D @4 ,000 IU daily and will follow-up for routine testing of Vitamin D, at least 2-3 times per year to avoid over-replacement.  2. Class 2 severe obesity with serious comorbidity and body mass index (BMI) of 38.0 to 38.9 in adult, unspecified obesity type [facility]) Mackenzie Watson is currently in the action stage of change. As such, her goal is to continue with weight loss efforts. She has agreed to the [employer].   Exercise goals: For substantial health benefits, adults  should do at least 150 minutes (2 hours and 30 minutes) a week of moderate-intensity, or 75 minutes (1 hour and 15 minutes) a week of vigorous-intensity aerobic physical activity, or an equivalent combination of moderate- and vigorous-intensity aerobic activity. Aerobic activity should be performed in episodes of at least 10 minutes, and preferably, it should be spread throughout the week. Adults should also include muscle-strengthening activities that involve all major muscle groups on 2 or more days a week.  Behavioral modification strategies: meal planning and cooking strategies and planning for success.  Mackenzie Watson has agreed to follow-up with our clinic in 2 weeks. She was informed of the importance of frequent follow-up visits to maximize her success with intensive lifestyle modifications for her multiple health conditions.  Objective:   VITALS: Per patient if applicable, see vitals. GENERAL: Alert and in no acute distress. CARDIOPULMONARY: No increased WOB. Speaking in clear sentences.  PSYCH: Pleasant and cooperative. Speech normal rate and rhythm. Affect is appropriate. Insight and judgement are appropriate. Attention is focused, linear, and appropriate.  NEURO: Oriented as arrived to appointment on time with no prompting.   Lab Results  Component Value Date   CREATININE 0.97 01/26/2019   BUN 7 01/26/2019   NA 140 01/26/2019   K 3.8 01/26/2019   CL 103 01/26/2019   CO2 24 01/26/2019   Lab Results  Component Value Date   ALT 10 01/26/2019   AST 14 01/26/2019   ALKPHOS 79 01/26/2019   BILITOT 0.3 01/26/2019   Lab Results  Component Value Date   HGBA1C 5.7 (H) 01/26/2019   HGBA1C 5.8 (H) 10/07/2018  Lab Results  Component Value Date   INSULIN 17.4 01/26/2019   INSULIN 16.2 10/07/2018   Lab Results  Component Value Date   TSH 1.310 10/07/2018   Lab Results  Component Value Date   CHOL 143 01/26/2019   HDL 44 01/26/2019   LDLCALC 83 01/26/2019   TRIG 80 01/26/2019    Lab Results  Component Value Date   WBC 5.9 10/07/2018   HGB 13.5 10/07/2018   HCT 42.9 10/07/2018   MCV 81 10/07/2018   Attestation Statements:   Reviewed by clinician on day of visit: allergies, medications, problem list, medical history, surgical history, family history, social history, and previous encounter notes.  Time spent on visit including pre-visit chart review and post-visit care was 30 minutes.   I, Insurance claims handler, CMA, am acting as Energy manager for [employer], PA-C.  I have reviewed the above documentation for accuracy and completeness, and I agree with the above. Alois Cliche, PA-C

## 2019-06-08 ENCOUNTER — Ambulatory Visit: Payer: BC Managed Care – PPO | Attending: Internal Medicine

## 2019-06-08 DIAGNOSIS — Z20822 Contact with and (suspected) exposure to covid-19: Secondary | ICD-10-CM

## 2019-06-09 LAB — NOVEL CORONAVIRUS, NAA: SARS-CoV-2, NAA: NOT DETECTED

## 2019-06-15 ENCOUNTER — Ambulatory Visit (INDEPENDENT_AMBULATORY_CARE_PROVIDER_SITE_OTHER): Payer: BC Managed Care – PPO | Admitting: Physician Assistant

## 2019-07-10 ENCOUNTER — Ambulatory Visit: Payer: BC Managed Care – PPO | Attending: Internal Medicine

## 2019-07-10 DIAGNOSIS — Z23 Encounter for immunization: Secondary | ICD-10-CM | POA: Insufficient documentation

## 2019-07-10 NOTE — Progress Notes (Signed)
   Covid-19 Vaccination Clinic  Name:  Mackenzie Watson    MRN: [id number] DOB: 23-Feb-1971  07/10/2019  Mackenzie Watson was observed post Covid-19 immunization for 15 minutes without incident. She was provided with Vaccine Information Sheet and instruction to access the V-Safe system.   Mackenzie Watson was instructed to call 911 with any severe reactions post vaccine: Marland Kitchen Difficulty breathing  . Swelling of face and throat  . A fast heartbeat  . A bad rash all over body  . Dizziness and weakness   Immunizations Administered    Name Date Dose VIS Date Route   Pfizer COVID-19 Vaccine 07/10/2019  6:44 PM 0.3 mL 04/16/2019 Intramuscular   Manufacturer: [employer]   Lot: [id number]   NDC: [address]-8

## 2019-07-13 ENCOUNTER — Encounter (INDEPENDENT_AMBULATORY_CARE_PROVIDER_SITE_OTHER): Payer: Self-pay | Admitting: Family Medicine

## 2019-07-13 ENCOUNTER — Other Ambulatory Visit: Payer: Self-pay

## 2019-07-13 ENCOUNTER — Ambulatory Visit (INDEPENDENT_AMBULATORY_CARE_PROVIDER_SITE_OTHER): Payer: BC Managed Care – PPO | Admitting: Family Medicine

## 2019-07-13 VITALS — BP 146/87 | HR 79 | Temp 98.4°F | Ht 66.0 in | Wt 235.0 lb

## 2019-07-13 DIAGNOSIS — Z9189 Other specified personal risk factors, not elsewhere classified: Secondary | ICD-10-CM | POA: Diagnosis not present

## 2019-07-13 DIAGNOSIS — R7303 Prediabetes: Secondary | ICD-10-CM

## 2019-07-13 DIAGNOSIS — I10 Essential (primary) hypertension: Secondary | ICD-10-CM | POA: Insufficient documentation

## 2019-07-13 DIAGNOSIS — Z6838 Body mass index (BMI) 38.0-38.9, adult: Secondary | ICD-10-CM

## 2019-07-13 DIAGNOSIS — E559 Vitamin D deficiency, unspecified: Secondary | ICD-10-CM | POA: Diagnosis not present

## 2019-07-13 MED ORDER — CHLORTHALIDONE 25 MG PO TABS: 12.5000 mg | ORAL_TABLET | Freq: Every day | ORAL | 0 refills | Status: DC

## 2019-07-13 MED ORDER — VITAMIN D (ERGOCALCIFEROL) 1.25 MG (50000 UNIT) PO CAPS: 50000.0000 [IU] | ORAL_CAPSULE | ORAL | 0 refills | Status: DC

## 2019-07-13 NOTE — Progress Notes (Signed)
Chief Complaint:   OBESITY Mackenzie Watson is here to discuss her progress with her obesity treatment plan along with follow-up of her obesity related diagnoses. Mackenzie Watson is on the [employer] and states she is following her eating plan approximately 60% of the time. Mackenzie Watson states she is exercising 0 minutes 0 times per week.  Today's visit was #: 13 Starting weight: 249 lbs Starting date: 10/07/2018 Today's weight: 235 lbs Today's date: 07/13/2019 Total lbs lost to date: 14 Total lbs lost since last in-office visit: 4  Interim History: Mackenzie Watson reports hunger is satisfied. She is making sure that she eats, even when she is not hungry. She has indulged on sweets a few times and she has gone over on calories, but has overall done well. She is meal prepping on the weekend which helps her to adhere to the plan.  Subjective:   Essential hypertension  Mackenzie Watson's blood pressure is not well controlled. She is on 12.5 mg of Chlorthalidone. She denies chest pain or shortness of breath. We may need to increase the dose to 25 mg per day.  BP Readings from Last 3 Encounters:  07/13/19 (!) 146/87  04/05/19 (!) 152/92  03/11/19 135/83   Lab Results  Component Value Date   CREATININE 0.97 01/26/2019   CREATININE 0.73 10/07/2018   Vitamin D deficiency  Lyanna's Vitamin D level was 44.3 on 01/26/19. She is on prescription vit D. She denies nausea, vomiting or muscle weakness.  Prediabetes  Mackenzie Watson has a diagnosis of prediabetes and her last A1c was 5.7 (01/26/19). She is not on metformin. She continues to work on diet and exercise to decrease her risk of diabetes. She denies polyphagia.  Lab Results  Component Value Date   HGBA1C 5.7 (H) 01/26/2019   Lab Results  Component Value Date   INSULIN 17.4 01/26/2019   INSULIN 16.2 10/07/2018   At risk for heart disease Mackenzie Watson is at a higher than average risk for cardiovascular disease due to obesity and hypertension. Reviewed: no chest  pain on exertion, no dyspnea on exertion, and no swelling of ankles.  Assessment/Plan:   Essential hypertension  Mackenzie Watson is working on healthy weight loss and exercise to improve blood pressure control. Mackenzie Watson agrees to take Chlorthalidone 25 mg daily, patient to take 1/2 pill daily #45 with no refills (patient requested 90 days). We will continue to monitor and possibly increase dose.   Vitamin D deficiency  Low Vitamin D level contributes to fatigue and are associated with obesity, breast, and colon cancer. Mackenzie Watson agrees to continue to take prescription Vitamin D @50 ,000 IU every week #4 with no refills and we will check vitamin D level today. She will follow-up for routine testing of Vitamin D, at least 2-3 times per year to avoid over-replacement.  Prediabetes Mackenzie Watson will continue to work on weight loss, exercise, and decreasing simple carbohydrates to help decrease the risk of diabetes. We will check A1c, fasting insulin and glucose today.  At risk for heart disease Mackenzie Watson was given approximately 15 minutes of coronary artery disease prevention counseling today. She is 49 y.o. female and has risk factors for heart disease including obesity. We discussed intensive lifestyle modifications today with an emphasis on specific weight loss instructions and strategies.   Repetitive spaced learning was employed today to elicit superior memory formation and behavioral change.  Class 2 severe obesity with serious comorbidity and body mass index (BMI) of 38.0 to 38.9 in adult, unspecified obesity type Mackenzie Watson) Mackenzie Watson is  currently in the action stage of change. As such, her goal is to continue with weight loss efforts. She has agreed to the [employer] or the [place].   Exercise goals: All adults should avoid inactivity. Some physical activity is better than none, and adults who participate in any amount of physical activity gain some health benefits. Mackenzie Watson will try to add in some  walking.  Behavioral modification strategies: decreasing simple carbohydrates and planning for success.  Mackenzie Watson has agreed to follow-up with our clinic in 2 to 3 weeks. She was informed of the importance of frequent follow-up visits to maximize her success with intensive lifestyle modifications for her multiple health conditions.   Mackenzie Watson was informed we would discuss her lab results at her next visit unless there is a critical issue that needs to be addressed sooner. Mackenzie Watson agreed to keep her next visit at the agreed upon time to discuss these results.  Objective:   Blood pressure (!) 146/87, pulse 79, temperature 98.4 F (36.9 C), temperature source Oral, height 5\' 6"  (1.676 m), weight 235 lb (106.6 kg), last menstrual period 06/14/2018, SpO2 97 %. Body mass index is 37.93 kg/m.  General: Cooperative, alert, well developed, in no acute distress. HEENT: Conjunctivae and lids unremarkable. Cardiovascular: Regular rhythm.  Lungs: Normal work of breathing. Neurologic: No focal deficits.   Lab Results  Component Value Date   CREATININE 0.97 01/26/2019   BUN 7 01/26/2019   NA 140 01/26/2019   K 3.8 01/26/2019   CL 103 01/26/2019   CO2 24 01/26/2019   Lab Results  Component Value Date   ALT 10 01/26/2019   AST 14 01/26/2019   ALKPHOS 79 01/26/2019   BILITOT 0.3 01/26/2019   Lab Results  Component Value Date   HGBA1C 5.7 (H) 01/26/2019   HGBA1C 5.8 (H) 10/07/2018   Lab Results  Component Value Date   INSULIN 17.4 01/26/2019   INSULIN 16.2 10/07/2018   Lab Results  Component Value Date   TSH 1.310 10/07/2018   Lab Results  Component Value Date   CHOL 143 01/26/2019   HDL 44 01/26/2019   LDLCALC 83 01/26/2019   TRIG 80 01/26/2019   Lab Results  Component Value Date   WBC 5.9 10/07/2018   HGB 13.5 10/07/2018   HCT 42.9 10/07/2018   MCV 81 10/07/2018   No results found for: IRON, TIBC, FERRITIN   Ref. Range 01/26/2019 08:15  Vitamin D, 25-Hydroxy Latest Ref  Range: 30.0 - 100.0 ng/mL 44.3    Attestation Statements:   Reviewed by clinician on day of visit: allergies, medications, problem list, medical history, surgical history, family history, social history, and previous encounter notes.  Corey Skains, am acting as Location manager for [employer], FNP-C.  I have reviewed the above documentation for accuracy and completeness, and I agree with the above. -  Shearon Clonch [employer], FNP-C

## 2019-07-14 LAB — COMPREHENSIVE METABOLIC PANEL
ALT: 15 IU/L (ref 0–32)
AST: 17 IU/L (ref 0–40)
Albumin/Globulin Ratio: 1.3 (ref 1.2–2.2)
Albumin: 4.4 g/dL (ref 3.8–4.8)
Alkaline Phosphatase: 70 IU/L (ref 39–117)
BUN/Creatinine Ratio: 10 (ref 9–23)
BUN: 9 mg/dL (ref 6–24)
Bilirubin Total: 0.5 mg/dL (ref 0.0–1.2)
CO2: 23 mmol/L (ref 20–29)
Calcium: 9.8 mg/dL (ref 8.7–10.2)
Chloride: 104 mmol/L (ref 96–106)
Creatinine, Ser: 0.91 mg/dL (ref 0.57–1.00)
GFR calc Af Amer: 86 mL/min/{1.73_m2} (ref >59)
GFR calc non Af Amer: 75 mL/min/{1.73_m2} (ref >59)
Globulin, Total: 3.3 g/dL (ref 1.5–4.5)
Glucose: 86 mg/dL (ref 65–99)
Potassium: 4.3 mmol/L (ref 3.5–5.2)
Sodium: 144 mmol/L (ref 134–144)
Total Protein: 7.7 g/dL (ref 6.0–8.5)

## 2019-07-14 LAB — HEMOGLOBIN A1C
Est. average glucose Bld gHb Est-mCnc: 117 mg/dL
Hgb A1c MFr Bld: 5.7 % — ABNORMAL HIGH (ref 4.8–5.6)

## 2019-07-14 LAB — INSULIN, RANDOM: INSULIN: 11.2 u[IU]/mL (ref 2.6–24.9)

## 2019-07-14 LAB — VITAMIN D 25 HYDROXY (VIT D DEFICIENCY, FRACTURES): Vit D, 25-Hydroxy: 51.8 ng/mL (ref 30.0–100.0)

## 2019-07-31 ENCOUNTER — Ambulatory Visit: Payer: BC Managed Care – PPO | Attending: Internal Medicine

## 2019-07-31 DIAGNOSIS — Z23 Encounter for immunization: Secondary | ICD-10-CM

## 2019-07-31 NOTE — Progress Notes (Signed)
   Covid-19 Vaccination Clinic  Name:  SVETLANA BAGBY    MRN: [id number] DOB: January 23, 1971  07/31/2019  Ms. Tirpak was observed post Covid-19 immunization for 15 minutes without incident. She was provided with Vaccine Information Sheet and instruction to access the V-Safe system.   Ms. Novitsky was instructed to call 911 with any severe reactions post vaccine: Marland Kitchen Difficulty breathing  . Swelling of face and throat  . A fast heartbeat  . A bad rash all over body  . Dizziness and weakness   Immunizations Administered    Name Date Dose VIS Date Route   Pfizer COVID-19 Vaccine 07/31/2019  4:26 PM 0.3 mL 04/16/2019 Intramuscular   Manufacturer: [employer], [employer]   Lot: [id number]   NDC: [address]-6

## 2019-08-03 ENCOUNTER — Ambulatory Visit (INDEPENDENT_AMBULATORY_CARE_PROVIDER_SITE_OTHER): Payer: BC Managed Care – PPO | Admitting: Family Medicine

## 2019-08-03 ENCOUNTER — Encounter (INDEPENDENT_AMBULATORY_CARE_PROVIDER_SITE_OTHER): Payer: Self-pay | Admitting: Family Medicine

## 2019-08-03 ENCOUNTER — Other Ambulatory Visit: Payer: Self-pay

## 2019-08-03 VITALS — BP 122/84 | HR 111 | Temp 99.6°F | Ht 66.0 in | Wt 232.0 lb

## 2019-08-03 DIAGNOSIS — E559 Vitamin D deficiency, unspecified: Secondary | ICD-10-CM

## 2019-08-03 DIAGNOSIS — R7303 Prediabetes: Secondary | ICD-10-CM | POA: Diagnosis not present

## 2019-08-03 DIAGNOSIS — Z6837 Body mass index (BMI) 37.0-37.9, adult: Secondary | ICD-10-CM

## 2019-08-03 DIAGNOSIS — Z9189 Other specified personal risk factors, not elsewhere classified: Secondary | ICD-10-CM

## 2019-08-03 NOTE — Progress Notes (Signed)
Chief Complaint:   OBESITY Mackenzie Watson is here to discuss her progress with her obesity treatment plan along with follow-up of her obesity related diagnoses. Mackenzie Watson is on the [employer] and states she is following her eating plan approximately 80-85% of the time. Mackenzie Watson states she is walking for 30 minutes 2 times per week.  Today's visit was #: 14 Starting weight: 249 lbs Starting date: 10/07/2018 Today's weight: 232 lbs Today's date: 08/03/2019 Total lbs lost to date: 17 lbs Total lbs lost since last in-office visit: 3 lbs  Interim History: Mackenzie Watson had a colonoscopy yesterday due to underlying health issues.  She has been sticking to the pescatarian plan fairly strictly the past few weeks.  No hunger.  She endorses cravings for sweets.  She is fully vaccinated against COVID as of 4 days ago.  Subjective:   1. Vitamin D deficiency Mackenzie Watson's Vitamin D level was 51.8 on 07/13/2019. She is currently taking prescription vitamin D 50,000 IU each week. She denies nausea, vomiting or muscle weakness.  She endorses fatigue.  2. Prediabetes Mackenzie Watson has a diagnosis of prediabetes based on her elevated HgA1c and was informed this puts her at greater risk of developing diabetes. She continues to work on diet and exercise to decrease her risk of diabetes. She denies nausea or hypoglycemia.  She is not on metformin.  Lab Results  Component Value Date   HGBA1C 5.7 (H) 07/13/2019   Lab Results  Component Value Date   INSULIN 11.2 07/13/2019   INSULIN 17.4 01/26/2019   INSULIN 16.2 10/07/2018   3. At risk for osteoporosis Mackenzie Watson is at higher risk of osteopenia and osteoporosis due to Vitamin D deficiency.   Assessment/Plan:   1. Vitamin D deficiency Low Vitamin D level contributes to fatigue and are associated with obesity, breast, and colon cancer. She agrees to continue to take prescription Vitamin D @50 ,000 IU every week and will follow-up for routine testing of Vitamin D, at least  2-3 times per year to avoid over-replacement. - Vitamin D, Ergocalciferol, (DRISDOL) 1.25 MG (50000 UNIT) CAPS capsule; Take 1 capsule (50,000 Units total) by mouth every 7 (seven) days.  Dispense: 4 capsule; Refill: 0  2. Prediabetes Amaurie will continue to work on weight loss, exercise, and decreasing simple carbohydrates to help decrease the risk of diabetes.   3. At risk for osteoporosis Mackenzie Watson was given approximately 15 minutes of osteoporosis prevention counseling today. Mackenzie Watson is at risk for osteopenia and osteoporosis due to her Vitamin D deficiency. She was encouraged to take her Vitamin D and follow her higher calcium diet and increase strengthening exercise to help strengthen her bones and decrease her risk of osteopenia and osteoporosis.  Repetitive spaced learning was employed today to elicit superior memory formation and behavioral change.  4. Class 2 severe obesity with serious comorbidity and body mass index (BMI) of 37.0 to 37.9 in adult, unspecified obesity type [facility]) Mackenzie Watson is currently in the action stage of change. As such, her goal is to continue with weight loss efforts. She has agreed to the [employer].   Exercise goals: As is.  Behavioral modification strategies: increasing lean protein intake, meal planning and cooking strategies, keeping healthy foods in the home and planning for success.  Mackenzie Watson has agreed to follow-up with our clinic in 3 weeks. She was informed of the importance of frequent follow-up visits to maximize her success with intensive lifestyle modifications for her multiple health conditions.   Objective:   Blood pressure 122/84,  pulse (!) 111, temperature 99.6 F (37.6 C), temperature source Oral, height 5\' 6"  (1.676 m), weight 232 lb (105.2 kg), last menstrual period 06/25/2018, SpO2 95 %. Body mass index is 37.45 kg/m.  General: Cooperative, alert, well developed, in no acute distress. HEENT: Conjunctivae and lids  unremarkable. Cardiovascular: Regular rhythm.  Lungs: Normal work of breathing. Neurologic: No focal deficits.   Lab Results  Component Value Date   CREATININE 0.91 07/13/2019   BUN 9 07/13/2019   NA 144 07/13/2019   K 4.3 07/13/2019   CL 104 07/13/2019   CO2 23 07/13/2019   Lab Results  Component Value Date   ALT 15 07/13/2019   AST 17 07/13/2019   ALKPHOS 70 07/13/2019   BILITOT 0.5 07/13/2019   Lab Results  Component Value Date   HGBA1C 5.7 (H) 07/13/2019   HGBA1C 5.7 (H) 01/26/2019   HGBA1C 5.8 (H) 10/07/2018   Lab Results  Component Value Date   INSULIN 11.2 07/13/2019   INSULIN 17.4 01/26/2019   INSULIN 16.2 10/07/2018   Lab Results  Component Value Date   TSH 1.310 10/07/2018   Lab Results  Component Value Date   CHOL 143 01/26/2019   HDL 44 01/26/2019   LDLCALC 83 01/26/2019   TRIG 80 01/26/2019   Lab Results  Component Value Date   WBC 5.9 10/07/2018   HGB 13.5 10/07/2018   HCT 42.9 10/07/2018   MCV 81 10/07/2018   Attestation Statements:   Reviewed by clinician on day of visit: allergies, medications, problem list, medical history, surgical history, family history, social history, and previous encounter notes.  I, 12/07/2018, CMA, am acting as transcriptionist for Insurance claims handler, MD.  I have reviewed the above documentation for accuracy and completeness, and I agree with the above. - Reuben Likes, MD

## 2019-08-04 MED ORDER — VITAMIN D (ERGOCALCIFEROL) 1.25 MG (50000 UNIT) PO CAPS: 50000.0000 [IU] | ORAL_CAPSULE | ORAL | 0 refills | Status: DC

## 2019-08-24 ENCOUNTER — Other Ambulatory Visit: Payer: Self-pay

## 2019-08-24 ENCOUNTER — Ambulatory Visit (INDEPENDENT_AMBULATORY_CARE_PROVIDER_SITE_OTHER): Payer: BC Managed Care – PPO | Admitting: Family Medicine

## 2019-08-24 ENCOUNTER — Encounter (INDEPENDENT_AMBULATORY_CARE_PROVIDER_SITE_OTHER): Payer: Self-pay | Admitting: Family Medicine

## 2019-08-24 VITALS — BP 127/89 | HR 95 | Temp 99.2°F | Ht 66.0 in | Wt 234.0 lb

## 2019-08-24 DIAGNOSIS — I10 Essential (primary) hypertension: Secondary | ICD-10-CM | POA: Diagnosis not present

## 2019-08-24 DIAGNOSIS — E7849 Other hyperlipidemia: Secondary | ICD-10-CM

## 2019-08-24 DIAGNOSIS — Z9189 Other specified personal risk factors, not elsewhere classified: Secondary | ICD-10-CM

## 2019-08-24 DIAGNOSIS — Z6837 Body mass index (BMI) 37.0-37.9, adult: Secondary | ICD-10-CM

## 2019-08-24 MED ORDER — CHLORTHALIDONE 25 MG PO TABS: 12.5000 mg | ORAL_TABLET | Freq: Every day | ORAL | 0 refills | Status: DC

## 2019-08-26 NOTE — Progress Notes (Signed)
Chief Complaint:   OBESITY Mackenzie Watson is here to discuss her progress with her obesity treatment plan along with follow-up of her obesity related diagnoses. Mackenzie Watson is on the [employer] and states she is following her eating plan approximately 75% of the time. Mackenzie Watson states she is walking for 60 minutes 1-2 times per week.  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 She did indulge in more sweets since her last appointment, at [employer]. She is not getting all of her water in secondary to not having time at work. She denies hunger.  Subjective:   1. Essential hypertension Mackenzie Watson's blood pressure is well controlled. She denies chest pain, chest pressure, or headaches. She is on chlorthalidone.  2. Other hyperlipidemia Mackenzie Watson's last LDL was 83 at her last lab check on 01/26/2019. She is not on statin. She denies a history of diabetes and she is a nonsmoker.  3. At risk for heart disease Mackenzie Watson is at a higher than average risk for cardiovascular disease due to obesity.   Assessment/Plan:   1. Essential hypertension Mackenzie Watson is working on healthy weight loss and exercise to improve blood pressure control. We will watch for signs of hypotension as she continues her lifestyle modifications. We will refill chlorthalidone for 1 month.  - chlorthalidone (HYGROTON) 25 MG tablet; Take 0.5 tablets (12.5 mg total) by mouth daily.  Dispense: 45 tablet; Refill: 0  2. Other hyperlipidemia Cardiovascular risk and specific lipid/LDL goals reviewed. We discussed several lifestyle modifications today and Mackenzie Watson will continue to work on diet, exercise and weight loss efforts. We will  repeat labs in mid June. Orders and follow up as documented in patient record.   Counseling Intensive lifestyle modifications are the first line treatment for this issue. . Dietary changes: Increase soluble fiber. Decrease simple carbohydrates. . Exercise changes: Moderate to vigorous-intensity aerobic activity 150 minutes per week if tolerated. . Lipid-lowering medications: see documented in medical record.  3. At risk for heart disease Mackenzie Watson was given approximately 15 minutes of coronary artery disease prevention counseling today. She is 49 y.o. female and has risk factors for heart disease including obesity. We discussed intensive lifestyle modifications today with an emphasis on specific weight loss instructions and strategies.   Repetitive spaced learning was employed today to elicit superior memory formation and behavioral change.  4. Class 2 severe obesity with serious comorbidity and body mass index (BMI) of 37.0 to 37.9 in adult, unspecified obesity type [facility]) Mackenzie Watson is currently in the action stage of change. As such, her goal is to continue with weight loss efforts. She has agreed to the [employer].   Exercise goals: As is, Mackenzie Watson is to try to add one 15 minute walk in addition.  Behavioral modification strategies: increasing lean protein intake, increasing vegetables, meal planning and cooking strategies and keeping healthy foods in the home.  Mackenzie Watson has agreed to follow-up with our clinic in 2 weeks. She was informed of the importance of frequent follow-up visits to maximize her success with intensive lifestyle modifications for her multiple health conditions.   Objective:   Blood pressure 127/89, pulse 95, temperature 99.2 F (37.3 C), temperature source Oral, height 5\' 6"  (1.676  m), weight 234 lb (106.1 kg), last menstrual period 06/06/2018, SpO2 96 %. Body mass index is 37.77 kg/m.  General: Cooperative, alert, well developed, in no acute distress. HEENT:  Conjunctivae and lids unremarkable. Cardiovascular: Regular rhythm.  Lungs: Normal work of breathing. Neurologic: No focal deficits.   Lab Results  Component Value Date   CREATININE 0.91 07/13/2019   BUN 9 07/13/2019   NA 144 07/13/2019   K 4.3 07/13/2019   CL 104 07/13/2019   CO2 23 07/13/2019   Lab Results  Component Value Date   ALT 15 07/13/2019   AST 17 07/13/2019   ALKPHOS 70 07/13/2019   BILITOT 0.5 07/13/2019   Lab Results  Component Value Date   HGBA1C 5.7 (H) 07/13/2019   HGBA1C 5.7 (H) 01/26/2019   HGBA1C 5.8 (H) 10/07/2018   Lab Results  Component Value Date   INSULIN 11.2 07/13/2019   INSULIN 17.4 01/26/2019   INSULIN 16.2 10/07/2018   Lab Results  Component Value Date   TSH 1.310 10/07/2018   Lab Results  Component Value Date   CHOL 143 01/26/2019   HDL 44 01/26/2019   LDLCALC 83 01/26/2019   TRIG 80 01/26/2019   Lab Results  Component Value Date   WBC 5.9 10/07/2018   HGB 13.5 10/07/2018   HCT 42.9 10/07/2018   MCV 81 10/07/2018   No results found for: IRON, TIBC, FERRITIN  Attestation Statements:   Reviewed by clinician on day of visit: allergies, medications, problem list, medical history, surgical history, family history, social history, and previous encounter notes.   I, Trixie Dredge, am acting as transcriptionist for April Manson, MD.  I have reviewed the above documentation for accuracy and completeness, and I agree with the above. - Ilene Qua, MD

## 2019-09-15 ENCOUNTER — Encounter (INDEPENDENT_AMBULATORY_CARE_PROVIDER_SITE_OTHER): Payer: Self-pay | Admitting: Family Medicine

## 2019-09-15 ENCOUNTER — Ambulatory Visit (INDEPENDENT_AMBULATORY_CARE_PROVIDER_SITE_OTHER): Payer: BC Managed Care – PPO | Admitting: Family Medicine

## 2019-09-15 ENCOUNTER — Other Ambulatory Visit: Payer: Self-pay

## 2019-09-15 VITALS — BP 111/76 | HR 78 | Temp 98.0°F | Ht 66.0 in | Wt 237.0 lb

## 2019-09-15 DIAGNOSIS — I10 Essential (primary) hypertension: Secondary | ICD-10-CM

## 2019-09-15 DIAGNOSIS — Z6838 Body mass index (BMI) 38.0-38.9, adult: Secondary | ICD-10-CM

## 2019-09-15 DIAGNOSIS — R7303 Prediabetes: Secondary | ICD-10-CM

## 2019-09-16 NOTE — Progress Notes (Signed)
Chief Complaint:   OBESITY Mackenzie Watson is here to discuss her progress with her obesity treatment plan along with follow-up of her obesity related diagnoses. Mackenzie Watson is on the [employer] and states she is following her eating plan approximately 70-75% of the time. Mackenzie Watson states she is exercising for 0 minutes 0 times per week.  Today's visit was #: 16 Starting weight: 249 lbs Starting date: 10/07/2018 Today's weight: 237 lbs Today's date: 09/15/2019 Total lbs lost to date: 12 lbs Total lbs lost since last in-office visit: 0  Interim History: Mackenzie Watson says that [employer] last week led to many indulgences.  There was a food truck, [employer] sandwich, [employer], and snacks throughout the week.  She had a weight gain of 3.4 pounds of water.  The next few weeks is the start of testing, so she needs to hold off on exercise until then.  Subjective:   1. Prediabetes Mackenzie Watson has a diagnosis of prediabetes based on her elevated HgA1c and was informed this puts her at greater risk of developing diabetes. She continues to work on diet and exercise to decrease her risk of diabetes. She denies nausea or hypoglycemia.  She is not on metformin.  Last labs were in early March.  Lab Results  Component Value Date   HGBA1C 5.7 (H) 07/13/2019   Lab Results  Component Value Date   INSULIN 11.2 07/13/2019   INSULIN 17.4 01/26/2019   INSULIN 16.2 10/07/2018   2. Essential hypertension Review: taking medications as instructed, no medication side effects noted, no chest pain on exertion, no dyspnea on exertion, no swelling of ankles.  Blood pressure is well-controlled.  BP Readings from Last 3 Encounters:  09/15/19 111/76  08/24/19 127/89  08/03/19 122/84   Assessment/Plan:   1. Prediabetes Mackenzie Watson will continue to work on weight loss, exercise, and decreasing simple carbohydrates to help decrease the risk of diabetes.  Will follow-up labs in early July.  2.  Essential hypertension Mackenzie Watson is working on healthy weight loss and exercise to improve blood pressure control. We will watch for signs of hypotension as she continues her lifestyle modifications.  Continue chlorthalidone.  3. Class 2 severe obesity with serious comorbidity and body mass index (BMI) of 38.0 to 38.9 in adult, unspecified obesity type Mackenzie Watson) Mackenzie Watson is currently in the action stage of change. As such, her goal is to continue with weight loss efforts. She has agreed to the [employer].   Exercise goals: All adults should avoid inactivity. Some physical activity is better than none, and adults who participate in any amount of physical activity gain some health benefits.  Behavioral modification strategies: increasing lean protein intake, increasing vegetables, meal planning and cooking strategies, keeping healthy foods in the home and planning for success.  Mackenzie Watson has agreed to follow-up with our clinic in 2-3 weeks. She was informed of the importance of frequent follow-up visits to maximize her success with intensive lifestyle modifications for her multiple health conditions.   Objective:   Blood pressure 111/76, pulse 78, temperature 98 F (36.7 C), temperature source Oral, height 5\' 6"  (1.676 m), weight 237 lb (107.5 kg), SpO2 98 %. Body mass index is 38.25 kg/m.  General: Cooperative, alert, well developed, in no acute distress. HEENT: Conjunctivae and lids unremarkable. Cardiovascular: Regular rhythm.  Lungs: Normal work of breathing. Neurologic: No focal deficits.   Lab Results  Component Value Date   CREATININE 0.91 07/13/2019   BUN 9 07/13/2019   NA 144 07/13/2019  K 4.3 07/13/2019   CL 104 07/13/2019   CO2 23 07/13/2019   Lab Results  Component Value Date   ALT 15 07/13/2019   AST 17 07/13/2019   ALKPHOS 70 07/13/2019   BILITOT 0.5 07/13/2019   Lab Results  Component Value Date   HGBA1C 5.7 (H) 07/13/2019   HGBA1C 5.7 (H) 01/26/2019   HGBA1C  5.8 (H) 10/07/2018   Lab Results  Component Value Date   INSULIN 11.2 07/13/2019   INSULIN 17.4 01/26/2019   INSULIN 16.2 10/07/2018   Lab Results  Component Value Date   TSH 1.310 10/07/2018   Lab Results  Component Value Date   CHOL 143 01/26/2019   HDL 44 01/26/2019   LDLCALC 83 01/26/2019   TRIG 80 01/26/2019   Lab Results  Component Value Date   WBC 5.9 10/07/2018   HGB 13.5 10/07/2018   HCT 42.9 10/07/2018   MCV 81 10/07/2018   Attestation Statements:   Reviewed by clinician on day of visit: allergies, medications, problem list, medical history, surgical history, family history, social history, and previous encounter notes.  Time spent on visit including pre-visit chart review and post-visit care and charting was 15 minutes.   I, Water quality scientist, CMA, am acting as transcriptionist for Coralie Common, MD.  I have reviewed the above documentation for accuracy and completeness, and I agree with the above. - Jinny Blossom, MD

## 2019-10-12 ENCOUNTER — Ambulatory Visit (INDEPENDENT_AMBULATORY_CARE_PROVIDER_SITE_OTHER): Payer: BC Managed Care – PPO | Admitting: Family Medicine

## 2019-10-14 ENCOUNTER — Other Ambulatory Visit: Payer: Self-pay

## 2019-10-14 ENCOUNTER — Encounter (INDEPENDENT_AMBULATORY_CARE_PROVIDER_SITE_OTHER): Payer: Self-pay | Admitting: Family Medicine

## 2019-10-14 ENCOUNTER — Ambulatory Visit (INDEPENDENT_AMBULATORY_CARE_PROVIDER_SITE_OTHER): Payer: BC Managed Care – PPO | Admitting: Family Medicine

## 2019-10-14 VITALS — BP 149/85 | HR 68 | Temp 97.7°F | Ht 66.0 in | Wt 241.0 lb

## 2019-10-14 DIAGNOSIS — E559 Vitamin D deficiency, unspecified: Secondary | ICD-10-CM | POA: Diagnosis not present

## 2019-10-14 DIAGNOSIS — I10 Essential (primary) hypertension: Secondary | ICD-10-CM | POA: Diagnosis not present

## 2019-10-14 DIAGNOSIS — Z9189 Other specified personal risk factors, not elsewhere classified: Secondary | ICD-10-CM

## 2019-10-14 DIAGNOSIS — Z6838 Body mass index (BMI) 38.0-38.9, adult: Secondary | ICD-10-CM

## 2019-10-14 MED ORDER — VITAMIN D (ERGOCALCIFEROL) 1.25 MG (50000 UNIT) PO CAPS: 50000.0000 [IU] | ORAL_CAPSULE | ORAL | 0 refills | Status: DC

## 2019-10-18 NOTE — Progress Notes (Signed)
Chief Complaint:   Mackenzie Watson is here to discuss her progress with her obesity treatment plan along with follow-up of her obesity related diagnoses. Mackenzie Watson is on the [employer] and states she is following her eating plan approximately 60% of the time. [name] states she is walking 15-20 minutes 2 times per week.  Today's visit was #: 22 Starting weight: 249 lbs Starting date: 10/07/2018 Today's weight: 241 lbs Today's date: 10/14/2019 Total lbs lost to date: 8 Total lbs lost since last in-office visit: 0  Interim History: Mackenzie Watson has been trying to just finish school and is now trying to reset. Food for the plan is at home except for fresh salad greens. She is in summer school and realizes she needs to prep and plan. She foresees no obstacles at this time.   Subjective:   Vitamin D deficiency. No nausea, vomiting, or muscle weakness, but she endorses fatigue. Last Vitamin D was 51.8 on 07/13/2019.  Essential hypertension. Blood pressure is slightly elevated today. No chest pain, chest pressure, or headache.   BP Readings from Last 3 Encounters:  10/14/19 (!) 149/85  09/15/19 111/76  08/24/19 127/89   Lab Results  Component Value Date   CREATININE 0.91 07/13/2019   CREATININE 0.97 01/26/2019   CREATININE 0.73 10/07/2018   At risk for osteoporosis. Mackenzie Watson is at higher risk of osteopenia and osteoporosis due to Vitamin D deficiency.   Assessment/Plan:   Vitamin D deficiency. Low Vitamin D level contributes to fatigue and are associated with obesity, breast, and colon cancer. She was given a refill on her Vitamin D, Ergocalciferol, (DRISDOL) 1.25 MG (50000 UNIT) CAPS capsule every 14 days #4 with 0 refills and will follow-up for routine testing of Vitamin D, at least 2-3 times per year to avoid over-replacement.   Essential hypertension. [name] is working on healthy weight loss and exercise to improve blood pressure control. We will watch for signs of  hypotension as she continues her lifestyle modifications. She will continue chlorthalidone as directed.  At risk for osteoporosis. Mackenzie Watson was given approximately 15 minutes of osteoporosis prevention counseling today. Mackenzie Watson is at risk for osteopenia and osteoporosis due to her Vitamin D deficiency. She was encouraged to take her Vitamin D and follow her higher calcium diet and increase strengthening exercise to help strengthen her bones and decrease her risk of osteopenia and osteoporosis.  Repetitive spaced learning was employed today to elicit superior memory formation and behavioral change.  Class 2 severe obesity with serious comorbidity and body mass index (BMI) of 38.0 to 38.9 in adult, unspecified obesity type ([place]).  Mackenzie Watson is currently in the action stage of change. As such, her goal is to continue with weight loss efforts. She has agreed to the [employer].   Exercise goals: All adults should avoid inactivity. Some physical activity is better than none, and adults who participate in any amount of physical activity gain some health benefits.  Behavioral modification strategies: increasing lean protein intake, meal planning and cooking strategies, keeping healthy foods in the home and planning for success.  Mackenzie Watson has agreed to follow-up with our clinic in 2-3 weeks. She was informed of the importance of frequent follow-up visits to maximize her success with intensive lifestyle modifications for her multiple health conditions.   Objective:   Blood pressure (!) 149/85, pulse 68, temperature 97.7 F (36.5 C), temperature source Oral, height 5\' 6"  (1.676 m), weight 241 lb (109.3 kg), SpO2 97 %. Body mass index is  38.9 kg/m.  General: Cooperative, alert, well developed, in no acute distress. HEENT: Conjunctivae and lids unremarkable. Cardiovascular: Regular rhythm.  Lungs: Normal work of breathing. Neurologic: No focal deficits.   Lab Results  Component Value Date    CREATININE 0.91 07/13/2019   BUN 9 07/13/2019   NA 144 07/13/2019   K 4.3 07/13/2019   CL 104 07/13/2019   CO2 23 07/13/2019   Lab Results  Component Value Date   ALT 15 07/13/2019   AST 17 07/13/2019   ALKPHOS 70 07/13/2019   BILITOT 0.5 07/13/2019   Lab Results  Component Value Date   HGBA1C 5.7 (H) 07/13/2019   HGBA1C 5.7 (H) 01/26/2019   HGBA1C 5.8 (H) 10/07/2018   Lab Results  Component Value Date   INSULIN 11.2 07/13/2019   INSULIN 17.4 01/26/2019   INSULIN 16.2 10/07/2018   Lab Results  Component Value Date   TSH 1.310 10/07/2018   Lab Results  Component Value Date   CHOL 143 01/26/2019   HDL 44 01/26/2019   LDLCALC 83 01/26/2019   TRIG 80 01/26/2019   Lab Results  Component Value Date   WBC 5.9 10/07/2018   HGB 13.5 10/07/2018   HCT 42.9 10/07/2018   MCV 81 10/07/2018   No results found for: IRON, TIBC, FERRITIN  Attestation Statements:   Reviewed by clinician on day of visit: allergies, medications, problem list, medical history, surgical history, family history, social history, and previous encounter notes.  I, Mackenzie Watson, am acting as transcriptionist for Mackenzie Likes, MD   I have reviewed the above documentation for accuracy and completeness, and I agree with the above. - Katherina Mires, MD

## 2019-11-04 ENCOUNTER — Ambulatory Visit (INDEPENDENT_AMBULATORY_CARE_PROVIDER_SITE_OTHER): Payer: BC Managed Care – PPO | Admitting: Adult Health

## 2019-11-04 ENCOUNTER — Encounter (INDEPENDENT_AMBULATORY_CARE_PROVIDER_SITE_OTHER): Payer: Self-pay | Admitting: Adult Health

## 2019-11-04 ENCOUNTER — Other Ambulatory Visit: Payer: Self-pay

## 2019-11-04 VITALS — BP 136/91 | HR 72 | Temp 97.9°F | Ht 66.0 in | Wt 241.0 lb

## 2019-11-04 DIAGNOSIS — E559 Vitamin D deficiency, unspecified: Secondary | ICD-10-CM

## 2019-11-04 DIAGNOSIS — R7303 Prediabetes: Secondary | ICD-10-CM | POA: Diagnosis not present

## 2019-11-04 DIAGNOSIS — I10 Essential (primary) hypertension: Secondary | ICD-10-CM | POA: Diagnosis not present

## 2019-11-04 DIAGNOSIS — Z6838 Body mass index (BMI) 38.0-38.9, adult: Secondary | ICD-10-CM

## 2019-11-09 NOTE — Progress Notes (Addendum)
Chief Complaint:   OBESITY Mackenzie Watson is here to discuss her progress with her obesity treatment plan along with follow-up of her obesity related diagnoses. Mackenzie Watson is on the [employer] and states she is following her eating plan approximately 75% of the time. [name] states she is exercising 0 minutes 0 times per week.  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 She focuses on protein at each meal and enjoys fish and various shellfish. She reports increased snacking, especially late at night when working on certification coursework, however, she tries to focus on the healthiest options possible.  Subjective:   Vitamin D deficiency. Stephenie is on prescription strength Vitamin D supplementation and denies nausea, vomiting, or muscle weakness.   Ref. Range 07/13/2019 09:45  Vitamin D, 25-Hydroxy Latest Ref Range: 30.0 - 100.0 ng/mL 51.8   Prediabetes. Mackenzie Watson has a diagnosis of prediabetes based on her elevated HgA1c and was informed this puts her at greater risk of developing diabetes. She continues to work on diet and exercise to decrease her risk of diabetes. She denies nausea or hypoglycemia. No polyphagia. Mackenzie Watson is not on metformin.  Lab Results  Component Value Date   HGBA1C 5.7 (H) 07/13/2019   Lab Results  Component Value Date   INSULIN 11.2 07/13/2019   INSULIN 17.4 01/26/2019   INSULIN 16.2 10/07/2018   Essential hypertension. Blood pressure is improved from her last office visit, however, diastolic is still slightly elevated. Mackenzie Watson is on chlorthalidone 25 mg 1/2 tab daily and tolerating it well.  BP Readings from  Last 3 Encounters:  11/04/19 (!) 136/91  10/14/19 (!) 149/85  09/15/19 111/76   Lab Results  Component Value Date   CREATININE 0.91 07/13/2019   CREATININE 0.97 01/26/2019   CREATININE 0.73 10/07/2018   Assessment/Plan:   Vitamin D deficiency. Low Vitamin D level contributes to fatigue and are associated with obesity, breast, and colon cancer. She agrees to continue to take prescription Vitamin D as directed (no medication refill today) and will follow-up for routine testing of Vitamin D at her next office visit.  Prediabetes. Serrita will continue to work on weight loss, exercise, and decreasing simple carbohydrates to help decrease the risk of diabetes. She will have labs checked at her next office visit.  Essential hypertension. Mackenzie Watson is working on healthy weight loss and exercise to improve blood pressure control. We will watch for signs of hypotension as she continues her lifestyle modifications. She will continue her chlorthalidone as directed and will have labs checked at her next office visit.  Class 2 severe obesity with serious comorbidity and body mass index (BMI) of 38.0 to 38.9 in adult, unspecified obesity type ([facility]).  Mackenzie Watson is currently in the action stage of change. As such, her goal is to continue with weight loss efforts. She has agreed to the [employer].   Exercise goals: No exercise has been prescribed at this time.  Behavioral modification strategies: increasing lean protein intake, increasing water intake, meal planning and cooking strategies, better snacking choices and planning for success.  Mackenzie Watson has agreed to follow-up with our clinic in 2-3 weeks. She was informed of the importance of frequent follow-up visits  to maximize her success with intensive lifestyle modifications for her multiple health conditions.   Objective:   Blood pressure (!) 136/91, pulse 72, temperature 97.9 F (36.6 C), temperature source Oral, height 5\' 6"  (1.676 m), weight 241  lb (109.3 kg), SpO2 96 %. Body mass index is 38.9 kg/m.  General: Cooperative, alert, well developed, in no acute distress. HEENT: Conjunctivae and lids unremarkable. Cardiovascular: Regular rhythm.  Lungs: Normal work of breathing. Neurologic: No focal deficits.   Lab Results  Component Value Date   CREATININE 0.91 07/13/2019   BUN 9 07/13/2019   NA 144 07/13/2019   K 4.3 07/13/2019   CL 104 07/13/2019   CO2 23 07/13/2019   Lab Results  Component Value Date   ALT 15 07/13/2019   AST 17 07/13/2019   ALKPHOS 70 07/13/2019   BILITOT 0.5 07/13/2019   Lab Results  Component Value Date   HGBA1C 5.7 (H) 07/13/2019   HGBA1C 5.7 (H) 01/26/2019   HGBA1C 5.8 (H) 10/07/2018   Lab Results  Component Value Date   INSULIN 11.2 07/13/2019   INSULIN 17.4 01/26/2019   INSULIN 16.2 10/07/2018   Lab Results  Component Value Date   TSH 1.310 10/07/2018   Lab Results  Component Value Date   CHOL 143 01/26/2019   HDL 44 01/26/2019   LDLCALC 83 01/26/2019   TRIG 80 01/26/2019   Lab Results  Component Value Date   WBC 5.9 10/07/2018   HGB 13.5 10/07/2018   HCT 42.9 10/07/2018   MCV 81 10/07/2018   No results found for: IRON, TIBC, FERRITIN  Attestation Statements:   Reviewed by clinician on day of visit: allergies, medications, problem list, medical history, surgical history, family history, social history, and previous encounter notes.  Time spent on visit including pre-visit chart review and post-visit charting and care was 29 minutes.   I, 12/07/2018, am acting as [employer] for Energy manager, NP-C   I have reviewed the above documentation for accuracy and completeness, and I agree with the above. -  [employer], NP

## 2019-11-25 ENCOUNTER — Ambulatory Visit (INDEPENDENT_AMBULATORY_CARE_PROVIDER_SITE_OTHER): Payer: BC Managed Care – PPO | Admitting: Family Medicine

## 2019-12-14 ENCOUNTER — Ambulatory Visit (INDEPENDENT_AMBULATORY_CARE_PROVIDER_SITE_OTHER): Payer: BC Managed Care – PPO | Admitting: Family Medicine

## 2019-12-14 ENCOUNTER — Encounter (INDEPENDENT_AMBULATORY_CARE_PROVIDER_SITE_OTHER): Payer: Self-pay | Admitting: Family Medicine

## 2019-12-14 ENCOUNTER — Other Ambulatory Visit: Payer: Self-pay

## 2019-12-14 VITALS — BP 119/83 | HR 86 | Temp 97.5°F | Ht 66.0 in | Wt 241.0 lb

## 2019-12-14 DIAGNOSIS — E559 Vitamin D deficiency, unspecified: Secondary | ICD-10-CM

## 2019-12-14 DIAGNOSIS — Z6838 Body mass index (BMI) 38.0-38.9, adult: Secondary | ICD-10-CM

## 2019-12-14 DIAGNOSIS — R7303 Prediabetes: Secondary | ICD-10-CM

## 2019-12-14 DIAGNOSIS — Z9189 Other specified personal risk factors, not elsewhere classified: Secondary | ICD-10-CM | POA: Diagnosis not present

## 2019-12-14 DIAGNOSIS — E7849 Other hyperlipidemia: Secondary | ICD-10-CM

## 2019-12-14 MED ORDER — WEGOVY 0.25 MG/0.5ML ~~LOC~~ SOAJ: 0.2500 mg | SUBCUTANEOUS | 0 refills | Status: DC

## 2019-12-15 LAB — COMPREHENSIVE METABOLIC PANEL
ALT: 16 IU/L (ref 0–32)
AST: 15 IU/L (ref 0–40)
Albumin/Globulin Ratio: 1.4 (ref 1.2–2.2)
Albumin: 4.4 g/dL (ref 3.8–4.8)
Alkaline Phosphatase: 76 IU/L (ref 48–121)
BUN/Creatinine Ratio: 15 (ref 9–23)
BUN: 13 mg/dL (ref 6–24)
Bilirubin Total: 0.6 mg/dL (ref 0.0–1.2)
CO2: 23 mmol/L (ref 20–29)
Calcium: 9.7 mg/dL (ref 8.7–10.2)
Chloride: 102 mmol/L (ref 96–106)
Creatinine, Ser: 0.87 mg/dL (ref 0.57–1.00)
GFR calc Af Amer: 91 mL/min/{1.73_m2} (ref >59)
GFR calc non Af Amer: 79 mL/min/{1.73_m2} (ref >59)
Globulin, Total: 3.2 g/dL (ref 1.5–4.5)
Glucose: 89 mg/dL (ref 65–99)
Potassium: 3.8 mmol/L (ref 3.5–5.2)
Sodium: 140 mmol/L (ref 134–144)
Total Protein: 7.6 g/dL (ref 6.0–8.5)

## 2019-12-15 LAB — VITAMIN D 25 HYDROXY (VIT D DEFICIENCY, FRACTURES): Vit D, 25-Hydroxy: 54 ng/mL (ref 30.0–100.0)

## 2019-12-15 LAB — HEMOGLOBIN A1C
Est. average glucose Bld gHb Est-mCnc: 117 mg/dL
Hgb A1c MFr Bld: 5.7 % — ABNORMAL HIGH (ref 4.8–5.6)

## 2019-12-15 LAB — INSULIN, RANDOM: INSULIN: 14.9 u[IU]/mL (ref 2.6–24.9)

## 2019-12-15 LAB — LIPID PANEL WITH LDL/HDL RATIO
Cholesterol, Total: 158 mg/dL (ref 100–199)
HDL: 48 mg/dL (ref >39)
LDL Chol Calc (NIH): 94 mg/dL (ref 0–99)
LDL/HDL Ratio: 2 ratio (ref 0.0–3.2)
Triglycerides: 82 mg/dL (ref 0–149)
VLDL Cholesterol Cal: 16 mg/dL (ref 5–40)

## 2019-12-15 NOTE — Progress Notes (Signed)
Chief Complaint:   OBESITY Mackenzie Watson is here to discuss her progress with her obesity treatment plan along with follow-up of her obesity related diagnoses. Mackenzie Watson is on the [employer] and states she is following her eating plan approximately 60-70% of the time. Mackenzie Watson states she is walking for 30-60 minutes 2-3 times per week.  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 She started getting containers ready and prepping what she will eat at work and what she will eat at home. She has already started getting herself into a routine so it will be easier to follow.  Subjective:   1. Vitamin D deficiency Mackenzie Watson denies nausea, vomiting, or muscle weakness, but she notes fatigue. She is on prescription Vit D, and last Vit D level was 51.8.  2. Pre-diabetes Mackenzie Watson's last A1c was 5.7 and insulin 11.2. She is not on medications.  3. Other hyperlipidemia Mackenzie Watson's LDL was previously elevated, and she is not on statin.  4. At risk for diabetes mellitus Mackenzie Watson is at higher than average risk for developing diabetes due to her obesity.   Assessment/Plan:   1. Vitamin D deficiency Low Vitamin D level contributes to fatigue and are associated with obesity, breast, and colon cancer. We will check labs today. Delaine will follow-up for routine testing of Vitamin D, at least 2-3 times per year to avoid over-replacement.  - VITAMIN D 25 Hydroxy (Vit-D Deficiency, Fractures)  2. Pre-diabetes Mackenzie Watson will continue to work on weight loss, exercise, and decreasing simple carbohydrates to help decrease the risk of diabetes. We will check labs today.  - Comprehensive metabolic panel - Hemoglobin A1c - Insulin, random  3. Other  hyperlipidemia Cardiovascular risk and specific lipid/LDL goals reviewed. We discussed several lifestyle modifications today and Mackenzie Watson will continue to work on diet, exercise and weight loss efforts. We will check labs today. Orders and follow up as documented in patient record.   Counseling Intensive lifestyle modifications are the first line treatment for this issue. . Dietary changes: Increase soluble fiber. Decrease simple carbohydrates. . Exercise changes: Moderate to vigorous-intensity aerobic activity 150 minutes per week if tolerated. . Lipid-lowering medications: see documented in medical record.  - Lipid Panel With LDL/HDL Ratio  4. At risk for diabetes mellitus Mackenzie Watson was given approximately 15 minutes of diabetes education and counseling today. We discussed intensive lifestyle modifications today with an emphasis on weight loss as well as increasing exercise and decreasing simple carbohydrates in her diet. We also reviewed medication options with an emphasis on risk versus benefit of those discussed.   Repetitive spaced learning was employed today to elicit superior memory formation and behavioral change.  5. Class 2 severe obesity with serious comorbidity and body mass index (BMI) of 38.0 to 38.9 in adult, unspecified obesity type [facility]) Mackenzie Watson is currently in the action stage of change. As such, her goal is to continue with weight loss efforts. She has agreed to the [employer].   We discussed various medication options to help Mackenzie Watson with her weight loss efforts and we both agreed to start Wegovy 0.25 mg SubQ weekly with no refills.  - Semaglutide-Weight Management (WEGOVY) 0.25 MG/0.5ML SOAJ; Inject 0.25 mg into the skin once a week.  Dispense: 2 mL; Refill: 0  Exercise goals: All adults should avoid inactivity. Some physical activity is better than none, and adults who participate in any amount of physical activity gain some health benefits.  Behavioral modification  strategies: increasing lean protein intake, meal planning and cooking strategies, keeping healthy foods in the home and avoiding temptations.  Mackenzie Watson has agreed to follow-up with our clinic in 3 weeks. She was informed of the importance of frequent follow-up visits to maximize her success with intensive lifestyle modifications for her multiple health conditions.   Mackenzie Watson was informed we would discuss her lab results at her next visit unless there is a critical issue that needs to be addressed sooner. Mackenzie Watson agreed to keep her next visit at the agreed upon time to discuss these results.  Objective:   Blood pressure 119/83, pulse 86, temperature (!) 97.5 F (36.4 C), temperature source Oral, height 5\' 6"  (1.676 m), weight 241 lb (109.3 kg), SpO2 98 %. Body mass index is 38.9 kg/m.  General: Cooperative, alert, well developed, in no acute distress. HEENT: Conjunctivae and lids unremarkable. Cardiovascular: Regular rhythm.  Lungs: Normal work of breathing. Neurologic: No focal deficits.   Lab Results  Component Value Date   CREATININE 0.87 12/14/2019   BUN 13 12/14/2019   NA 140 12/14/2019   K 3.8 12/14/2019   CL 102 12/14/2019   CO2 23 12/14/2019   Lab Results  Component Value Date   ALT 16 12/14/2019   AST 15 12/14/2019   ALKPHOS 76 12/14/2019   BILITOT 0.6 12/14/2019   Lab Results  Component Value Date   HGBA1C 5.7 (H) 12/14/2019   HGBA1C 5.7 (H) 07/13/2019   HGBA1C 5.7 (H) 01/26/2019   HGBA1C 5.8 (H) 10/07/2018   Lab Results  Component Value Date   INSULIN 14.9 12/14/2019   INSULIN 11.2 07/13/2019   INSULIN 17.4 01/26/2019   INSULIN 16.2 10/07/2018   Lab Results  Component Value Date   TSH 1.310 10/07/2018   Lab Results  Component Value Date   CHOL 158 12/14/2019   HDL 48 12/14/2019   LDLCALC 94 12/14/2019   TRIG 82 12/14/2019   Lab Results  Component Value Date   WBC 5.9 10/07/2018   HGB 13.5 10/07/2018   HCT 42.9 10/07/2018   MCV 81 10/07/2018    No results found for: IRON, TIBC, FERRITIN  Attestation Statements:   Reviewed by clinician on day of visit: allergies, medications, problem list, medical history, surgical history, family history, social history, and previous encounter notes.   I, 12/07/2018, am acting as transcriptionist for Burt Knack, MD. I have reviewed the above documentation for accuracy and completeness, and I agree with the above. - Reuben Likes, MD

## 2020-01-06 ENCOUNTER — Ambulatory Visit (INDEPENDENT_AMBULATORY_CARE_PROVIDER_SITE_OTHER): Payer: BC Managed Care – PPO | Admitting: Family Medicine

## 2020-01-06 ENCOUNTER — Other Ambulatory Visit: Payer: Self-pay

## 2020-01-06 ENCOUNTER — Encounter (INDEPENDENT_AMBULATORY_CARE_PROVIDER_SITE_OTHER): Payer: Self-pay | Admitting: Family Medicine

## 2020-01-06 VITALS — BP 135/85 | HR 77 | Temp 98.0°F | Ht 66.0 in | Wt 240.0 lb

## 2020-01-06 DIAGNOSIS — I10 Essential (primary) hypertension: Secondary | ICD-10-CM

## 2020-01-06 DIAGNOSIS — Z9189 Other specified personal risk factors, not elsewhere classified: Secondary | ICD-10-CM | POA: Diagnosis not present

## 2020-01-06 DIAGNOSIS — Z6838 Body mass index (BMI) 38.0-38.9, adult: Secondary | ICD-10-CM | POA: Diagnosis not present

## 2020-01-06 DIAGNOSIS — E559 Vitamin D deficiency, unspecified: Secondary | ICD-10-CM

## 2020-01-06 MED ORDER — VITAMIN D (ERGOCALCIFEROL) 1.25 MG (50000 UNIT) PO CAPS: 50000.0000 [IU] | ORAL_CAPSULE | ORAL | 0 refills | Status: DC

## 2020-01-06 MED ORDER — CHLORTHALIDONE 25 MG PO TABS: 12.5000 mg | ORAL_TABLET | Freq: Every day | ORAL | 0 refills | Status: DC

## 2020-01-11 NOTE — Progress Notes (Signed)
Chief Complaint:   OBESITY Mackenzie Watson is here to discuss her progress with her obesity treatment plan along with follow-up of her obesity related diagnoses. Mackenzie Watson is on the [employer] and states she is following her eating plan approximately 75-80% of the time. Mackenzie Watson states she is walking the hallways and stairs 5 times per week.  Today's visit was #: 20 Starting weight: 249 lbs Starting date: 10/07/2018 Today's weight: 240 lbs Today's date: 01/06/2020 Total lbs lost to date: 9 Total lbs lost since last in-office visit: 1  Interim History: Mackenzie Watson has had a very stressful few weeks with the start of school. 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 She has had quite a bit of professional development meetings as well. She only has 25 minutes for lunch and she is often having to skip lunch and is trying to make up calories at her next meal. She is bringing food to school but is often not eating. She is still waiting on Wegovy.  Subjective:   1. Essential hypertension Ndea's blood pressure is controlled. She is on chlorthalidone 12.5 mg daily.  2. Vitamin D deficiency Mackenzie Watson denies nausea, vomiting, or muscle weakness, but she notes fatigue. She is on prescription Vit D, but last Vit D level was 54.0.  3. At risk for heart disease Mackenzie Watson is at a higher than average risk for cardiovascular disease due to obesity.   Assessment/Plan:   1. Essential hypertension Mackenzie Watson is working on healthy weight loss and exercise to improve blood pressure control. We will watch for signs of hypotension as she continues her lifestyle modifications. We will refill chlorthalidone for 1 month.  - chlorthalidone (HYGROTON) 25 MG tablet; Take 0.5 tablets (12.5 mg total) by mouth daily.  Dispense: 45 tablet; Refill: 0  2. Vitamin D deficiency Low Vitamin D level contributes to fatigue and are associated with obesity, breast, and colon cancer. We will refill  prescription Vitamin D for 2 months. Mackenzie Watson will follow-up for routine testing of Vitamin D, at least 2-3 times per year to avoid over-replacement.  - Vitamin D, Ergocalciferol, (DRISDOL) 1.25 MG (50000 UNIT) CAPS capsule; Take 1 capsule (50,000 Units total) by mouth every 14 (fourteen) days.  Dispense: 4 capsule; Refill: 0  3. At risk for heart disease Mackenzie Watson was given approximately 15 minutes of coronary artery disease prevention counseling today. She is 49 y.o. female and has risk factors for heart disease including obesity. We discussed intensive lifestyle modifications today with an emphasis on specific weight loss instructions and strategies.   Repetitive spaced learning was employed today to elicit superior memory formation and behavioral change.  4. Class 2 severe obesity with serious comorbidity and body mass index (BMI) of 38.0 to 38.9 in adult, unspecified obesity type [facility]) Mackenzie Watson is currently in the action stage of change. As such, her goal is to continue with weight loss efforts. She has agreed to the [employer].   We will repeat IC at her next appointment.  Exercise goals: All adults should avoid inactivity. Some physical activity is better than none, and adults who participate in any amount of physical activity gain some health benefits.  Behavioral modification strategies: increasing lean protein intake, meal planning and cooking strategies, keeping healthy foods in the home and planning for success.  Mackenzie Watson has agreed to follow-up with our clinic in 2 to 3 weeks. She was informed of the importance of frequent follow-up visits to  maximize her success with intensive lifestyle modifications for her multiple health conditions.   Objective:   Blood pressure 135/85, pulse 77, temperature 98 F (36.7 C), temperature source Oral, height 5\' 6"  (1.676 m), weight 240 lb (108.9 kg), SpO2 98 %. Body mass index is 38.74 kg/m.  General: Cooperative, alert, well developed, in  no acute distress. HEENT: Conjunctivae and lids unremarkable. Cardiovascular: Regular rhythm.  Lungs: Normal work of breathing. Neurologic: No focal deficits.   Lab Results  Component Value Date   CREATININE 0.87 12/14/2019   BUN 13 12/14/2019   NA 140 12/14/2019   K 3.8 12/14/2019   CL 102 12/14/2019   CO2 23 12/14/2019   Lab Results  Component Value Date   ALT 16 12/14/2019   AST 15 12/14/2019   ALKPHOS 76 12/14/2019   BILITOT 0.6 12/14/2019   Lab Results  Component Value Date   HGBA1C 5.7 (H) 12/14/2019   HGBA1C 5.7 (H) 07/13/2019   HGBA1C 5.7 (H) 01/26/2019   HGBA1C 5.8 (H) 10/07/2018   Lab Results  Component Value Date   INSULIN 14.9 12/14/2019   INSULIN 11.2 07/13/2019   INSULIN 17.4 01/26/2019   INSULIN 16.2 10/07/2018   Lab Results  Component Value Date   TSH 1.310 10/07/2018   Lab Results  Component Value Date   CHOL 158 12/14/2019   HDL 48 12/14/2019   LDLCALC 94 12/14/2019   TRIG 82 12/14/2019   Lab Results  Component Value Date   WBC 5.9 10/07/2018   HGB 13.5 10/07/2018   HCT 42.9 10/07/2018   MCV 81 10/07/2018   No results found for: IRON, TIBC, FERRITIN  Attestation Statements:   Reviewed by clinician on day of visit: allergies, medications, problem list, medical history, surgical history, family history, social history, and previous encounter notes.   I, 12/07/2018, am acting as transcriptionist for Burt Knack, MD.  I have reviewed the above documentation for accuracy and completeness, and I agree with the above. - Reuben Likes, MD

## 2020-01-26 ENCOUNTER — Encounter (INDEPENDENT_AMBULATORY_CARE_PROVIDER_SITE_OTHER): Payer: Self-pay | Admitting: Family Medicine

## 2020-01-26 ENCOUNTER — Ambulatory Visit (INDEPENDENT_AMBULATORY_CARE_PROVIDER_SITE_OTHER): Payer: BC Managed Care – PPO | Admitting: Family Medicine

## 2020-01-27 NOTE — Telephone Encounter (Signed)
Please advise 

## 2020-01-31 NOTE — Telephone Encounter (Signed)
Please advise 

## 2020-02-01 ENCOUNTER — Ambulatory Visit (INDEPENDENT_AMBULATORY_CARE_PROVIDER_SITE_OTHER): Payer: BC Managed Care – PPO | Admitting: Family Medicine

## 2020-02-01 ENCOUNTER — Encounter (INDEPENDENT_AMBULATORY_CARE_PROVIDER_SITE_OTHER): Payer: Self-pay | Admitting: Family Medicine

## 2020-02-01 ENCOUNTER — Other Ambulatory Visit: Payer: Self-pay

## 2020-02-01 VITALS — BP 129/85 | HR 70 | Temp 98.3°F | Ht 66.0 in | Wt 234.0 lb

## 2020-02-01 DIAGNOSIS — Z9189 Other specified personal risk factors, not elsewhere classified: Secondary | ICD-10-CM

## 2020-02-01 DIAGNOSIS — Z6838 Body mass index (BMI) 38.0-38.9, adult: Secondary | ICD-10-CM

## 2020-02-01 DIAGNOSIS — E559 Vitamin D deficiency, unspecified: Secondary | ICD-10-CM | POA: Diagnosis not present

## 2020-02-01 DIAGNOSIS — R0602 Shortness of breath: Secondary | ICD-10-CM

## 2020-02-01 MED ORDER — VITAMIN D (ERGOCALCIFEROL) 1.25 MG (50000 UNIT) PO CAPS: 50000.0000 [IU] | ORAL_CAPSULE | ORAL | 0 refills | Status: DC

## 2020-02-01 MED ORDER — WEGOVY 0.25 MG/0.5ML ~~LOC~~ SOAJ: 0.2500 mg | SUBCUTANEOUS | 0 refills | Status: DC

## 2020-02-02 ENCOUNTER — Encounter (INDEPENDENT_AMBULATORY_CARE_PROVIDER_SITE_OTHER): Payer: Self-pay

## 2020-02-02 NOTE — Progress Notes (Signed)
Chief Complaint:   OBESITY Mackenzie Watson is here to discuss her progress with her obesity treatment plan along with follow-up of her obesity related diagnoses. Mackenzie Watson is on the [employer] and states she is following her eating plan approximately 75-80% of the time. Mackenzie Watson states she is walking around at school.   Today's visit was #: 21 Starting weight: 249 lbs Starting date: 10/07/2018 Today's weight: 234 lbs Today's date: 02/01/2020 Total lbs lost to date: 15 Total lbs lost since last in-office visit: 6  Interim History: Mackenzie Watson has started [place], and she denies hunger. She has not been able to eat all the food on the plan. She is often missing the fruit part of meals. Breakfast is eggs, [place] sausage and cheese. Snacks is whips and then dinner but she is drinking water throughout the day.  Subjective:   1. SOB (shortness of breath) on exertion Kasee notes her shortness of breath has slightly improved from initial appointment. He hasn't started consistent activity.  2. Vitamin D deficiency Prarthana denies nausea, vomiting, or muscle weakness, but she notes fatigue. She is on prescription Vit D. Last Vit D level was 54.0.  3. At risk for deficient intake of food The patient is at a higher than average risk of deficient intake of food due to not eating all food secondary to Mackenzie Watson.  Assessment/Plan:   1. SOB (shortness of breath) on exertion IC was done today. Chessa will continue to follow up as directed.   2. Vitamin D deficiency Low Vitamin D level contributes to fatigue and are associated with obesity, breast, and colon cancer. We will refill prescription Vitamin D for 1 month. Evalyn will follow-up for routine testing of Vitamin D, at least 2-3 times per year to avoid over-replacement.  - Vitamin D, Ergocalciferol, (DRISDOL) 1.25 MG (50000 UNIT) CAPS capsule; Take 1 capsule (50,000 Units total) by mouth every 14 (fourteen) days.  Dispense: 4 capsule; Refill: 0  3.  At risk for deficient intake of food Mackenzie Watson was given approximately 15 minutes of deficit intake of food prevention counseling today. Mackenzie Watson is at risk for eating too few calories based on current food recall. She was encouraged to focus on meeting caloric and protein goals according to her recommended meal plan.   4. Class 2 severe obesity with serious comorbidity and body mass index (BMI) of 38.0 to 38.9 in adult, unspecified obesity type [facility]) Mackenzie Watson is currently in the action stage of change. As such, her goal is to continue with weight loss efforts. She has agreed to the [employer].   We discussed various medication options to help Mackenzie Watson with her weight loss efforts and we both agreed to continue [place], and we will refill for 1 month.  - Semaglutide-Weight Management (WEGOVY) 0.25 MG/0.5ML SOAJ; Inject 0.25 mg into the skin once a week.  Dispense: 2 mL; Refill: 0  Exercise goals: All adults should avoid inactivity. Some physical activity is better than none, and adults who participate in any amount of physical activity gain some health benefits.  Behavioral modification strategies: increasing lean protein intake, increasing vegetables, no skipping meals, meal planning and cooking strategies, keeping healthy foods in the home and planning for success.  Mackenzie Watson has agreed to follow-up with our clinic in 2 to 3 weeks. She was informed of the importance of frequent follow-up visits to maximize her success with intensive lifestyle modifications for her multiple health conditions.   Objective:   Blood pressure 129/85, pulse 70, temperature 98.3 F (  36.8 C), temperature source Oral, height 5\' 6"  (1.676 m), weight 234 lb (106.1 kg), SpO2 95 %. Body mass index is 37.77 kg/m.  General: Cooperative, alert, well developed, in no acute distress. HEENT: Conjunctivae and lids unremarkable. Cardiovascular: Regular rhythm.  Lungs: Normal work of breathing. Neurologic: No focal deficits.    Lab Results  Component Value Date   CREATININE 0.87 12/14/2019   BUN 13 12/14/2019   NA 140 12/14/2019   K 3.8 12/14/2019   CL 102 12/14/2019   CO2 23 12/14/2019   Lab Results  Component Value Date   ALT 16 12/14/2019   AST 15 12/14/2019   ALKPHOS 76 12/14/2019   BILITOT 0.6 12/14/2019   Lab Results  Component Value Date   HGBA1C 5.7 (H) 12/14/2019   HGBA1C 5.7 (H) 07/13/2019   HGBA1C 5.7 (H) 01/26/2019   HGBA1C 5.8 (H) 10/07/2018   Lab Results  Component Value Date   INSULIN 14.9 12/14/2019   INSULIN 11.2 07/13/2019   INSULIN 17.4 01/26/2019   INSULIN 16.2 10/07/2018   Lab Results  Component Value Date   TSH 1.310 10/07/2018   Lab Results  Component Value Date   CHOL 158 12/14/2019   HDL 48 12/14/2019   LDLCALC 94 12/14/2019   TRIG 82 12/14/2019   Lab Results  Component Value Date   WBC 5.9 10/07/2018   HGB 13.5 10/07/2018   HCT 42.9 10/07/2018   MCV 81 10/07/2018   No results found for: IRON, TIBC, FERRITIN  Attestation Statements:   Reviewed by clinician on day of visit: allergies, medications, problem list, medical history, surgical history, family history, social history, and previous encounter notes.   I, 12/07/2018, am acting as transcriptionist for Burt Knack, MD.  I have reviewed the above documentation for accuracy and completeness, and I agree with the above. - Reuben Likes, MD

## 2020-02-17 ENCOUNTER — Other Ambulatory Visit: Payer: Self-pay

## 2020-02-17 ENCOUNTER — Ambulatory Visit (INDEPENDENT_AMBULATORY_CARE_PROVIDER_SITE_OTHER): Payer: BC Managed Care – PPO | Admitting: Family Medicine

## 2020-02-17 ENCOUNTER — Encounter (INDEPENDENT_AMBULATORY_CARE_PROVIDER_SITE_OTHER): Payer: Self-pay | Admitting: Family Medicine

## 2020-02-17 VITALS — BP 134/90 | HR 88 | Temp 98.5°F | Ht 66.0 in | Wt 229.0 lb

## 2020-02-17 DIAGNOSIS — Z6837 Body mass index (BMI) 37.0-37.9, adult: Secondary | ICD-10-CM

## 2020-02-17 DIAGNOSIS — R7303 Prediabetes: Secondary | ICD-10-CM | POA: Diagnosis not present

## 2020-02-17 DIAGNOSIS — E559 Vitamin D deficiency, unspecified: Secondary | ICD-10-CM

## 2020-02-17 MED ORDER — WEGOVY 0.5 MG/0.5ML ~~LOC~~ SOAJ: 0.5000 mg | SUBCUTANEOUS | 0 refills | Status: DC

## 2020-02-22 NOTE — Progress Notes (Signed)
Chief Complaint:   OBESITY Mackenzie Watson is here to discuss her progress with her obesity treatment plan along with follow-up of her obesity related diagnoses. Mackenzie Watson is on the [employer] and states she is following her eating plan approximately 85% of the time. Mackenzie Watson states she is walking around school.  Today's visit was #: 22 Starting weight: 249 lbs Starting date: 10/07/2018 Today's weight: 229 lbs Today's date: 02/17/2020 Total lbs lost to date: 20 lbs Total lbs lost since last in-office visit: 5 lbs  Interim History: Mackenzie Watson is doing well.  Following the plan ~85%.  Denies hunger.  She is still forcing herself to eat everything.  She still needs to work on increasing her water intake.  Denies GI side effects on Wegovy.  Eating most food at the end of the day.  Plan:  Increase Wegovy to 0.5 mg subcutaneously weekly.  Subjective:   1. Prediabetes Mackenzie Watson has a diagnosis of prediabetes based on her elevated HgA1c and was informed this puts her at greater risk of developing diabetes. She continues to work on diet and exercise to decrease her risk of diabetes. She denies nausea or hypoglycemia.  She is on [employer].  Lab Results  Component Value Date   HGBA1C 5.7 (H) 12/14/2019   Lab Results  Component Value Date   INSULIN 14.9 12/14/2019   INSULIN 11.2 07/13/2019   INSULIN 17.4 01/26/2019   INSULIN 16.2 10/07/2018   2. Vitamin D deficiency Mackenzie Watson's Vitamin D level was 54.0 on 12/14/2019. She is currently taking prescription vitamin D 50,000 IU every 14 days. She denies nausea, vomiting or muscle weakness.  She endorses fatigue.  Assessment/Plan:   1. Prediabetes Mackenzie Watson will continue to work on weight loss, exercise, and decreasing simple carbohydrates to help decrease the risk of diabetes.  Continue pescatarian plan and GLP-1 with no change.  2. Vitamin D deficiency Low Vitamin D level contributes to fatigue and are associated with obesity, breast, and colon cancer.  She agrees to continue to take prescription Vitamin D @50 ,000 IU every 14 days and will follow-up for routine testing of Vitamin D, at least 2-3 times per year to avoid over-replacement.  3. Class 2 severe obesity with serious comorbidity and body mass index (BMI) of 37.0 to 37.9 in adult, unspecified obesity type (HCC)  -Refill Semaglutide-Weight Management (WEGOVY) 0.5 MG/0.5ML SOAJ; Inject 0.5 mg into the skin once a week.  Dispense: 2 mL; Refill: 0  Mackenzie Watson is currently in the action stage of change. As such, her goal is to continue with weight loss efforts. She has agreed to the Category 2 Plan and the Pescatarian Plan.   Exercise goals: As is.  Behavioral modification strategies: increasing lean protein intake, meal planning and cooking strategies, keeping healthy foods in the home and planning for success.  Mackenzie Watson has agreed to follow-up with our clinic in 3 weeks. She was informed of the importance of frequent follow-up visits to maximize her success with intensive lifestyle modifications for her multiple health conditions.   Objective:   Blood pressure 134/90, pulse 88, temperature 98.5 F (36.9 C), temperature source Oral, height 5\' 6"  (1.676 m), weight 229 lb (103.9 kg), SpO2 98 %. Body mass index is 36.96 kg/m.  General: Cooperative, alert, well developed, in no acute distress. HEENT: Conjunctivae and lids unremarkable. Cardiovascular: Regular rhythm.  Lungs: Normal work of breathing. Neurologic: No focal deficits.   Lab Results  Component Value Date   CREATININE 0.87 12/14/2019   BUN 13 12/14/2019  NA 140 12/14/2019   K 3.8 12/14/2019   CL 102 12/14/2019   CO2 23 12/14/2019   Lab Results  Component Value Date   ALT 16 12/14/2019   AST 15 12/14/2019   ALKPHOS 76 12/14/2019   BILITOT 0.6 12/14/2019   Lab Results  Component Value Date   HGBA1C 5.7 (H) 12/14/2019   HGBA1C 5.7 (H) 07/13/2019   HGBA1C 5.7 (H) 01/26/2019   HGBA1C 5.8 (H) 10/07/2018   Lab  Results  Component Value Date   INSULIN 14.9 12/14/2019   INSULIN 11.2 07/13/2019   INSULIN 17.4 01/26/2019   INSULIN 16.2 10/07/2018   Lab Results  Component Value Date   TSH 1.310 10/07/2018   Lab Results  Component Value Date   CHOL 158 12/14/2019   HDL 48 12/14/2019   LDLCALC 94 12/14/2019   TRIG 82 12/14/2019   Lab Results  Component Value Date   WBC 5.9 10/07/2018   HGB 13.5 10/07/2018   HCT 42.9 10/07/2018   MCV 81 10/07/2018   Attestation Statements:   Reviewed by clinician on day of visit: allergies, medications, problem list, medical history, surgical history, family history, social history, and previous encounter notes.  Time spent on visit including pre-visit chart review and post-visit care and charting was 18 minutes.   I, Insurance claims handler, CMA, am acting as transcriptionist for Reuben Likes, MD.  I have reviewed the above documentation for accuracy and completeness, and I agree with the above. - Katherina Mires, MD

## 2020-03-15 ENCOUNTER — Other Ambulatory Visit: Payer: Self-pay

## 2020-03-15 ENCOUNTER — Ambulatory Visit (INDEPENDENT_AMBULATORY_CARE_PROVIDER_SITE_OTHER): Payer: BC Managed Care – PPO | Admitting: Family Medicine

## 2020-03-15 ENCOUNTER — Encounter (INDEPENDENT_AMBULATORY_CARE_PROVIDER_SITE_OTHER): Payer: Self-pay | Admitting: Family Medicine

## 2020-03-15 VITALS — BP 131/90 | HR 89 | Temp 98.3°F | Ht 66.0 in | Wt 224.0 lb

## 2020-03-15 DIAGNOSIS — I10 Essential (primary) hypertension: Secondary | ICD-10-CM

## 2020-03-15 DIAGNOSIS — R7303 Prediabetes: Secondary | ICD-10-CM

## 2020-03-15 DIAGNOSIS — E559 Vitamin D deficiency, unspecified: Secondary | ICD-10-CM | POA: Diagnosis not present

## 2020-03-15 DIAGNOSIS — Z9189 Other specified personal risk factors, not elsewhere classified: Secondary | ICD-10-CM | POA: Diagnosis not present

## 2020-03-15 DIAGNOSIS — Z6836 Body mass index (BMI) 36.0-36.9, adult: Secondary | ICD-10-CM

## 2020-03-15 MED ORDER — WEGOVY 1 MG/0.5ML ~~LOC~~ SOAJ: 1.0000 mg | SUBCUTANEOUS | 0 refills | Status: DC

## 2020-03-15 MED ORDER — VITAMIN D (ERGOCALCIFEROL) 1.25 MG (50000 UNIT) PO CAPS: 50000.0000 [IU] | ORAL_CAPSULE | ORAL | 0 refills | Status: DC

## 2020-03-20 NOTE — Progress Notes (Signed)
Chief Complaint:   OBESITY Mackenzie Watson is here to discuss her progress with her obesity treatment plan along with follow-up of her obesity related diagnoses. Mackenzie Watson is on the Category 2 Plan or the Pescatarian Plan and states she is following her eating plan approximately 80-85% of the time. Mackenzie Watson states she is walking at school.   Today's visit was #: 23 Starting weight: 249 lbs Starting date: 10/07/2018 Today's weight: 224 lbs Today's date: 03/15/2020 Total lbs lost to date: 25 Total lbs lost since last in-office visit: 5  Interim History: Mackenzie Watson is really motivated to stick to the plan to continue weight loss. She is planning on going home for Thanksgiving. She finds getting all of the food in on the plan to be a challenge, but she is making a conscious effort.  Subjective:   1. Pre-diabetes Mackenzie Watson is on GLP-1, and she denies GI side effects. Last labs were in August 2021.  2. Essential hypertension Mackenzie Watson's blood pressure is well controlled. She denies chest pain, chest pressure, or headache. She is on chlorthalidone.  3. Vitamin D deficiency Mackenzie Watson denies nausea, vomiting, or muscle weakness, but notes fatigue.  4. At risk for osteoporosis Mackenzie Watson is at higher risk of osteopenia and osteoporosis due to Vitamin D deficiency.   Assessment/Plan:   1. Pre-diabetes Mackenzie Watson will continue her GLP-1, and will continue to work on weight loss, exercise, and decreasing simple carbohydrates to help decrease the risk of diabetes. We will repeat labs in January 2022.  2. Essential hypertension Mackenzie Watson will continue her medications, no change in dose. She will continue working on healthy weight loss and exercise to improve blood pressure control. We will watch for signs of hypotension as she continues her lifestyle modifications.  3. Vitamin D deficiency Low Vitamin D level contributes to fatigue and are associated with obesity, breast, and colon cancer. We will refill  prescription Vitamin D for 2 months. Mackenzie Watson will follow-up for routine testing of Vitamin D, at least 2-3 times per year to avoid over-replacement.  - Vitamin D, Ergocalciferol, (DRISDOL) 1.25 MG (50000 UNIT) CAPS capsule; Take 1 capsule (50,000 Units total) by mouth every 14 (fourteen) days.  Dispense: 4 capsule; Refill: 0  4. At risk for osteoporosis Mackenzie Watson was given approximately 15 minutes of osteoporosis prevention counseling today. Mackenzie Watson is at risk for osteopenia and osteoporosis due to her Vitamin D deficiency. She was encouraged to take her Vitamin D and follow her higher calcium diet and increase strengthening exercise to help strengthen her bones and decrease her risk of osteopenia and osteoporosis.  Repetitive spaced learning was employed today to elicit superior memory formation and behavioral change.  5. Class 2 severe obesity with serious comorbidity and body mass index (BMI) of 36.0 to 36.9 in adult, unspecified obesity type [facility]) Mackenzie Watson is currently in the action stage of change. As such, her goal is to continue with weight loss efforts. She has agreed to the [employer].   We discussed various medication options to help Mackenzie Watson with her weight loss efforts and we both agreed to increase Wegovy to 1 mg SubQ weekly #75mL with no refills.  Exercise goals: As is.  Behavioral modification strategies: increasing lean protein intake, meal planning and cooking strategies, keeping healthy foods in the home and holiday eating strategies .  Mackenzie Watson has agreed to follow-up with our clinic in 3 weeks. She was informed of the importance of frequent follow-up visits to maximize her success with intensive lifestyle modifications for her multiple health  conditions.   Objective:   Blood pressure 131/90, pulse 89, temperature 98.3 F (36.8 C), temperature source Oral, height 5\' 6"  (1.676 m), weight 224 lb (101.6 kg), SpO2 100 %. Body mass index is 36.15 kg/m.  General: Cooperative,  alert, well developed, in no acute distress. HEENT: Conjunctivae and lids unremarkable. Cardiovascular: Regular rhythm.  Lungs: Normal work of breathing. Neurologic: No focal deficits.   Lab Results  Component Value Date   CREATININE 0.87 12/14/2019   BUN 13 12/14/2019   NA 140 12/14/2019   K 3.8 12/14/2019   CL 102 12/14/2019   CO2 23 12/14/2019   Lab Results  Component Value Date   ALT 16 12/14/2019   AST 15 12/14/2019   ALKPHOS 76 12/14/2019   BILITOT 0.6 12/14/2019   Lab Results  Component Value Date   HGBA1C 5.7 (H) 12/14/2019   HGBA1C 5.7 (H) 07/13/2019   HGBA1C 5.7 (H) 01/26/2019   HGBA1C 5.8 (H) 10/07/2018   Lab Results  Component Value Date   INSULIN 14.9 12/14/2019   INSULIN 11.2 07/13/2019   INSULIN 17.4 01/26/2019   INSULIN 16.2 10/07/2018   Lab Results  Component Value Date   TSH 1.310 10/07/2018   Lab Results  Component Value Date   CHOL 158 12/14/2019   HDL 48 12/14/2019   LDLCALC 94 12/14/2019   TRIG 82 12/14/2019   Lab Results  Component Value Date   WBC 5.9 10/07/2018   HGB 13.5 10/07/2018   HCT 42.9 10/07/2018   MCV 81 10/07/2018   No results found for: IRON, TIBC, FERRITIN  Attestation Statements:   Reviewed by clinician on day of visit: allergies, medications, problem list, medical history, surgical history, family history, social history, and previous encounter notes.   I, 12/07/2018, am acting as transcriptionist for Burt Knack, MD.  I have reviewed the above documentation for accuracy and completeness, and I agree with the above. - Reuben Likes, MD

## 2020-04-10 ENCOUNTER — Encounter (INDEPENDENT_AMBULATORY_CARE_PROVIDER_SITE_OTHER): Payer: Self-pay | Admitting: Adult Health

## 2020-04-10 ENCOUNTER — Ambulatory Visit (INDEPENDENT_AMBULATORY_CARE_PROVIDER_SITE_OTHER): Payer: BC Managed Care – PPO | Admitting: Adult Health

## 2020-04-10 ENCOUNTER — Telehealth (INDEPENDENT_AMBULATORY_CARE_PROVIDER_SITE_OTHER): Payer: Self-pay

## 2020-04-10 ENCOUNTER — Other Ambulatory Visit: Payer: Self-pay

## 2020-04-10 VITALS — BP 150/86 | HR 100 | Temp 98.0°F | Ht 66.0 in | Wt 226.0 lb

## 2020-04-10 DIAGNOSIS — E559 Vitamin D deficiency, unspecified: Secondary | ICD-10-CM

## 2020-04-10 DIAGNOSIS — I10 Essential (primary) hypertension: Secondary | ICD-10-CM

## 2020-04-10 DIAGNOSIS — R7303 Prediabetes: Secondary | ICD-10-CM | POA: Diagnosis not present

## 2020-04-10 DIAGNOSIS — Z9189 Other specified personal risk factors, not elsewhere classified: Secondary | ICD-10-CM

## 2020-04-10 DIAGNOSIS — Z6836 Body mass index (BMI) 36.0-36.9, adult: Secondary | ICD-10-CM

## 2020-04-10 MED ORDER — WEGOVY 1 MG/0.5ML ~~LOC~~ SOAJ: 2.0000 mg | SUBCUTANEOUS | 0 refills | Status: DC

## 2020-04-10 MED ORDER — VITAMIN D (ERGOCALCIFEROL) 1.25 MG (50000 UNIT) PO CAPS: 50000.0000 [IU] | ORAL_CAPSULE | ORAL | 0 refills | Status: DC

## 2020-04-10 NOTE — Progress Notes (Signed)
Chief Complaint:   OBESITY Mackenzie Watson is here to discuss her progress with her obesity treatment plan along with follow-up of her obesity related diagnoses. Mackenzie Watson is on the [employer] and states she is following her eating plan approximately 75% of the time. [name] states she is exercising 0 minutes 0 times per week.  Today's visit was #: 24 Starting weight: 249 lbs Starting date: 10/07/2018 Today's weight: 226 lbs Today's date: 04/10/2020 Total lbs lost to date: 23 Total lbs lost since last in-office visit: 0  Interim History: Mackenzie Watson has been following a Ambulance person plan with additional protein sources, i.e., chicken. She reports increased carbohydrate intake over the holiday season. She has been challenged to establish a regular walking regime. Mackenzie Watson was increased to 1 mg on 03/15/2020. She denies mass in n eck, dysphagia, dyspepsia, or persistent hoarseness.  Subjective:   Vitamin D deficiency. Vitamin D level on 12/14/2019 was 54.0, at goal.   Ref. Range 12/14/2019 10:22  Vitamin D, 25-Hydroxy Latest Ref Range: 30.0 - 100.0 ng/mL 54.0   Prediabetes. Mackenzie Watson has a diagnosis of prediabetes based on her elevated HgA1c and was informed this puts her at greater risk of developing diabetes. She continues to work on diet and exercise to decrease her risk of diabetes. She denies nausea or hypoglycemia. 12/14/2019 blood glucose 89, A1c 5.7 with an insulin of 14.9. Mackenzie Watson is on Wegovy, which she is tolerating well.  Per titration schedule she will increase to [facility] 1.7mg  once weekly.  Lab Results  Component Value Date   HGBA1C 5.7 (H) 12/14/2019   Lab Results  Component Value Date   INSULIN 14.9 12/14/2019   INSULIN 11.2 07/13/2019   INSULIN 17.4 01/26/2019   INSULIN 16.2 10/07/2018   Essential hypertension. Blood pressure is above goal at today's office visit. [name] had not taken chlorthalidone this morning. She denies cardiac symptoms.  BP Readings from  Last 3 Encounters:  04/10/20 (!) 150/86  03/15/20 131/90  02/17/20 134/90   Lab Results  Component Value Date   CREATININE 0.87 12/14/2019   CREATININE 0.91 07/13/2019   CREATININE 0.97 01/26/2019   At risk for heart disease. [name] is at a higher than average risk for cardiovascular disease due to hypertension, prediabetes, and obesity.   Assessment/Plan:   Vitamin D deficiency. Low Vitamin D level contributes to fatigue and are associated with obesity, breast, and colon cancer. She was given a refill on her Vitamin D, Ergocalciferol, (DRISDOL) 1.25 MG (50000 UNIT) CAPS capsule every week #4 with 0 refills and will follow-up for routine testing of Vitamin D at her next office visit.   Prediabetes. [name] will continue to work on weight loss, exercise, and decreasing simple carbohydrates to help decrease the risk of diabetes. Labs will be checked at her next office visit.  Essential hypertension. Mackenzie Watson is working on healthy weight loss and exercise to improve blood pressure control. We will watch for signs of hypotension as she continues her lifestyle modifications. Labs will be checked at her next office visit. [name] will continue diuretics as directed and increase her water intake.  At risk for heart disease. Mackenzie Watson was given approximately 15 minutes of coronary artery disease prevention counseling today. She is 49 y.o. female and has risk factors for heart disease including obesity. We discussed intensive lifestyle modifications today with an emphasis on specific weight loss instructions and strategies.   Repetitive spaced learning was employed today to elicit superior memory formation and behavioral change.  Class 2  severe obesity with serious comorbidity and body mass index (BMI) of 36.0 to 36.9 in adult, unspecified obesity type (HCC). Refill was given for Semaglutide-Weight Management (WEGOVY) 1 MG/0.5ML SOAJ 1.7 mg once a week, dispense 3 mL with 0 refills.  [name] is  currently in the action stage of change. As such, her goal is to continue with weight loss efforts. She has agreed to the [employer] with additional protein options.   Exercise goals: Mackenzie Watson will increase her regular walking.  Behavioral modification strategies: increasing lean protein intake, increasing water intake, meal planning and cooking strategies and planning for success.  Mackenzie Watson has agreed to follow-up with our clinic fasting in 3 weeks. She was informed of the importance of frequent follow-up visits to maximize her success with intensive lifestyle modifications for her multiple health conditions.   Objective:   Blood pressure (!) 150/86, pulse 100, temperature 98 F (36.7 C), height 5\' 6"  (1.676 m), weight 226 lb (102.5 kg), SpO2 97 %. Body mass index is 36.48 kg/m.  General: Cooperative, alert, well developed, in no acute distress. HEENT: Conjunctivae and lids unremarkable. Cardiovascular: Regular rhythm.  Lungs: Normal work of breathing. Neurologic: No focal deficits.   Lab Results  Component Value Date   CREATININE 0.87 12/14/2019   BUN 13 12/14/2019   NA 140 12/14/2019   K 3.8 12/14/2019   CL 102 12/14/2019   CO2 23 12/14/2019   Lab Results  Component Value Date   ALT 16 12/14/2019   AST 15 12/14/2019   ALKPHOS 76 12/14/2019   BILITOT 0.6 12/14/2019   Lab Results  Component Value Date   HGBA1C 5.7 (H) 12/14/2019   HGBA1C 5.7 (H) 07/13/2019   HGBA1C 5.7 (H) 01/26/2019   HGBA1C 5.8 (H) 10/07/2018   Lab Results  Component Value Date   INSULIN 14.9 12/14/2019   INSULIN 11.2 07/13/2019   INSULIN 17.4 01/26/2019   INSULIN 16.2 10/07/2018   Lab Results  Component Value Date   TSH 1.310 10/07/2018   Lab Results  Component Value Date   CHOL 158 12/14/2019   HDL 48 12/14/2019   LDLCALC 94 12/14/2019   TRIG 82 12/14/2019   Lab Results  Component Value Date   WBC 5.9 10/07/2018   HGB 13.5 10/07/2018   HCT 42.9 10/07/2018   MCV 81 10/07/2018    No results found for: IRON, TIBC, FERRITIN  Attestation Statements:   Reviewed by clinician on day of visit: allergies, medications, problem list, medical history, surgical history, family history, social history, and previous encounter notes.  I, 12/07/2018, am acting as [employer] for Energy manager, NP-C   I have reviewed the above documentation for accuracy and completeness, and I agree with the above. -  Shawnie Nicole d. Georganna Maxson, NP-C

## 2020-04-10 NOTE — Telephone Encounter (Signed)
Patient states that [facility] no longer fills [employer].  [facility] pharmacy also told her that the directions were either incorrect or unclear.  Patient is requesting that our office find another pharmacy that will fill this medication and adjust directions so that it can be filled.  She is out of this medication.  Thank you

## 2020-04-11 ENCOUNTER — Encounter (INDEPENDENT_AMBULATORY_CARE_PROVIDER_SITE_OTHER): Payer: Self-pay

## 2020-04-11 ENCOUNTER — Telehealth (INDEPENDENT_AMBULATORY_CARE_PROVIDER_SITE_OTHER): Payer: Self-pay | Admitting: Adult Health

## 2020-04-11 NOTE — Telephone Encounter (Signed)
Unable to reach pt by phone. MyChart message sent to pt with next step instructions.

## 2020-04-11 NOTE — Telephone Encounter (Signed)
Patient was told in the appt 04/10/20 that her medicine was going to be increased and the prescription that was called in is at the same mg as before.  She needs to get this corrected so she will have her next dose ready for Sunday.  She would like to get a call back concerning this issue.  The medication, Wegovy 1 was supposed to go up to 1.7 per her and Katy's conversation in the office visit.

## 2020-04-11 NOTE — Telephone Encounter (Signed)
Last seen by Katy 

## 2020-04-12 NOTE — Telephone Encounter (Signed)
Last OV with Katy 

## 2020-04-12 NOTE — Telephone Encounter (Signed)
Mackenzie Watson is not here could you please advise. Pt states wrong dose of Wegovy called in to [facility].

## 2020-04-12 NOTE — Telephone Encounter (Signed)
Please advise 

## 2020-04-13 ENCOUNTER — Telehealth (INDEPENDENT_AMBULATORY_CARE_PROVIDER_SITE_OTHER): Payer: Self-pay

## 2020-04-13 ENCOUNTER — Other Ambulatory Visit (INDEPENDENT_AMBULATORY_CARE_PROVIDER_SITE_OTHER): Payer: Self-pay

## 2020-04-13 DIAGNOSIS — Z6836 Body mass index (BMI) 36.0-36.9, adult: Secondary | ICD-10-CM

## 2020-04-13 MED ORDER — WEGOVY 1 MG/0.5ML ~~LOC~~ SOAJ: 2.0000 mg | SUBCUTANEOUS | 0 refills | Status: DC

## 2020-04-13 NOTE — Telephone Encounter (Signed)
Called pharmacy and clarified that per last office visit note, pt should be taking Wegovy 1.78mg  SQ once weekly.

## 2020-04-13 NOTE — Telephone Encounter (Signed)
Pharmacy is asking about the prescription for wagovie (spelling)? Needs to know how much the patient needs to take.

## 2020-04-13 NOTE — Telephone Encounter (Signed)
This patient states she has been messaging since Monday to get her refill.  She is going out of town tomorrow and needs her meds.

## 2020-04-13 NOTE — Telephone Encounter (Signed)
See prior documentation between patient and C.McMullen,LPN, in which patient refused to contact pharmacies and determine which one had her medication in stock because pt insisted that this was the responsibility of her provider's office to find out what pharmacy carrier her medications. Late yesterday afternoon, pt provided this office with a pharmacy name, and refill request has since been provided to MD for approval/denial.

## 2020-04-17 ENCOUNTER — Encounter (INDEPENDENT_AMBULATORY_CARE_PROVIDER_SITE_OTHER): Payer: Self-pay

## 2020-04-17 NOTE — Telephone Encounter (Signed)
Please advise, I am showing the correct dose was sent 04/13/20.

## 2020-05-01 ENCOUNTER — Other Ambulatory Visit (INDEPENDENT_AMBULATORY_CARE_PROVIDER_SITE_OTHER): Payer: Self-pay | Admitting: Adult Health

## 2020-05-01 DIAGNOSIS — Z6836 Body mass index (BMI) 36.0-36.9, adult: Secondary | ICD-10-CM

## 2020-05-03 ENCOUNTER — Other Ambulatory Visit (INDEPENDENT_AMBULATORY_CARE_PROVIDER_SITE_OTHER): Payer: Self-pay

## 2020-05-03 ENCOUNTER — Encounter (INDEPENDENT_AMBULATORY_CARE_PROVIDER_SITE_OTHER): Payer: Self-pay | Admitting: Family Medicine

## 2020-05-03 ENCOUNTER — Other Ambulatory Visit (INDEPENDENT_AMBULATORY_CARE_PROVIDER_SITE_OTHER): Payer: Self-pay | Admitting: Adult Health

## 2020-05-03 DIAGNOSIS — Z6836 Body mass index (BMI) 36.0-36.9, adult: Secondary | ICD-10-CM

## 2020-05-03 MED ORDER — WEGOVY 1 MG/0.5ML ~~LOC~~ SOAJ: 2.0000 mg | SUBCUTANEOUS | 1 refills | Status: DC

## 2020-05-08 ENCOUNTER — Ambulatory Visit (INDEPENDENT_AMBULATORY_CARE_PROVIDER_SITE_OTHER): Payer: BC Managed Care – PPO | Admitting: Adult Health

## 2020-05-15 ENCOUNTER — Ambulatory Visit (INDEPENDENT_AMBULATORY_CARE_PROVIDER_SITE_OTHER): Payer: BC Managed Care – PPO | Admitting: Family Medicine

## 2020-05-15 ENCOUNTER — Encounter (INDEPENDENT_AMBULATORY_CARE_PROVIDER_SITE_OTHER): Payer: Self-pay | Admitting: Family Medicine

## 2020-05-15 ENCOUNTER — Other Ambulatory Visit: Payer: Self-pay

## 2020-05-15 VITALS — BP 133/87 | HR 78 | Temp 98.0°F | Ht 66.0 in | Wt 224.0 lb

## 2020-05-15 DIAGNOSIS — R7303 Prediabetes: Secondary | ICD-10-CM

## 2020-05-15 DIAGNOSIS — Z6836 Body mass index (BMI) 36.0-36.9, adult: Secondary | ICD-10-CM

## 2020-05-15 DIAGNOSIS — Z9189 Other specified personal risk factors, not elsewhere classified: Secondary | ICD-10-CM | POA: Diagnosis not present

## 2020-05-15 DIAGNOSIS — E559 Vitamin D deficiency, unspecified: Secondary | ICD-10-CM | POA: Diagnosis not present

## 2020-05-15 MED ORDER — VITAMIN D (ERGOCALCIFEROL) 1.25 MG (50000 UNIT) PO CAPS: 50000.0000 [IU] | ORAL_CAPSULE | ORAL | 0 refills | Status: DC

## 2020-05-15 MED ORDER — WEGOVY 2.4 MG/0.75ML ~~LOC~~ SOAJ: 2.4000 mg | SUBCUTANEOUS | 0 refills | Status: DC

## 2020-05-16 LAB — COMPREHENSIVE METABOLIC PANEL
ALT: 12 IU/L (ref 0–32)
AST: 11 IU/L (ref 0–40)
Albumin/Globulin Ratio: 1.4 (ref 1.2–2.2)
Albumin: 4.3 g/dL (ref 3.8–4.8)
Alkaline Phosphatase: 73 IU/L (ref 44–121)
BUN/Creatinine Ratio: 14 (ref 9–23)
BUN: 13 mg/dL (ref 6–24)
Bilirubin Total: 0.4 mg/dL (ref 0.0–1.2)
CO2: 26 mmol/L (ref 20–29)
Calcium: 9.8 mg/dL (ref 8.7–10.2)
Chloride: 101 mmol/L (ref 96–106)
Creatinine, Ser: 0.91 mg/dL (ref 0.57–1.00)
GFR calc Af Amer: 86 mL/min/{1.73_m2} (ref >59)
GFR calc non Af Amer: 74 mL/min/{1.73_m2} (ref >59)
Globulin, Total: 3.1 g/dL (ref 1.5–4.5)
Glucose: 78 mg/dL (ref 65–99)
Potassium: 3.6 mmol/L (ref 3.5–5.2)
Sodium: 139 mmol/L (ref 134–144)
Total Protein: 7.4 g/dL (ref 6.0–8.5)

## 2020-05-16 LAB — VITAMIN D 25 HYDROXY (VIT D DEFICIENCY, FRACTURES): Vit D, 25-Hydroxy: 47.6 ng/mL (ref 30.0–100.0)

## 2020-05-16 LAB — HEMOGLOBIN A1C
Est. average glucose Bld gHb Est-mCnc: 117 mg/dL
Hgb A1c MFr Bld: 5.7 % — ABNORMAL HIGH (ref 4.8–5.6)

## 2020-05-16 LAB — INSULIN, RANDOM: INSULIN: 12.4 u[IU]/mL (ref 2.6–24.9)

## 2020-05-17 NOTE — Progress Notes (Signed)
Chief Complaint:   OBESITY Mackenzie Watson is here to discuss her progress with her obesity treatment plan along with follow-up of her obesity related diagnoses. Mackenzie Watson is on the [employer] and states she is following her eating plan approximately 75-80% of the time. Mackenzie Watson states she is walking 30-45 minutes 2 times per week.  Today's visit was #: 25 Starting weight: 249 lbs Starting date: 10/07/2018 Today's weight: 224 lbs Today's date: 05/15/2020 Total lbs lost to date: 25 lbs Total lbs lost since last in-office visit: 2 lbs  Interim History: Mackenzie Watson had a good restful holiday and was not ready to go back to work. She is still doing Pescatarian plan and trying to get a good amount of water in. Mackenzie Watson is doing well getting all food in with [facility]. She is on Wegovy 1.7 mg dose.  Subjective:   1. Vitamin D deficiency Mackenzie Watson is on prescription Vitamin D. Last Vitamin D level was 54.0. She denies nausea, vomiting, or muscle weakness and has fatigue.  2. Prediabetes Mackenzie Watson has a diagnosis of prediabetes based on her elevated HgA1c and was informed this puts her at greater risk of developing diabetes. She continues to work on diet and exercise to decrease her risk of diabetes. She denies nausea or hypoglycemia. Last A1c was 5.7. Insulin 14.9 (August 2021). She is on GLP-1. No GI side effect. Lab Results  Component Value Date   HGBA1C 5.7 (H) 05/15/2020   Lab Results  Component Value Date   INSULIN 12.4 05/15/2020   INSULIN 14.9 12/14/2019   INSULIN 11.2 07/13/2019   INSULIN 17.4 01/26/2019   INSULIN 16.2 10/07/2018    3. At risk for diabetes mellitus Mackenzie Watson is at higher than average risk for developing diabetes due to obesity.    Assessment/Plan:   1. Vitamin D deficiency We will refill Vitamin D 50k IU/wk #4. No refill.  - Vitamin D, Ergocalciferol, (DRISDOL) 1.25 MG (50000 UNIT) CAPS capsule; Take 1 capsule (50,000 Units total) by mouth every 14 (fourteen) days.   Dispense: 4 capsule; Refill: 0 - VITAMIN D 25 Hydroxy (Vit-D Deficiency, Fractures)  2. Prediabetes Labs done in office today. - Hemoglobin A1c - Insulin, random - Comprehensive metabolic panel  3. At risk for diabetes mellitus Mackenzie Watson was given approximately 15 minutes of diabetes education and counseling today. We discussed intensive lifestyle modifications today with an emphasis on weight loss as well as increasing exercise and decreasing simple carbohydrates in her diet. We also reviewed medication options with an emphasis on risk versus benefit of those discussed.   Repetitive spaced learning was employed today to elicit superior memory formation and behavioral change.  4. Class 2 severe obesity with serious comorbidity and body mass index (BMI) of 36.0 to 36.9 in adult, unspecified obesity type Mackenzie Watson)  Mackenzie Watson is currently in the action stage of change. As such, her goal is to continue with weight loss efforts. She has agreed to the [employer].   - Semaglutide-Weight Management (WEGOVY) 2.4 MG/0.75ML SOAJ; Inject 2.4 mg into the skin once a week.  Dispense: 3 mL; Refill: 0   Exercise goals: Mackenzie Watson is ready to start adding more activity.  Behavioral modification strategies: increasing lean protein intake, meal planning and cooking strategies and keeping healthy foods in the home.  Mackenzie Watson has agreed to follow-up with our clinic in 3 weeks. She was informed of the importance of frequent follow-up visits to maximize her success with intensive lifestyle modifications for her multiple health conditions.   Mackenzie Watson  was informed we would discuss her lab results at her next visit unless there is a critical issue that needs to be addressed sooner. Mackenzie Watson agreed to keep her next visit at the agreed upon time to discuss these results.  Objective:   Blood pressure 133/87, pulse 78, temperature 98 F (36.7 C), temperature source Oral, height 5\' 6"  (1.676 m), weight 224 lb (101.6 kg),  SpO2 97 %. Body mass index is 36.15 kg/m.  General: Cooperative, alert, well developed, in no acute distress. HEENT: Conjunctivae and lids unremarkable. Cardiovascular: Regular rhythm.  Lungs: Normal work of breathing. Neurologic: No focal deficits.   Lab Results  Component Value Date   CREATININE 0.91 05/15/2020   BUN 13 05/15/2020   NA 139 05/15/2020   K 3.6 05/15/2020   CL 101 05/15/2020   CO2 26 05/15/2020   Lab Results  Component Value Date   ALT 12 05/15/2020   AST 11 05/15/2020   ALKPHOS 73 05/15/2020   BILITOT 0.4 05/15/2020   Lab Results  Component Value Date   HGBA1C 5.7 (H) 05/15/2020   HGBA1C 5.7 (H) 12/14/2019   HGBA1C 5.7 (H) 07/13/2019   HGBA1C 5.7 (H) 01/26/2019   HGBA1C 5.8 (H) 10/07/2018   Lab Results  Component Value Date   INSULIN 12.4 05/15/2020   INSULIN 14.9 12/14/2019   INSULIN 11.2 07/13/2019   INSULIN 17.4 01/26/2019   INSULIN 16.2 10/07/2018   Lab Results  Component Value Date   TSH 1.310 10/07/2018   Lab Results  Component Value Date   CHOL 158 12/14/2019   HDL 48 12/14/2019   LDLCALC 94 12/14/2019   TRIG 82 12/14/2019   Lab Results  Component Value Date   WBC 5.9 10/07/2018   HGB 13.5 10/07/2018   HCT 42.9 10/07/2018   MCV 81 10/07/2018   No results found for: IRON, TIBC, FERRITIN   Attestation Statements:   Reviewed by clinician on day of visit: allergies, medications, problem list, medical history, surgical history, family history, social history, and previous encounter notes.  I, 12/07/2018, am acting as Delorse Limber for Energy manager MD. I have reviewed the above documentation for accuracy and completeness, and I agree with the above. - Reuben Likes, MD

## 2020-06-13 ENCOUNTER — Other Ambulatory Visit: Payer: Self-pay

## 2020-06-13 ENCOUNTER — Ambulatory Visit (INDEPENDENT_AMBULATORY_CARE_PROVIDER_SITE_OTHER): Payer: BC Managed Care – PPO | Admitting: Family Medicine

## 2020-06-13 ENCOUNTER — Encounter (INDEPENDENT_AMBULATORY_CARE_PROVIDER_SITE_OTHER): Payer: Self-pay | Admitting: Family Medicine

## 2020-06-13 VITALS — BP 154/90 | HR 61 | Temp 97.5°F | Ht 66.0 in | Wt 223.0 lb

## 2020-06-13 DIAGNOSIS — R632 Polyphagia: Secondary | ICD-10-CM

## 2020-06-13 DIAGNOSIS — Z9189 Other specified personal risk factors, not elsewhere classified: Secondary | ICD-10-CM | POA: Diagnosis not present

## 2020-06-13 DIAGNOSIS — I10 Essential (primary) hypertension: Secondary | ICD-10-CM

## 2020-06-13 DIAGNOSIS — E559 Vitamin D deficiency, unspecified: Secondary | ICD-10-CM | POA: Diagnosis not present

## 2020-06-13 DIAGNOSIS — Z6836 Body mass index (BMI) 36.0-36.9, adult: Secondary | ICD-10-CM

## 2020-06-13 MED ORDER — VITAMIN D (ERGOCALCIFEROL) 1.25 MG (50000 UNIT) PO CAPS: 50000.0000 [IU] | ORAL_CAPSULE | ORAL | 0 refills | Status: DC

## 2020-06-13 MED ORDER — OZEMPIC (1 MG/DOSE) 2 MG/1.5ML ~~LOC~~ SOPN
1.0000 mg | PEN_INJECTOR | SUBCUTANEOUS | 0 refills | Status: DC
Start: 2020-06-13 — End: 2020-07-11

## 2020-06-13 MED ORDER — CHLORTHALIDONE 25 MG PO TABS: 12.5000 mg | ORAL_TABLET | Freq: Every day | ORAL | 0 refills | Status: DC

## 2020-06-14 ENCOUNTER — Encounter (INDEPENDENT_AMBULATORY_CARE_PROVIDER_SITE_OTHER): Payer: Self-pay

## 2020-06-15 NOTE — Progress Notes (Signed)
Chief Complaint:   OBESITY Mackenzie Watson is here to discuss her progress with her obesity treatment plan along with follow-up of her obesity related diagnoses. Mackenzie Watson is on the [employer] and states she is following her eating plan approximately 75-80% of the time. Mackenzie Watson states she is walking 30 minutes 3 times per week.  Today's visit was #: 26 Starting weight: 249 lbs Starting date: 10/07/2018 Today's weight: 223 lbs Today's date: 06/13/2020 Total lbs lost to date: 26 lbs Total lbs lost since last in-office visit: 1 lb  Interim History: The last few weeks have OK. Last week and a half pt was out of town as her aunt passed away. She realizes she likely overindulged on sweets. Pt is trying to recommit to pescatarian plan and realizes she can eat chicken if she wants. She has no real plans for the upcoming weeks.  Subjective:   1. Polyphagia Pt symptoms are better controlled with Ozempic. Discount card ran out and [name] is too expensive.  2. Essential hypertension BP elevated today but pt did not take medicine.  Pt denies chest pain, chest pressure and headache.  BP Readings from Last 3 Encounters:  06/13/20 (!) 154/90  05/15/20 133/87  04/10/20 (!) 150/86    3. Vitamin D deficiency Pt denies nausea, vomiting, and muscle weakness but notes fatigue. Pt is on prescription Vit D.  4. At risk for heart disease Mackenzie Watson is at a higher than average risk for cardiovascular disease due to obesity.   Assessment/Plan:   1. Polyphagia Start Ozempic 1 mg SubQ weekly, as per below.  - Semaglutide, 1 MG/DOSE, (OZEMPIC, 1 MG/DOSE,) 2 MG/1.5ML SOPN; Inject 1 mg into the skin once a week.  Dispense: 3 mL; Refill: 0  2. Essential hypertension Mackenzie Watson is working on healthy weight loss and exercise to improve blood pressure control. We will watch for signs of hypotension as she continues her lifestyle modifications.  - chlorthalidone (HYGROTON) 25 MG tablet; Take 0.5 tablets (12.5 mg  total) by mouth daily.  Dispense: 45 tablet; Refill: 0  3. Vitamin D deficiency Low Vitamin D level contributes to fatigue and are associated with obesity, breast, and colon cancer. She agrees to continue to take prescription Vitamin D @50 ,000 IU every week and will follow-up for routine testing of Vitamin D, at least 2-3 times per year to avoid over-replacement.  - Vitamin D, Ergocalciferol, (DRISDOL) 1.25 MG (50000 UNIT) CAPS capsule; Take 1 capsule (50,000 Units total) by mouth every 14 (fourteen) days.  Dispense: 4 capsule; Refill: 0  4. At risk for heart disease Mackenzie Watson was given approximately 15 minutes of coronary artery disease prevention counseling today. She is 50 y.o. female and has risk factors for heart disease including obesity. We discussed intensive lifestyle modifications today with an emphasis on specific weight loss instructions and strategies.   Repetitive spaced learning was employed today to elicit superior memory formation and behavioral change.  5. Class 2 severe obesity with serious comorbidity and body mass index (BMI) of 36.0 to 36.9 in adult, unspecified obesity type Mackenzie Watson) Mackenzie Watson is currently in the action stage of change. As such, her goal is to continue with weight loss efforts. She has agreed to the [name].   Exercise goals: All adults should avoid inactivity. Some physical activity is better than none, and adults who participate in any amount of physical activity gain some health benefits.  Behavioral modification strategies: increasing lean protein intake, meal planning and cooking strategies, keeping healthy foods in the  home and planning for success.  [name] has agreed to follow-up with our clinic in 3 weeks. She was informed of the importance of frequent follow-up visits to maximize her success with intensive lifestyle modifications for her multiple health conditions.   Objective:   Blood pressure (!) 154/90, pulse 61, temperature (!) 97.5 F  (36.4 C), temperature source Oral, height 5\' 6"  (1.676 m), weight 223 lb (101.2 kg), SpO2 100 %. Body mass index is 35.99 kg/m.  General: Cooperative, alert, well developed, in no acute distress. HEENT: Conjunctivae and lids unremarkable. Cardiovascular: Regular rhythm.  Lungs: Normal work of breathing. Neurologic: No focal deficits.   Lab Results  Component Value Date   CREATININE 0.91 05/15/2020   BUN 13 05/15/2020   NA 139 05/15/2020   K 3.6 05/15/2020   CL 101 05/15/2020   CO2 26 05/15/2020   Lab Results  Component Value Date   ALT 12 05/15/2020   AST 11 05/15/2020   ALKPHOS 73 05/15/2020   BILITOT 0.4 05/15/2020   Lab Results  Component Value Date   HGBA1C 5.7 (H) 05/15/2020   HGBA1C 5.7 (H) 12/14/2019   HGBA1C 5.7 (H) 07/13/2019   HGBA1C 5.7 (H) 01/26/2019   HGBA1C 5.8 (H) 10/07/2018   Lab Results  Component Value Date   INSULIN 12.4 05/15/2020   INSULIN 14.9 12/14/2019   INSULIN 11.2 07/13/2019   INSULIN 17.4 01/26/2019   INSULIN 16.2 10/07/2018   Lab Results  Component Value Date   TSH 1.310 10/07/2018   Lab Results  Component Value Date   CHOL 158 12/14/2019   HDL 48 12/14/2019   LDLCALC 94 12/14/2019   TRIG 82 12/14/2019   Lab Results  Component Value Date   WBC 5.9 10/07/2018   HGB 13.5 10/07/2018   HCT 42.9 10/07/2018   MCV 81 10/07/2018    Attestation Statements:   Reviewed by clinician on day of visit: allergies, medications, problem list, medical history, surgical history, family history, social history, and previous encounter notes.  12/07/2018, am acting as transcriptionist for Edmund Hilda, MD.   I have reviewed the above documentation for accuracy and completeness, and I agree with the above. - Reuben Likes, MD

## 2020-07-11 ENCOUNTER — Other Ambulatory Visit: Payer: Self-pay

## 2020-07-11 ENCOUNTER — Encounter (INDEPENDENT_AMBULATORY_CARE_PROVIDER_SITE_OTHER): Payer: Self-pay | Admitting: Family Medicine

## 2020-07-11 ENCOUNTER — Ambulatory Visit (INDEPENDENT_AMBULATORY_CARE_PROVIDER_SITE_OTHER): Payer: BC Managed Care – PPO | Admitting: Family Medicine

## 2020-07-11 VITALS — BP 141/84 | HR 77 | Temp 98.2°F | Ht 66.0 in | Wt 225.0 lb

## 2020-07-11 DIAGNOSIS — I10 Essential (primary) hypertension: Secondary | ICD-10-CM | POA: Diagnosis not present

## 2020-07-11 DIAGNOSIS — Z9189 Other specified personal risk factors, not elsewhere classified: Secondary | ICD-10-CM

## 2020-07-11 DIAGNOSIS — Z6836 Body mass index (BMI) 36.0-36.9, adult: Secondary | ICD-10-CM

## 2020-07-11 DIAGNOSIS — R632 Polyphagia: Secondary | ICD-10-CM

## 2020-07-11 MED ORDER — OZEMPIC (1 MG/DOSE) 2 MG/1.5ML ~~LOC~~ SOPN: 1.0000 mg | PEN_INJECTOR | SUBCUTANEOUS | 0 refills | Status: DC

## 2020-07-12 ENCOUNTER — Other Ambulatory Visit (INDEPENDENT_AMBULATORY_CARE_PROVIDER_SITE_OTHER): Payer: Self-pay

## 2020-07-12 DIAGNOSIS — R632 Polyphagia: Secondary | ICD-10-CM

## 2020-07-12 MED ORDER — OZEMPIC (1 MG/DOSE) 2 MG/1.5ML ~~LOC~~ SOPN: 1.0000 mg | PEN_INJECTOR | SUBCUTANEOUS | 0 refills | Status: DC

## 2020-07-13 NOTE — Progress Notes (Signed)
Chief Complaint:   OBESITY Mackenzie Watson is here to discuss her progress with her obesity treatment plan along with follow-up of her obesity related diagnoses. Mackenzie Watson is on the [employer] and states she is following her eating plan approximately 75% of the time. Mackenzie Watson states she is walking 30 minutes 2 times per week.  Today's visit was #: 27 Starting weight: 249 lbs Starting date: 10/07/2018 Today's weight: 225 lbs Today's date: 07/11/2020 Total lbs lost to date: 24 lbs Total lbs lost since last in-office visit: 0  Interim History: Lorrain has been snacking more in the evenings after long days. She is not able to do as much prep as she did before. She went to the grocery store yesterday and got all food to be able to meal prep this week. She has spring break in April coming up that she is going to plan something for. She is being mindful of additional calories if she needs an indulgence.  Subjective:   1. Polyphagia Mackenzie Watson's last A1c was 5.7 and insulin level 12.4. She is on Ozempic with reasonable control of carb indulgences but increase in snacking with stress.  2. Essential hypertension Mackenzie Watson's BP is slightly elevated today.  Pt denies chest pain, chest pressure and headache. She is on Hygroton 12.5 mg.  BP Readings from Last 3 Encounters:  07/11/20 (!) 141/84  06/13/20 (!) 154/90  05/15/20 133/87    3. At risk for side effect of medication Mackenzie Watson is at risk for side effects of medication due to increased dose of Ozempic.  Assessment/Plan:   1. Polyphagia Intensive lifestyle modifications are the first line treatment for this issue. We discussed several lifestyle modifications today and she will continue to work on diet, exercise and weight loss efforts. Orders and follow up as documented in patient record.  Counseling . Polyphagia is excessive hunger. . Causes can include: low blood sugars, hypERthyroidism, PMS, lack of sleep, stress, insulin resistance,  diabetes, certain medications, and diets that are deficient in protein and fiber.   2. Essential hypertension Mackenzie Watson is working on healthy weight loss and exercise to improve blood pressure control. We will watch for signs of hypotension as she continues her lifestyle modifications. Continue Hygroton with no change in dose.  3. At risk for side effect of medication Mackenzie Watson was given approximately 15 minutes of drug side effect counseling today.  We discussed side effect possibility and risk versus benefits. Mackenzie Watson agreed to the medication and will contact this office if these side effects are intolerable.  Repetitive spaced learning was employed today to elicit superior memory formation and behavioral change.  4. Class 2 severe obesity with serious comorbidity and body mass index (BMI) of 36.0 to 36.9 in adult, unspecified obesity type [facility]) Mackenzie Watson is currently in the action stage of change. As such, her goal is to continue with weight loss efforts. She has agreed to the [employer].   Exercise goals: All adults should avoid inactivity. Some physical activity is better than none, and adults who participate in any amount of physical activity gain some health benefits.  Behavioral modification strategies: increasing lean protein intake, meal planning and cooking strategies, keeping healthy foods in the home and planning for success.  Mackenzie Watson has agreed to follow-up with our clinic in 3 weeks. She was informed of the importance of frequent follow-up visits to maximize her success with intensive lifestyle modifications for her multiple health conditions.   Objective:   Blood pressure (!) 141/84, pulse 77, temperature 98.2  F (36.8 C), temperature source Oral, height 5\' 6"  (1.676 m), weight 225 lb (102.1 kg), SpO2 98 %. Body mass index is 36.32 kg/m.  General: Cooperative, alert, well developed, in no acute distress. HEENT: Conjunctivae and lids unremarkable. Cardiovascular: Regular  rhythm.  Lungs: Normal work of breathing. Neurologic: No focal deficits.   Lab Results  Component Value Date   CREATININE 0.91 05/15/2020   BUN 13 05/15/2020   NA 139 05/15/2020   K 3.6 05/15/2020   CL 101 05/15/2020   CO2 26 05/15/2020   Lab Results  Component Value Date   ALT 12 05/15/2020   AST 11 05/15/2020   ALKPHOS 73 05/15/2020   BILITOT 0.4 05/15/2020   Lab Results  Component Value Date   HGBA1C 5.7 (H) 05/15/2020   HGBA1C 5.7 (H) 12/14/2019   HGBA1C 5.7 (H) 07/13/2019   HGBA1C 5.7 (H) 01/26/2019   HGBA1C 5.8 (H) 10/07/2018   Lab Results  Component Value Date   INSULIN 12.4 05/15/2020   INSULIN 14.9 12/14/2019   INSULIN 11.2 07/13/2019   INSULIN 17.4 01/26/2019   INSULIN 16.2 10/07/2018   Lab Results  Component Value Date   TSH 1.310 10/07/2018   Lab Results  Component Value Date   CHOL 158 12/14/2019   HDL 48 12/14/2019   LDLCALC 94 12/14/2019   TRIG 82 12/14/2019   Lab Results  Component Value Date   WBC 5.9 10/07/2018   HGB 13.5 10/07/2018   HCT 42.9 10/07/2018   MCV 81 10/07/2018    Attestation Statements:   Reviewed by clinician on day of visit: allergies, medications, problem list, medical history, surgical history, family history, social history, and previous encounter notes.  12/07/2018, am acting as transcriptionist for Edmund Hilda, MD.   I have reviewed the above documentation for accuracy and completeness, and I agree with the above. - Reuben Likes, MD

## 2020-08-07 ENCOUNTER — Other Ambulatory Visit (INDEPENDENT_AMBULATORY_CARE_PROVIDER_SITE_OTHER): Payer: Self-pay | Admitting: Family Medicine

## 2020-08-07 ENCOUNTER — Encounter (INDEPENDENT_AMBULATORY_CARE_PROVIDER_SITE_OTHER): Payer: Self-pay | Admitting: Family Medicine

## 2020-08-07 ENCOUNTER — Other Ambulatory Visit: Payer: Self-pay

## 2020-08-07 ENCOUNTER — Ambulatory Visit (INDEPENDENT_AMBULATORY_CARE_PROVIDER_SITE_OTHER): Payer: BC Managed Care – PPO | Admitting: Family Medicine

## 2020-08-07 VITALS — BP 125/84 | HR 79 | Temp 98.2°F | Ht 66.0 in | Wt 227.0 lb

## 2020-08-07 DIAGNOSIS — I10 Essential (primary) hypertension: Secondary | ICD-10-CM | POA: Diagnosis not present

## 2020-08-07 DIAGNOSIS — R632 Polyphagia: Secondary | ICD-10-CM

## 2020-08-07 DIAGNOSIS — Z9189 Other specified personal risk factors, not elsewhere classified: Secondary | ICD-10-CM | POA: Diagnosis not present

## 2020-08-07 DIAGNOSIS — Z6836 Body mass index (BMI) 36.0-36.9, adult: Secondary | ICD-10-CM

## 2020-08-07 MED ORDER — CHLORTHALIDONE 25 MG PO TABS: 12.5000 mg | ORAL_TABLET | Freq: Every day | ORAL | 0 refills | Status: DC

## 2020-08-07 MED ORDER — TOPIRAMATE 25 MG PO TABS
25.0000 mg | ORAL_TABLET | Freq: Every day | ORAL | 0 refills | Status: DC
Start: 2020-08-07 — End: 2020-09-28

## 2020-08-07 MED ORDER — OZEMPIC (1 MG/DOSE) 2 MG/1.5ML ~~LOC~~ SOPN: 1.0000 mg | PEN_INJECTOR | SUBCUTANEOUS | 0 refills | Status: DC

## 2020-08-07 NOTE — Telephone Encounter (Signed)
Please clarify

## 2020-08-12 NOTE — Progress Notes (Signed)
Chief Complaint:   OBESITY Mackenzie Watson is here to discuss her progress with her obesity treatment plan along with follow-up of her obesity related diagnoses. Mackenzie Watson is on the [employer] + chicken and states she is following her eating plan approximately 75% of the time. Mackenzie Watson states she is not currently exercising.  Today's visit was #: 28 Starting weight: 249 lbs Starting date: 10/07/2018 Today's weight: 227 lbs Today's date: 08/07/2020 Total lbs lost to date: 22 lbs Total lbs lost since last in-office visit: 0  Interim History: Mackenzie Watson's schedule has changed over the last few weeks. She is working in the afternoon so didn't have time to implement physical activity. Has also had a death in the work place. She's been eating later in the day. She has noticed an increase in hunger during the evening hours- doesn't feel satisfied after dinner (cravings sweet and salty).  Subjective:   1. Essential hypertension Mackenzie Watson's BP is well controlled today. Pt denies chest pain, chest pressure and headache.  BP Readings from Last 3 Encounters:  08/07/20 125/84  07/11/20 (!) 141/84  06/13/20 (!) 154/90    2. Polyphagia Mackenzie Watson is still experiencing increased drive to eat in the evenings. She is doing Ozempic twice a week.  3. At risk for side effect of medication Mackenzie Watson is at risk for side effects of medication due to starting topiramate 25 mg.  Assessment/Plan:   1. Essential hypertension Kerrianne is working on healthy weight loss and exercise to improve blood pressure control. We will watch for signs of hypotension as she continues her lifestyle modifications.  - chlorthalidone (HYGROTON) 25 MG tablet; Take 0.5 tablets (12.5 mg total) by mouth daily.  Dispense: 45 tablet; Refill: 0  2. Polyphagia Intensive lifestyle modifications are the first line treatment for this issue. We discussed several lifestyle modifications today and she will continue to work on diet, exercise and  weight loss efforts. Orders and follow up as documented in patient record. Refill Ozempic 1 mg SubQ weekly #6 ml no refills.  Counseling . Polyphagia is excessive hunger. . Causes can include: low blood sugars, hypERthyroidism, PMS, lack of sleep, stress, insulin resistance, diabetes, certain medications, and diets that are deficient in protein and fiber.   3. At risk for side effect of medication Mackenzie Watson was given approximately 15 minutes of drug side effect counseling today.  We discussed side effect possibility and risk versus benefits. Mackenzie Watson agreed to the medication and will contact this office if these side effects are intolerable.  Repetitive spaced learning was employed today to elicit superior memory formation and behavioral change.  4. Class 2 severe obesity with serious comorbidity and body mass index (BMI) of 36.0 to 36.9 in adult, unspecified obesity type Mackenzie Watson) Mackenzie Watson is currently in the action stage of change. As such, her goal is to continue with weight loss efforts. She has agreed to the [employer] + 100 calories.   We discussed various medication options to help Mackenzie Watson with her weight loss efforts and we both agreed to start topiramate 25 mg, as per below. - topiramate (TOPAMAX) 25 MG tablet; Take 1 tablet (25 mg total) by mouth at bedtime.  Dispense: 30 tablet; Refill: 0  Exercise goals: All adults should avoid inactivity. Some physical activity is better than none, and adults who participate in any amount of physical activity gain some health benefits. Pt is to start weighted hula hoop 10 minutes 2 times a week.  Behavioral modification strategies: increasing lean protein intake, meal planning  and cooking strategies, keeping healthy foods in the home and planning for success.  Mackenzie Watson has agreed to follow-up with our clinic in 3 weeks. She was informed of the importance of frequent follow-up visits to maximize her success with intensive lifestyle modifications for her  multiple health conditions.   Objective:   Blood pressure 125/84, pulse 79, temperature 98.2 F (36.8 C), height 5\' 6"  (1.676 m), weight 227 lb (103 kg), SpO2 97 %. Body mass index is 36.64 kg/m.  General: Cooperative, alert, well developed, in no acute distress. HEENT: Conjunctivae and lids unremarkable. Cardiovascular: Regular rhythm.  Lungs: Normal work of breathing. Neurologic: No focal deficits.   Lab Results  Component Value Date   CREATININE 0.91 05/15/2020   BUN 13 05/15/2020   NA 139 05/15/2020   K 3.6 05/15/2020   CL 101 05/15/2020   CO2 26 05/15/2020   Lab Results  Component Value Date   ALT 12 05/15/2020   AST 11 05/15/2020   ALKPHOS 73 05/15/2020   BILITOT 0.4 05/15/2020   Lab Results  Component Value Date   HGBA1C 5.7 (H) 05/15/2020   HGBA1C 5.7 (H) 12/14/2019   HGBA1C 5.7 (H) 07/13/2019   HGBA1C 5.7 (H) 01/26/2019   HGBA1C 5.8 (H) 10/07/2018   Lab Results  Component Value Date   INSULIN 12.4 05/15/2020   INSULIN 14.9 12/14/2019   INSULIN 11.2 07/13/2019   INSULIN 17.4 01/26/2019   INSULIN 16.2 10/07/2018   Lab Results  Component Value Date   TSH 1.310 10/07/2018   Lab Results  Component Value Date   CHOL 158 12/14/2019   HDL 48 12/14/2019   LDLCALC 94 12/14/2019   TRIG 82 12/14/2019   Lab Results  Component Value Date   WBC 5.9 10/07/2018   HGB 13.5 10/07/2018   HCT 42.9 10/07/2018   MCV 81 10/07/2018    Attestation Statements:   Reviewed by clinician on day of visit: allergies, medications, problem list, medical history, surgical history, family history, social history, and previous encounter notes.  12/07/2018, am acting as transcriptionist for Edmund Hilda, MD.   I have reviewed the above documentation for accuracy and completeness, and I agree with the above. - Reuben Likes, MD

## 2020-08-31 ENCOUNTER — Ambulatory Visit (INDEPENDENT_AMBULATORY_CARE_PROVIDER_SITE_OTHER): Payer: BC Managed Care – PPO | Admitting: Family Medicine

## 2020-09-05 ENCOUNTER — Ambulatory Visit (INDEPENDENT_AMBULATORY_CARE_PROVIDER_SITE_OTHER): Payer: BC Managed Care – PPO | Admitting: Family Medicine

## 2020-09-05 ENCOUNTER — Other Ambulatory Visit: Payer: Self-pay

## 2020-09-05 ENCOUNTER — Encounter (INDEPENDENT_AMBULATORY_CARE_PROVIDER_SITE_OTHER): Payer: Self-pay | Admitting: Family Medicine

## 2020-09-05 VITALS — BP 109/72 | HR 66 | Temp 98.5°F | Ht 66.0 in | Wt 230.0 lb

## 2020-09-05 DIAGNOSIS — E559 Vitamin D deficiency, unspecified: Secondary | ICD-10-CM

## 2020-09-05 DIAGNOSIS — R7303 Prediabetes: Secondary | ICD-10-CM

## 2020-09-05 DIAGNOSIS — Z6836 Body mass index (BMI) 36.0-36.9, adult: Secondary | ICD-10-CM | POA: Diagnosis not present

## 2020-09-05 DIAGNOSIS — E66812 Obesity, class 2: Secondary | ICD-10-CM

## 2020-09-06 NOTE — Progress Notes (Signed)
Chief Complaint:   OBESITY Mackenzie Watson is here to discuss her progress with her obesity treatment plan along with follow-up of her obesity related diagnoses. Rosalene is on the [employer] + 100 and states she is following her eating plan approximately 75-80% of the time. Mackenzie Watson states she is walking 15-20 minutes 2-3 times per week.  Today's visit was #: 29 Starting weight: 249 lbs Starting date: 10/07/2018 Today's weight: 230 lbs Today's date: 09/05/2020 Total lbs lost to date: 19 lbs Total lbs lost since last in-office visit: 0  Interim History: Pt voices spring break was very nice but too short. She has about 5.5 weeks. She is still following pescatarian plan and taking topiramate, which really just makes her sleepy. She didn't feel as hungry on Topamax but sometimes more thirsty. She realizes that she hasn't been drinking water as much as she needs to. Pt has been mindful of vegetable choices and has been leaning toward store bought salad.  Subjective:   1. Pre-diabetes Last A1c 5.7 and insulin level 12.4. Pt is on GLP-1 Ozempic twice a week.  2. Vitamin D deficiency Pt denies nausea, vomiting, and muscle weakness but notes fatigue. Pt is on prescription Vit D. Her last Vit D level was 47.6.  Assessment/Plan:   1. Pre-diabetes Mackenzie Watson will continue to work on weight loss, exercise, and decreasing simple carbohydrates to help decrease the risk of diabetes. Continue GLP-1.  2. Vitamin D deficiency Low Vitamin D level contributes to fatigue and are associated with obesity, breast, and colon cancer. She agrees to continue to take prescription Vitamin D @50 ,000 IU every week and will follow-up for routine testing of Vitamin D, at least 2-3 times per year to avoid over-replacement.  3. Class 2 severe obesity with serious comorbidity and body mass index (BMI) of 36.0 to 36.9 in adult, unspecified obesity type [facility])  Mackenzie Watson is currently in the action stage of change. As such, her  goal is to continue with weight loss efforts. She has agreed to the [name] + 100.   Exercise goals: As is  Behavioral modification strategies: increasing lean protein intake, meal planning and cooking strategies, keeping healthy foods in the home and planning for success.  Mackenzie Watson has agreed to follow-up with our clinic in 3 weeks. She was informed of the importance of frequent follow-up visits to maximize her success with intensive lifestyle modifications for her multiple health conditions.   Objective:   Blood pressure 109/72, pulse 66, temperature 98.5 F (36.9 C), height 5\' 6"  (1.676 m), weight 230 lb (104.3 kg), SpO2 98 %. Body mass index is 37.12 kg/m.  General: Cooperative, alert, well developed, in no acute distress. HEENT: Conjunctivae and lids unremarkable. Cardiovascular: Regular rhythm.  Lungs: Normal work of breathing. Neurologic: No focal deficits.   Lab Results  Component Value Date   CREATININE 0.91 05/15/2020   BUN 13 05/15/2020   NA 139 05/15/2020   K 3.6 05/15/2020   CL 101 05/15/2020   CO2 26 05/15/2020   Lab Results  Component Value Date   ALT 12 05/15/2020   AST 11 05/15/2020   ALKPHOS 73 05/15/2020   BILITOT 0.4 05/15/2020   Lab Results  Component Value Date   HGBA1C 5.7 (H) 05/15/2020   HGBA1C 5.7 (H) 12/14/2019   HGBA1C 5.7 (H) 07/13/2019   HGBA1C 5.7 (H) 01/26/2019   HGBA1C 5.8 (H) 10/07/2018   Lab Results  Component Value Date   INSULIN 12.4 05/15/2020   INSULIN 14.9 12/14/2019  INSULIN 11.2 07/13/2019   INSULIN 17.4 01/26/2019   INSULIN 16.2 10/07/2018   Lab Results  Component Value Date   TSH 1.310 10/07/2018   Lab Results  Component Value Date   CHOL 158 12/14/2019   HDL 48 12/14/2019   LDLCALC 94 12/14/2019   TRIG 82 12/14/2019   Lab Results  Component Value Date   WBC 5.9 10/07/2018   HGB 13.5 10/07/2018   HCT 42.9 10/07/2018   MCV 81 10/07/2018     Attestation Statements:   Reviewed by clinician on  day of visit: allergies, medications, problem list, medical history, surgical history, family history, social history, and previous encounter notes.  Time spent on visit including pre-visit chart review and post-visit care and charting was 15 minutes.   Edmund Hilda, CMA, am acting as transcriptionist for Reuben Likes, MD.   I have reviewed the above documentation for accuracy and completeness, and I agree with the above. - Katherina Mires, MD

## 2020-09-28 ENCOUNTER — Encounter (INDEPENDENT_AMBULATORY_CARE_PROVIDER_SITE_OTHER): Payer: Self-pay | Admitting: Family Medicine

## 2020-09-28 ENCOUNTER — Ambulatory Visit (INDEPENDENT_AMBULATORY_CARE_PROVIDER_SITE_OTHER): Payer: BC Managed Care – PPO | Admitting: Family Medicine

## 2020-09-28 ENCOUNTER — Other Ambulatory Visit: Payer: Self-pay

## 2020-09-28 VITALS — BP 134/77 | HR 76 | Temp 98.7°F | Ht 66.0 in | Wt 235.0 lb

## 2020-09-28 DIAGNOSIS — Z6836 Body mass index (BMI) 36.0-36.9, adult: Secondary | ICD-10-CM

## 2020-09-28 DIAGNOSIS — Z9189 Other specified personal risk factors, not elsewhere classified: Secondary | ICD-10-CM

## 2020-09-28 DIAGNOSIS — R632 Polyphagia: Secondary | ICD-10-CM | POA: Diagnosis not present

## 2020-09-28 DIAGNOSIS — E559 Vitamin D deficiency, unspecified: Secondary | ICD-10-CM

## 2020-09-28 MED ORDER — PHENTERMINE HCL 8 MG PO TABS: 4.0000 mg | ORAL_TABLET | Freq: Every morning | ORAL | 0 refills | Status: DC

## 2020-09-28 MED ORDER — TOPIRAMATE 25 MG PO TABS: 25.0000 mg | ORAL_TABLET | Freq: Every morning | ORAL | 0 refills | Status: DC

## 2020-09-28 MED ORDER — VITAMIN D (ERGOCALCIFEROL) 1.25 MG (50000 UNIT) PO CAPS: 50000.0000 [IU] | ORAL_CAPSULE | ORAL | 0 refills | Status: DC

## 2020-10-04 NOTE — Progress Notes (Signed)
Chief Complaint:   OBESITY Mackenzie Watson is here to discuss her progress with her obesity treatment plan along with follow-up of her obesity related diagnoses. Mackenzie Watson is on the [employer] and states she is following her eating plan approximately 75-80% of the time. [name] states she is walking through school.  Today's visit was #: 30 Starting weight: 249 lbs Starting date: 10/07/2018 Today's weight: 235 lbs Today's date: 09/28/2020 Total lbs lost to date: 14 lbs Total lbs lost since last in-office visit: 0  Interim History: The last few weeks has been stressful preparing for end of the year testing at work and some deaths in her family. [name] has done some emotional eating. She is interested in additional medication for weight loss. She is planning on staying home for [facility] Day weekend.  Subjective:   1. Polyphagia Mackenzie Watson is entering menopause, per her gyn. She is already on Topamax. PDMP checked. Controlled Substance Contract signed. Pt in menopause.  2. Vitamin D deficiency Mackenzie Watson denies nausea, vomiting, and muscle weakness but notes fatigue. Pt is on prescription Vit D. Her last Vit D level was 47.6.  3. At risk for side effect of medication Liam is at risk for side effects of medication due to starting Qsymia.  Assessment/Plan:   1. Polyphagia Intensive lifestyle modifications are the first line treatment for this issue. We discussed several lifestyle modifications today and she will continue to work on diet, exercise and weight loss efforts. Orders and follow up as documented in patient record. -Start Phentermine 4 mg, as prescribed below. -Change Topamax to every morning, as prescribed below.  Counseling Polyphagia is excessive hunger. Causes can include: low blood sugars, hypERthyroidism, PMS, lack of sleep, stress, insulin resistance, diabetes, certain medications, and diets that are deficient in protein and fiber.   - topiramate (TOPAMAX) 25 MG tablet;  Take 1 tablet (25 mg total) by mouth in the morning.  Dispense: 30 tablet; Refill: 0 - Phentermine HCl 8 MG TABS; Take 4 mg by mouth in the morning.  Dispense: 28 tablet; Refill: 0  2. Vitamin D deficiency Low Vitamin D level contributes to fatigue and are associated with obesity, breast, and colon cancer. She agrees to continue to take prescription Vitamin D @50 ,000 IU every week and will follow-up for routine testing of Vitamin D, at least 2-3 times per year to avoid over-replacement. - Vitamin D, Ergocalciferol, (DRISDOL) 1.25 MG (50000 UNIT) CAPS capsule; Take 1 capsule (50,000 Units total) by mouth every 14 (fourteen) days.  Dispense: 4 capsule; Refill: 0  3. At risk for side effect of medication Mackenzie Watson was given approximately 15 minutes of drug side effect counseling today.  We discussed side effect possibility and risk versus benefits. Mackenzie Watson agreed to the medication and will contact this office if these side effects are intolerable.  Repetitive spaced learning was employed today to elicit superior memory formation and behavioral change.  4. Class 2 severe obesity with serious comorbidity and body mass index (BMI) of 36.0 to 36.9 in adult, unspecified obesity type [facility]) [name] is currently in the action stage of change. As such, her goal is to continue with weight loss efforts. She has agreed to the [name] + 100 calories.   Exercise goals: All adults should avoid inactivity. Some physical activity is better than none, and adults who participate in any amount of physical activity gain some health benefits.  Behavioral modification strategies: increasing lean protein intake, meal planning and cooking strategies, keeping healthy foods in the home  and planning for success.  Mackenzie Watson has agreed to follow-up with our clinic in 3-4 weeks. She was informed of the importance of frequent follow-up visits to maximize her success with intensive lifestyle modifications for her multiple  health conditions.   Objective:   Blood pressure 134/77, pulse 76, temperature 98.7 F (37.1 C), height 5\' 6"  (1.676 m), weight 235 lb (106.6 kg), SpO2 97 %. Body mass index is 37.93 kg/m.  General: Cooperative, alert, well developed, in no acute distress. HEENT: Conjunctivae and lids unremarkable. Cardiovascular: Regular rhythm.  Lungs: Normal work of breathing. Neurologic: No focal deficits.   Lab Results  Component Value Date   CREATININE 0.91 05/15/2020   BUN 13 05/15/2020   NA 139 05/15/2020   K 3.6 05/15/2020   CL 101 05/15/2020   CO2 26 05/15/2020   Lab Results  Component Value Date   ALT 12 05/15/2020   AST 11 05/15/2020   ALKPHOS 73 05/15/2020   BILITOT 0.4 05/15/2020   Lab Results  Component Value Date   HGBA1C 5.7 (H) 05/15/2020   HGBA1C 5.7 (H) 12/14/2019   HGBA1C 5.7 (H) 07/13/2019   HGBA1C 5.7 (H) 01/26/2019   HGBA1C 5.8 (H) 10/07/2018   Lab Results  Component Value Date   INSULIN 12.4 05/15/2020   INSULIN 14.9 12/14/2019   INSULIN 11.2 07/13/2019   INSULIN 17.4 01/26/2019   INSULIN 16.2 10/07/2018   Lab Results  Component Value Date   TSH 1.310 10/07/2018   Lab Results  Component Value Date   CHOL 158 12/14/2019   HDL 48 12/14/2019   LDLCALC 94 12/14/2019   TRIG 82 12/14/2019   Lab Results  Component Value Date   WBC 5.9 10/07/2018   HGB 13.5 10/07/2018   HCT 42.9 10/07/2018   MCV 81 10/07/2018    Attestation Statements:   Reviewed by clinician on day of visit: allergies, medications, problem list, medical history, surgical history, family history, social history, and previous encounter notes.  12/07/2018, CMA, am acting as transcriptionist for Edmund Hilda, MD.   I have reviewed the above documentation for accuracy and completeness, and I agree with the above. - Reuben Likes, MD

## 2020-10-16 LAB — BASIC METABOLIC PANEL
BUN: 14 (ref 4–21)
CO2: 27 — AB (ref 13–22)
Chloride: 105 (ref 99–108)
Creatinine: 1 (ref 0.5–1.1)
Glucose: 83
Potassium: 3.5 (ref 3.4–5.3)
Sodium: 140 (ref 137–147)

## 2020-10-16 LAB — COMPREHENSIVE METABOLIC PANEL
Albumin: 4.2 (ref 3.5–5.0)
Calcium: 10.1 (ref 8.7–10.7)
GFR calc Af Amer: 70

## 2020-10-16 LAB — VITAMIN D 25 HYDROXY (VIT D DEFICIENCY, FRACTURES): Vit D, 25-Hydroxy: 34.5

## 2020-10-16 LAB — HEPATIC FUNCTION PANEL
ALT: 16 (ref 7–35)
Alkaline Phosphatase: 61 (ref 25–125)
Bilirubin, Total: 0.6

## 2020-10-16 LAB — HEMOGLOBIN A1C: Hemoglobin A1C: 5.9

## 2020-10-24 ENCOUNTER — Ambulatory Visit (INDEPENDENT_AMBULATORY_CARE_PROVIDER_SITE_OTHER): Payer: BC Managed Care – PPO | Admitting: Family Medicine

## 2020-10-24 ENCOUNTER — Encounter (INDEPENDENT_AMBULATORY_CARE_PROVIDER_SITE_OTHER): Payer: Self-pay | Admitting: Family Medicine

## 2020-10-24 ENCOUNTER — Other Ambulatory Visit: Payer: Self-pay

## 2020-10-24 VITALS — BP 123/84 | HR 88 | Temp 98.6°F | Ht 66.0 in | Wt 228.0 lb

## 2020-10-24 DIAGNOSIS — R632 Polyphagia: Secondary | ICD-10-CM

## 2020-10-24 DIAGNOSIS — Z9189 Other specified personal risk factors, not elsewhere classified: Secondary | ICD-10-CM

## 2020-10-24 DIAGNOSIS — E559 Vitamin D deficiency, unspecified: Secondary | ICD-10-CM

## 2020-10-24 DIAGNOSIS — Z6839 Body mass index (BMI) 39.0-39.9, adult: Secondary | ICD-10-CM | POA: Diagnosis not present

## 2020-10-24 MED ORDER — TOPIRAMATE 50 MG PO TABS: 50.0000 mg | ORAL_TABLET | Freq: Every day | ORAL | 0 refills | Status: DC

## 2020-10-24 MED ORDER — PHENTERMINE HCL 8 MG PO TABS: 8.0000 mg | ORAL_TABLET | Freq: Every morning | ORAL | 0 refills | Status: DC

## 2020-10-24 MED ORDER — VITAMIN D (ERGOCALCIFEROL) 1.25 MG (50000 UNIT) PO CAPS: 50000.0000 [IU] | ORAL_CAPSULE | ORAL | 0 refills | Status: DC

## 2020-10-25 NOTE — Progress Notes (Signed)
Chief Complaint:   OBESITY Mackenzie Watson is here to discuss her progress with her obesity treatment plan along with follow-up of her obesity related diagnoses. Mackenzie Watson is on the [employer] + 100 cal and states she is following her eating plan approximately 75% of the time. Mackenzie Watson states she is walking just a little extra.  Today's visit was #: 31 Starting weight: 249 lbs Starting date: 10/07/2018 Today's weight: 228 lbs Today's date: 10/24/2020 Total lbs lost to date: 21 Total lbs lost since last in-office visit: 7  Interim History: Mackenzie Watson is trying to get to a semblance of normalcy. She had colonoscopy last week and has been trying to get back on meal plan. She wants to start physical activity again now that her colonoscopy is done. No obstacles in the next few weeks.  Subjective:   1. Polyphagia Mackenzie Watson on Phentermine and topiramate combo. She denies side effects of medication.  2. Vitamin D deficiency Mackenzie Watson denies nausea, vomiting, and muscle weakness but notes fatigue. Pt is on prescription Vit D every 2 weeks.  3. At risk for side effect of medication Mackenzie Watson is at risk for side effects of medication due to increased dose of Qsymia.   Assessment/Plan:   1. Polyphagia Intensive lifestyle modifications are the first line treatment for this issue. We discussed several lifestyle modifications today and she will continue to work on diet, exercise and weight loss efforts. Orders and follow up as documented in patient record.  Counseling Polyphagia is excessive hunger. Causes can include: low blood sugars, hypERthyroidism, PMS, lack of sleep, stress, insulin resistance, diabetes, certain medications, and diets that are deficient in protein and fiber.   - topiramate (TOPAMAX) 50 MG tablet; Take 1 tablet (50 mg total) by mouth daily.  Dispense: 30 tablet; Refill: 0 - Phentermine HCl 8 MG TABS; Take 8 mg by mouth in the morning.  Dispense: 28 tablet; Refill: 0  2. Vitamin D  deficiency Low Vitamin D level contributes to fatigue and are associated with obesity, breast, and colon cancer. She agrees to continue to take prescription Vitamin D @50 ,000 IU every 2 weeks and will follow-up for routine testing of Vitamin D, at least 2-3 times per year to avoid over-replacement.  - Vitamin D, Ergocalciferol, (DRISDOL) 1.25 MG (50000 UNIT) CAPS capsule; Take 1 capsule (50,000 Units total) by mouth every 14 (fourteen) days.  Dispense: 4 capsule; Refill: 0  3. At risk for side effect of medication Mackenzie Watson was given approximately 15 minutes of drug side effect counseling today.  We discussed side effect possibility and risk versus benefits. Mackenzie Watson agreed to the medication and will contact this office if these side effects are intolerable.  Repetitive spaced learning was employed today to elicit superior memory formation and behavioral change.   4. Class 2 severe obesity with serious comorbidity and body mass index (BMI) of 39.0 to 39.9 in adult, unspecified obesity type Mackenzie Watson)  Mackenzie Watson is currently in the action stage of change. As such, her goal is to continue with weight loss efforts. She has agreed to the Mackenzie Watson + 100 calories.   Exercise goals: All adults should avoid inactivity. Some physical activity is better than none, and adults who participate in any amount of physical activity gain some Watson benefits.  Behavioral modification strategies: increasing lean protein intake, meal planning and cooking strategies, and keeping healthy foods in the home.  Mackenzie Watson has agreed to follow-up with our clinic in 3 weeks. She was informed of the importance of frequent  follow-up visits to maximize her success with intensive lifestyle modifications for her multiple Watson conditions.   Objective:   Blood pressure 123/84, pulse 88, temperature 98.6 F (37 C), height 5\' 6"  (1.676 m), weight 228 lb (103.4 kg), SpO2 96 %. Body mass index is 36.8 kg/m.  General: Cooperative,  alert, well developed, in no acute distress. HEENT: Conjunctivae and lids unremarkable. Cardiovascular: Regular rhythm.  Lungs: Normal work of breathing. Neurologic: No focal deficits.   Lab Results  Component Value Date   CREATININE 0.91 05/15/2020   BUN 13 05/15/2020   NA 139 05/15/2020   K 3.6 05/15/2020   CL 101 05/15/2020   CO2 26 05/15/2020   Lab Results  Component Value Date   ALT 12 05/15/2020   AST 11 05/15/2020   ALKPHOS 73 05/15/2020   BILITOT 0.4 05/15/2020   Lab Results  Component Value Date   HGBA1C 5.7 (H) 05/15/2020   HGBA1C 5.7 (H) 12/14/2019   HGBA1C 5.7 (H) 07/13/2019   HGBA1C 5.7 (H) 01/26/2019   HGBA1C 5.8 (H) 10/07/2018   Lab Results  Component Value Date   INSULIN 12.4 05/15/2020   INSULIN 14.9 12/14/2019   INSULIN 11.2 07/13/2019   INSULIN 17.4 01/26/2019   INSULIN 16.2 10/07/2018   Lab Results  Component Value Date   TSH 1.310 10/07/2018   Lab Results  Component Value Date   CHOL 158 12/14/2019   HDL 48 12/14/2019   LDLCALC 94 12/14/2019   TRIG 82 12/14/2019   Lab Results  Component Value Date   WBC 5.9 10/07/2018   HGB 13.5 10/07/2018   HCT 42.9 10/07/2018   MCV 81 10/07/2018   No results found for: IRON, TIBC, FERRITIN  Attestation Statements:   Reviewed by clinician on day of visit: allergies, medications, problem list, medical history, surgical history, family history, social history, and previous encounter notes.  12/07/2018, CMA, am acting as transcriptionist for Edmund Hilda, MD.   I have reviewed the above documentation for accuracy and completeness, and I agree with the above. - Reuben Likes, MD

## 2020-11-01 ENCOUNTER — Other Ambulatory Visit (INDEPENDENT_AMBULATORY_CARE_PROVIDER_SITE_OTHER): Payer: Self-pay | Admitting: Family Medicine

## 2020-11-01 DIAGNOSIS — R632 Polyphagia: Secondary | ICD-10-CM

## 2020-11-02 ENCOUNTER — Encounter (INDEPENDENT_AMBULATORY_CARE_PROVIDER_SITE_OTHER): Payer: Self-pay

## 2020-11-02 NOTE — Telephone Encounter (Signed)
Message sent to pt.

## 2020-11-14 ENCOUNTER — Encounter (INDEPENDENT_AMBULATORY_CARE_PROVIDER_SITE_OTHER): Payer: Self-pay | Admitting: Family Medicine

## 2020-11-14 ENCOUNTER — Other Ambulatory Visit: Payer: Self-pay

## 2020-11-14 ENCOUNTER — Ambulatory Visit (INDEPENDENT_AMBULATORY_CARE_PROVIDER_SITE_OTHER): Payer: BC Managed Care – PPO | Admitting: Family Medicine

## 2020-11-14 VITALS — BP 124/89 | HR 76 | Temp 98.4°F | Ht 66.0 in | Wt 230.0 lb

## 2020-11-14 DIAGNOSIS — Z6839 Body mass index (BMI) 39.0-39.9, adult: Secondary | ICD-10-CM

## 2020-11-14 DIAGNOSIS — R632 Polyphagia: Secondary | ICD-10-CM | POA: Diagnosis not present

## 2020-11-14 DIAGNOSIS — Z9189 Other specified personal risk factors, not elsewhere classified: Secondary | ICD-10-CM

## 2020-11-14 DIAGNOSIS — E559 Vitamin D deficiency, unspecified: Secondary | ICD-10-CM | POA: Diagnosis not present

## 2020-11-14 MED ORDER — TOPIRAMATE 25 MG PO TABS: 75.0000 mg | ORAL_TABLET | Freq: Every day | ORAL | 0 refills | Status: DC

## 2020-11-14 MED ORDER — VITAMIN D (ERGOCALCIFEROL) 1.25 MG (50000 UNIT) PO CAPS: 50000.0000 [IU] | ORAL_CAPSULE | ORAL | 0 refills | Status: DC

## 2020-11-14 MED ORDER — PHENTERMINE HCL 8 MG PO TABS
12.0000 mg | ORAL_TABLET | Freq: Every morning | ORAL | 0 refills | Status: DC
Start: 2020-11-14 — End: 2020-12-20

## 2020-11-20 NOTE — Progress Notes (Signed)
Chief Complaint:   OBESITY Mackenzie Watson is here to discuss her progress with her obesity treatment plan along with follow-up of her obesity related diagnoses. Mackenzie Watson is on the [employer] + 100 calories and states she is following her eating plan approximately 75% of the time. Mackenzie Watson states she is walking 25-30 minutes 2-3 times per week.  Today's visit was #: 32 Starting weight: 249 lbs Starting date: 10/07/2018 Today's weight: 230 lbs Today's date: 11/14/2020 Total lbs lost to date: 19 Total lbs lost since last in-office visit: 0  Interim History: Mackenzie Watson had a relaxing 4th of July. She had a week of professional development, which was like a stem camp. She is thinking she will go back to her prior job. She has been sticking to [employer] plan and adding chicken. She denies hunger during the day. She is trying to incorporate healthy snacks. Insurance no longer covers Mackenzie Watson.  Subjective:   1. Polyphagia Mackenzie Watson is on 8 mg Phentermine and 50 mg Topamax. She reports no real change in control of hunger or eating. Her BP is controlled.  2. Vitamin D deficiency She denies nausea, vomiting, and muscle weakness but notes fatigue. Pt is on prescription Vit D.  3. At risk for side effect of medication Mackenzie Watson is at risk for side effects of medication due to increasing dose of Phentermine and Topamax.  Assessment/Plan:   1. Polyphagia Intensive lifestyle modifications are the first line treatment for this issue. We discussed several lifestyle modifications today and she will continue to work on diet, exercise and weight loss efforts. Orders and follow up as documented in patient record. Increase Phentermine to 12 mg and increase Topamax to 75 mg.   Counseling Polyphagia is excessive hunger. Causes can include: low blood sugars, hypERthyroidism, PMS, lack of sleep, stress, insulin resistance, diabetes, certain medications, and diets that are deficient in protein and fiber.    Increase and Fill- Phentermine HCl 8 MG TABS; Take 12 mg by mouth in the morning. Fill on 11/22/20  Dispense: 28 tablet; Refill: 0 Increase and Fill- topiramate (TOPAMAX) 25 MG tablet; Take 3 tablets (75 mg total) by mouth daily.  Dispense: 90 tablet; Refill: 0  2. Vitamin D deficiency Low Vitamin D level contributes to fatigue and are associated with obesity, breast, and colon cancer. She agrees to continue to take prescription Vitamin D @50 ,000 IU every 14 days and will follow-up for routine testing of Vitamin D, at least 2-3 times per year to avoid over-replacement.  Refill- Vitamin D, Ergocalciferol, (DRISDOL) 1.25 MG (50000 UNIT) CAPS capsule; Take 1 capsule (50,000 Units total) by mouth every 14 (fourteen) days.  Dispense: 4 capsule; Refill: 0  3. At risk for side effect of medication Mackenzie Watson was given approximately 15 minutes of drug side effect counseling today.  We discussed side effect possibility and risk versus benefits. Mackenzie Watson agreed to the medication and will contact this office if these side effects are intolerable.  Repetitive spaced learning was employed today to elicit superior memory formation and behavioral change.   4. Obesity BMI 37  Mackenzie Watson is currently in the action stage of change. As such, her goal is to continue with weight loss efforts. She has agreed to the Mackenzie Watson + 100 calories.   Exercise goals: All adults should avoid inactivity. Some physical activity is better than none, and adults who participate in any amount of physical activity gain some health benefits. Increase resistance activity to 10-15 minutes 2-3 times a week.  Behavioral modification  strategies: increasing lean protein intake.  Mackenzie Watson has agreed to follow-up with our clinic in 3 weeks. She was informed of the importance of frequent follow-up visits to maximize her success with intensive lifestyle modifications for her multiple health conditions.   Objective:   Blood pressure  124/89, pulse 76, temperature 98.4 F (36.9 C), height 5\' 6"  (1.676 m), weight 230 lb (104.3 kg), SpO2 97 %. Body mass index is 37.12 kg/m.  General: Cooperative, alert, well developed, in no acute distress. HEENT: Conjunctivae and lids unremarkable. Cardiovascular: Regular rhythm.  Lungs: Normal work of breathing. Neurologic: No focal deficits.   Lab Results  Component Value Date   CREATININE 0.91 05/15/2020   BUN 13 05/15/2020   NA 139 05/15/2020   K 3.6 05/15/2020   CL 101 05/15/2020   CO2 26 05/15/2020   Lab Results  Component Value Date   ALT 12 05/15/2020   AST 11 05/15/2020   ALKPHOS 73 05/15/2020   BILITOT 0.4 05/15/2020   Lab Results  Component Value Date   HGBA1C 5.7 (H) 05/15/2020   HGBA1C 5.7 (H) 12/14/2019   HGBA1C 5.7 (H) 07/13/2019   HGBA1C 5.7 (H) 01/26/2019   HGBA1C 5.8 (H) 10/07/2018   Lab Results  Component Value Date   INSULIN 12.4 05/15/2020   INSULIN 14.9 12/14/2019   INSULIN 11.2 07/13/2019   INSULIN 17.4 01/26/2019   INSULIN 16.2 10/07/2018   Lab Results  Component Value Date   TSH 1.310 10/07/2018   Lab Results  Component Value Date   CHOL 158 12/14/2019   HDL 48 12/14/2019   LDLCALC 94 12/14/2019   TRIG 82 12/14/2019   Lab Results  Component Value Date   VD25OH 47.6 05/15/2020   VD25OH 54.0 12/14/2019   VD25OH 51.8 07/13/2019   Lab Results  Component Value Date   WBC 5.9 10/07/2018   HGB 13.5 10/07/2018   HCT 42.9 10/07/2018   MCV 81 10/07/2018    Attestation Statements:   Reviewed by clinician on day of visit: allergies, medications, problem list, medical history, surgical history, family history, social history, and previous encounter notes.  12/07/2018, CMA, am acting as transcriptionist for Edmund Hilda, MD.  I have reviewed the above documentation for accuracy and completeness, and I agree with the above. - Reuben Likes, MD

## 2020-12-05 ENCOUNTER — Ambulatory Visit (INDEPENDENT_AMBULATORY_CARE_PROVIDER_SITE_OTHER): Payer: BC Managed Care – PPO | Admitting: Family Medicine

## 2020-12-05 ENCOUNTER — Encounter (INDEPENDENT_AMBULATORY_CARE_PROVIDER_SITE_OTHER): Payer: Self-pay | Admitting: Family Medicine

## 2020-12-05 ENCOUNTER — Other Ambulatory Visit (INDEPENDENT_AMBULATORY_CARE_PROVIDER_SITE_OTHER): Payer: Self-pay

## 2020-12-05 ENCOUNTER — Other Ambulatory Visit: Payer: Self-pay

## 2020-12-05 VITALS — BP 127/85 | HR 85 | Temp 98.5°F | Ht 66.0 in | Wt 228.0 lb

## 2020-12-05 DIAGNOSIS — R632 Polyphagia: Secondary | ICD-10-CM | POA: Diagnosis not present

## 2020-12-05 DIAGNOSIS — Z9189 Other specified personal risk factors, not elsewhere classified: Secondary | ICD-10-CM | POA: Diagnosis not present

## 2020-12-05 DIAGNOSIS — I10 Essential (primary) hypertension: Secondary | ICD-10-CM

## 2020-12-05 DIAGNOSIS — Z6841 Body Mass Index (BMI) 40.0 and over, adult: Secondary | ICD-10-CM

## 2020-12-05 DIAGNOSIS — E559 Vitamin D deficiency, unspecified: Secondary | ICD-10-CM

## 2020-12-05 MED ORDER — CHLORTHALIDONE 25 MG PO TABS: 12.5000 mg | ORAL_TABLET | Freq: Every day | ORAL | 0 refills | Status: DC

## 2020-12-06 NOTE — Progress Notes (Signed)
Chief Complaint:   OBESITY Mackenzie Watson is here to discuss her progress with her obesity treatment plan along with follow-up of her obesity related diagnoses. Mackenzie Watson is on the [employer] + 100 calories and states she is following her eating plan approximately 75% of the time. Mackenzie Watson states she is walking 30 minutes 2 times per week.  Today's visit was #: 33 Starting weight: 249 lbs Starting date: 10/07/2018 Today's weight: 228 lbs Today's date: 12/05/2020 Total lbs lost to date: 21 Total lbs lost since last in-office visit: 2  Interim History: Mackenzie Watson returns to school next week and voices she isn't ready to go back. She mentions she doesn't feel relaxed after her summer break. She is still doing Engineer, materials. She is still working on getting water in and cutting back on other liquids. Her goal is to use her planning time to get some walking in. She still likes Pescatarian plan, as it is easier for her to prepare food and prep.  Subjective:   1. Essential hypertension BP well controlled today. Britney denies chest pain/chest pressure/headache.  2. Azura Tufaro is on Phentermine and topiramate. She reports good control of food choices and intake.  3. Vitamin D deficiency Mackenzie Watson denies nausea, vomiting, and muscle weakness but notes fatigue. Her last Vit D level was 34.5.  4. At risk for heart disease Mackenzie Watson is at a higher than average risk for cardiovascular disease due to obesity.   Assessment/Plan:   1. Essential hypertension Micalah is working on healthy weight loss and exercise to improve blood pressure control. We will watch for signs of hypotension as she continues her lifestyle modifications.  Refill- chlorthalidone (HYGROTON) 25 MG tablet; Take 0.5 tablets (12.5 mg total) by mouth daily.  Dispense: 45 tablet; Refill: 0  2. Polyphagia Continue Phentermine and topiramate as directed. No refill needed today. Intensive lifestyle modifications are the first  line treatment for this issue. We discussed several lifestyle modifications today and she will continue to work on diet, exercise and weight loss efforts. Orders and follow up as documented in patient record.  Counseling Polyphagia is excessive hunger. Causes can include: low blood sugars, hypERthyroidism, PMS, lack of sleep, stress, insulin resistance, diabetes, certain medications, and diets that are deficient in protein and fiber.   3. Vitamin D deficiency Low Vitamin D level contributes to fatigue and are associated with obesity, breast, and colon cancer. She agrees to continue to take prescription Vitamin D @50 ,000 IU every week and will follow-up for routine testing of Vitamin D, at least 2-3 times per year to avoid over-replacement.  4. At risk for heart disease Mackenzie Watson was given approximately 15 minutes of coronary artery disease prevention counseling today. She is 50 y.o. female and has risk factors for heart disease including obesity. We discussed intensive lifestyle modifications today with an emphasis on specific weight loss instructions and strategies.   Repetitive spaced learning was employed today to elicit superior memory formation and behavioral change.  5. Obesity with current BMI of 36.9  Mackenzie Watson is currently in the action stage of change. As such, her goal is to continue with weight loss efforts. She has agreed to the [name] + 100 calories.   Exercise goals: All adults should avoid inactivity. Some physical activity is better than none, and adults who participate in any amount of physical activity gain some health benefits. Pt is to add resistance training 10 minutes 3 times a week.  Behavioral modification strategies: increasing lean protein intake, meal planning  and cooking strategies, keeping healthy foods in the home, and planning for success.  Mackenzie Watson has agreed to follow-up with our clinic in 3 weeks. She was informed of the importance of frequent follow-up  visits to maximize her success with intensive lifestyle modifications for her multiple health conditions.   Objective:   Blood pressure 127/85, pulse 85, temperature 98.5 F (36.9 C), height 5\' 6"  (1.676 m), weight 228 lb (103.4 kg), SpO2 99 %. Body mass index is 36.8 kg/m.  General: Cooperative, alert, well developed, in no acute distress. HEENT: Conjunctivae and lids unremarkable. Cardiovascular: Regular rhythm.  Lungs: Normal work of breathing. Neurologic: No focal deficits.   Lab Results  Component Value Date   CREATININE 1.0 10/16/2020   BUN 14 10/16/2020   NA 140 10/16/2020   K 3.5 10/16/2020   CL 105 10/16/2020   CO2 27 (A) 10/16/2020   Lab Results  Component Value Date   ALT 16 10/16/2020   AST 11 05/15/2020   ALKPHOS 61 10/16/2020   BILITOT 0.4 05/15/2020   Lab Results  Component Value Date   HGBA1C 5.9 10/16/2020   HGBA1C 5.7 (H) 05/15/2020   HGBA1C 5.7 (H) 12/14/2019   HGBA1C 5.7 (H) 07/13/2019   HGBA1C 5.7 (H) 01/26/2019   Lab Results  Component Value Date   INSULIN 12.4 05/15/2020   INSULIN 14.9 12/14/2019   INSULIN 11.2 07/13/2019   INSULIN 17.4 01/26/2019   INSULIN 16.2 10/07/2018   Lab Results  Component Value Date   TSH 1.310 10/07/2018   Lab Results  Component Value Date   CHOL 158 12/14/2019   HDL 48 12/14/2019   LDLCALC 94 12/14/2019   TRIG 82 12/14/2019   Lab Results  Component Value Date   VD25OH 34.5 10/16/2020   VD25OH 47.6 05/15/2020   VD25OH 54.0 12/14/2019   Lab Results  Component Value Date   WBC 5.9 10/07/2018   HGB 13.5 10/07/2018   HCT 42.9 10/07/2018   MCV 81 10/07/2018    Attestation Statements:   Reviewed by clinician on day of visit: allergies, medications, problem list, medical history, surgical history, family history, social history, and previous encounter notes.  12/07/2018, CMA, am acting as transcriptionist for Edmund Hilda, MD.   I have reviewed the above documentation for accuracy and  completeness, and I agree with the above. - Reuben Likes, MD

## 2020-12-12 ENCOUNTER — Encounter (INDEPENDENT_AMBULATORY_CARE_PROVIDER_SITE_OTHER): Payer: Self-pay

## 2020-12-12 ENCOUNTER — Other Ambulatory Visit (INDEPENDENT_AMBULATORY_CARE_PROVIDER_SITE_OTHER): Payer: Self-pay | Admitting: Family Medicine

## 2020-12-12 DIAGNOSIS — R632 Polyphagia: Secondary | ICD-10-CM

## 2020-12-12 NOTE — Telephone Encounter (Signed)
Message sent to pt-CAS 

## 2020-12-14 NOTE — Telephone Encounter (Signed)
**Needs refill of Lomaira and topiramate**  LAST APPOINTMENT DATE: 12/05/20 NEXT APPOINTMENT DATE: 01/02/21   [id number] - [place] ([place]), [place] - [address] [place] ([place]) [place] [address] Phone: [phone] Fax: [phone]  [facility] - [place], [place] - [address] [place] [address] Phone: [phone] Fax: [phone]  [facility] - [place], [place] - [address] [place] [address] Phone: [phone] Fax: [phone]  [id number] - [place], [place] - [address] [place] [address] Phone: [phone] Fax: [phone]  Patient is requesting a refill of the following medications: Pending Prescriptions:                       Disp   Refills   topiramate (TOPAMAX) 25 MG tablet [Pharmac*90 tab*0       Sig: TAKE 3 TABLETS BY MOUTH DAILY   Date last filled: 11/14/20 Previously prescribed by Dr Lawson Radar  Lab Results      Component                Value               Date                      HGBA1C                   5.9                 10/16/2020                HGBA1C                   5.7 (H)             05/15/2020                HGBA1C                   5.7 (H)             12/14/2019           Lab Results      Component                Value               Date                      LDLCALC                  94                  12/14/2019                CREATININE               1.0                 10/16/2020           Lab Results      Component                Value               Date                      VD25OH  34.5                10/16/2020                VD25OH                   47.6                05/15/2020                VD25OH                   54.0                12/14/2019            BP Readings from Last 3 Encounters: 12/05/20 : 127/85 11/14/20 : 124/89 10/24/20 : 123/84

## 2020-12-15 ENCOUNTER — Other Ambulatory Visit (INDEPENDENT_AMBULATORY_CARE_PROVIDER_SITE_OTHER): Payer: Self-pay | Admitting: Family Medicine

## 2020-12-15 DIAGNOSIS — E559 Vitamin D deficiency, unspecified: Secondary | ICD-10-CM

## 2020-12-18 NOTE — Telephone Encounter (Signed)
Dr.Ukleja 

## 2020-12-20 ENCOUNTER — Ambulatory Visit (INDEPENDENT_AMBULATORY_CARE_PROVIDER_SITE_OTHER): Payer: BC Managed Care – PPO | Admitting: Family Medicine

## 2020-12-20 ENCOUNTER — Other Ambulatory Visit: Payer: Self-pay

## 2020-12-20 ENCOUNTER — Encounter (INDEPENDENT_AMBULATORY_CARE_PROVIDER_SITE_OTHER): Payer: Self-pay | Admitting: Family Medicine

## 2020-12-20 VITALS — BP 142/84 | HR 77 | Temp 98.4°F | Ht 66.0 in | Wt 222.0 lb

## 2020-12-20 DIAGNOSIS — Z6841 Body Mass Index (BMI) 40.0 and over, adult: Secondary | ICD-10-CM

## 2020-12-20 DIAGNOSIS — Z9189 Other specified personal risk factors, not elsewhere classified: Secondary | ICD-10-CM | POA: Diagnosis not present

## 2020-12-20 DIAGNOSIS — I10 Essential (primary) hypertension: Secondary | ICD-10-CM | POA: Diagnosis not present

## 2020-12-20 DIAGNOSIS — R632 Polyphagia: Secondary | ICD-10-CM

## 2020-12-20 MED ORDER — PHENTERMINE HCL 8 MG PO TABS: 12.0000 mg | ORAL_TABLET | Freq: Every morning | ORAL | 0 refills | Status: DC

## 2020-12-20 MED ORDER — TOPIRAMATE 25 MG PO TABS: 75.0000 mg | ORAL_TABLET | Freq: Every day | ORAL | 0 refills | Status: DC

## 2020-12-21 NOTE — Progress Notes (Signed)
Chief Complaint:   OBESITY Mackenzie Watson is here to discuss her progress with her obesity treatment plan along with follow-up of her obesity related diagnoses. Mackenzie Watson is on the [employer] + 100 calories and states she is following her eating plan approximately 75-80% of the time. Mackenzie Watson states she is walking 30 minutes 2-3 times per week.  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 She has been able to still be on point with her meal plan. She doesn't have much of an appetite but is packing and getting it all in. No upcoming plans for Labor Day.  Subjective:   1. Polyphagia Mackenzie Watson is doing well on Phentermine/Topiramate combo. She is still able to get all food in.  2. Essential hypertension BP borderline today. Pt denies chest pain/chest pressure/headache.  BP Readings from Last 3 Encounters:  12/20/20 (!) 142/84  12/05/20 127/85  11/14/20 124/89   Lab Results  Component Value Date   CREATININE 1.0 10/16/2020   CREATININE 0.91 05/15/2020   CREATININE 0.87 12/14/2019   3. At risk for deficient intake of food The patient is at a higher than average risk of deficient intake of food due to continuing 12 mg Phentermine.  Assessment/Plan:   1. Polyphagia Intensive lifestyle modifications are the first line treatment for this issue. We discussed several lifestyle modifications today and she will continue to work on diet, exercise and weight loss efforts. Orders and follow up as documented in patient record.  Counseling Polyphagia is excessive hunger. Causes can include: low blood sugars, hypERthyroidism, PMS, lack of sleep, stress, insulin resistance, diabetes, certain medications, and diets that are deficient in  protein and fiber.   Refill- Phentermine HCl 8 MG TABS; Take 12 mg by mouth in the morning. Fill on 11/22/20  Dispense: 45 tablet; Refill: 0 Refill- topiramate (TOPAMAX) 25 MG tablet; Take 3 tablets (75 mg total) by mouth daily.  Dispense: 90 tablet; Refill: 0  2. Essential hypertension Mackenzie Watson is working on healthy weight loss and exercise to improve blood pressure control. We will watch for signs of hypotension as she continues her lifestyle modifications. Continue chlorthalidone with no change in dose.  3. At risk for deficient intake of food Mackenzie Watson was given approximately 15 minutes of deficit intake of food prevention counseling today. Mackenzie Watson is at risk for eating too few calories based on current food recall. She was encouraged to focus on meeting caloric and protein goals according to her recommended meal plan.    4. Obesity with current BMI of 35.9  Mackenzie Watson is currently in the action stage of change. As such, her goal is to continue with weight loss efforts. She has agreed to the [employer] + 100 calories.   Exercise goals:  As is- Pt is planning to start weight bearing activity the next few weeks.  Behavioral modification strategies: increasing lean protein intake, meal planning and cooking strategies, and keeping healthy foods in the home.  Mackenzie Watson has agreed to follow-up with our clinic in 3-4 weeks. She was informed of the importance of frequent follow-up visits to maximize her success with intensive lifestyle modifications for her multiple health conditions.   Objective:   Blood pressure (!) 142/84, pulse 77, temperature 98.4 F (36.9 C), height 5\' 6"  (  1.676 m), weight 222 lb (100.7 kg), SpO2 97 %. Body mass index is 35.83 kg/m.  General: Cooperative, alert, well developed, in no acute distress. HEENT: Conjunctivae and lids unremarkable. Cardiovascular: Regular rhythm.  Lungs: Normal work of breathing. Neurologic: No focal deficits.   Lab Results  Component  Value Date   CREATININE 1.0 10/16/2020   BUN 14 10/16/2020   NA 140 10/16/2020   K 3.5 10/16/2020   CL 105 10/16/2020   CO2 27 (A) 10/16/2020   Lab Results  Component Value Date   ALT 16 10/16/2020   AST 11 05/15/2020   ALKPHOS 61 10/16/2020   BILITOT 0.4 05/15/2020   Lab Results  Component Value Date   HGBA1C 5.9 10/16/2020   HGBA1C 5.7 (H) 05/15/2020   HGBA1C 5.7 (H) 12/14/2019   HGBA1C 5.7 (H) 07/13/2019   HGBA1C 5.7 (H) 01/26/2019   Lab Results  Component Value Date   INSULIN 12.4 05/15/2020   INSULIN 14.9 12/14/2019   INSULIN 11.2 07/13/2019   INSULIN 17.4 01/26/2019   INSULIN 16.2 10/07/2018   Lab Results  Component Value Date   TSH 1.310 10/07/2018   Lab Results  Component Value Date   CHOL 158 12/14/2019   HDL 48 12/14/2019   LDLCALC 94 12/14/2019   TRIG 82 12/14/2019   Lab Results  Component Value Date   VD25OH 34.5 10/16/2020   VD25OH 47.6 05/15/2020   VD25OH 54.0 12/14/2019   Lab Results  Component Value Date   WBC 5.9 10/07/2018   HGB 13.5 10/07/2018   HCT 42.9 10/07/2018   MCV 81 10/07/2018    Attestation Statements:   Reviewed by clinician on day of visit: allergies, medications, problem list, medical history, surgical history, family history, social history, and previous encounter notes.  Edmund Hilda, CMA, am acting as transcriptionist for Reuben Likes, MD.   I have reviewed the above documentation for accuracy and completeness, and I agree with the above. - Reuben Likes, MD

## 2021-01-02 ENCOUNTER — Ambulatory Visit (INDEPENDENT_AMBULATORY_CARE_PROVIDER_SITE_OTHER): Payer: BC Managed Care – PPO | Admitting: Family Medicine

## 2021-01-15 ENCOUNTER — Other Ambulatory Visit: Payer: Self-pay

## 2021-01-15 ENCOUNTER — Ambulatory Visit (INDEPENDENT_AMBULATORY_CARE_PROVIDER_SITE_OTHER): Payer: BC Managed Care – PPO | Admitting: Family Medicine

## 2021-01-15 ENCOUNTER — Encounter (INDEPENDENT_AMBULATORY_CARE_PROVIDER_SITE_OTHER): Payer: Self-pay | Admitting: Family Medicine

## 2021-01-15 VITALS — BP 117/76 | HR 75 | Temp 97.4°F | Ht 66.0 in | Wt 220.0 lb

## 2021-01-15 DIAGNOSIS — Z6841 Body Mass Index (BMI) 40.0 and over, adult: Secondary | ICD-10-CM

## 2021-01-15 DIAGNOSIS — E559 Vitamin D deficiency, unspecified: Secondary | ICD-10-CM | POA: Diagnosis not present

## 2021-01-15 DIAGNOSIS — Z9189 Other specified personal risk factors, not elsewhere classified: Secondary | ICD-10-CM | POA: Diagnosis not present

## 2021-01-15 DIAGNOSIS — R632 Polyphagia: Secondary | ICD-10-CM

## 2021-01-15 MED ORDER — TOPIRAMATE 25 MG PO TABS: 75.0000 mg | ORAL_TABLET | Freq: Every day | ORAL | 0 refills | Status: DC

## 2021-01-15 MED ORDER — VITAMIN D (ERGOCALCIFEROL) 1.25 MG (50000 UNIT) PO CAPS: 50000.0000 [IU] | ORAL_CAPSULE | ORAL | 0 refills | Status: DC

## 2021-01-15 MED ORDER — PHENTERMINE HCL 8 MG PO TABS: 12.0000 mg | ORAL_TABLET | Freq: Every morning | ORAL | 0 refills | Status: DC

## 2021-01-16 NOTE — Progress Notes (Signed)
Chief Complaint:   OBESITY Mackenzie Watson is here to discuss her progress with her obesity treatment plan along with follow-up of her obesity related diagnoses. Mackenzie Watson is on the [employer] + 100 calories and states she is following her eating plan approximately 75-80% of the time. [name] states she is walking 15-20 minutes 3 times per week.  Today's visit was #: 35 Starting weight: 249 lbs Starting date: 10/07/2018 Today's weight: 220 lbs Today's date: 01/15/2021 Total lbs lost to date: 29 Total lbs lost since last in-office visit: 2  Interim History: Mackenzie Watson is still working on routine (week 5 if school). She is still working on water intake to where it was prior to school being in session. Over the next few weeks, pt is just trying to get into a routine of work life balance. Pt denies hunger on Pescatarian plan. She is realizing that she can eat out and stay on meal plan more easily.  Subjective:   1. Vitamin D deficiency Paraskevi denies nausea, vomiting, and muscle weakness but notes fatigue. Pt is on prescription Vit D. Her last Vit D level was 34.5.  2. Polyphagia Pt is on phentermine 12 mg and Topamax 75 mg daily with no current side effects.  3. At risk for activity intolerance [name] is at risk for exercise intolerance due to decreased exercise from significant busy schedule.  Assessment/Plan:   1. Vitamin D deficiency Low Vitamin D level contributes to fatigue and are associated with obesity, breast, and colon cancer. She agrees to continue to take prescription Vitamin D 50,000 IU every week and will follow-up for routine testing of Vitamin D, at least 2-3 times per year to avoid over-replacement.  Refill- Vitamin D, Ergocalciferol, (DRISDOL) 1.25 MG (50000 UNIT) CAPS capsule; Take 1 capsule (50,000 Units total) by mouth every 7 (seven) days.  Dispense: 4 capsule; Refill: 0  2. Polyphagia Intensive lifestyle modifications are the first line treatment for this issue.  We discussed several lifestyle modifications today and she will continue to work on diet, exercise and weight loss efforts. Orders and follow up as documented in patient record.  Counseling Polyphagia is excessive hunger. Causes can include: low blood sugars, hypERthyroidism, PMS, lack of sleep, stress, insulin resistance, diabetes, certain medications, and diets that are deficient in protein and fiber.   Refill- Phentermine HCl 8 MG TABS; Take 12 mg by mouth in the morning. Fill on 11/22/20  Dispense: 45 tablet; Refill: 0 Refill- topiramate (TOPAMAX) 25 MG tablet; Take 3 tablets (75 mg total) by mouth daily.  Dispense: 90 tablet; Refill: 0  3. At risk for activity intolerance Mackenzie Watson was given approximately 15 minutes of exercise intolerance counseling today. She is 50 y.o. female and has risk factors exercise intolerance including obesity. We discussed intensive lifestyle modifications today with an emphasis on specific weight loss instructions and strategies. Ashely will slowly increase activity as tolerated.  Repetitive spaced learning was employed today to elicit superior memory formation and behavioral change.   4. Obesity with current BMI of 35.6  Mackenzie Watson is currently in the action stage of change. As such, her goal is to continue with weight loss efforts. She has agreed to the [employer] + 100 calories.   Exercise goals: All adults should avoid inactivity. Some physical activity is better than none, and adults who participate in any amount of physical activity gain some health benefits. Add 10 minutes of resistance training 3 times a week.  Behavioral modification strategies: increasing lean protein intake, increasing  water intake, meal planning and cooking strategies, and keeping healthy foods in the home.  Mackenzie Watson has agreed to follow-up with our clinic in 3 weeks. She was informed of the importance of frequent follow-up visits to maximize her success with intensive lifestyle  modifications for her multiple health conditions.   Objective:   Blood pressure 117/76, pulse 75, temperature (!) 97.4 F (36.3 C), height 5\' 6"  (1.676 m), weight 220 lb (99.8 kg), SpO2 98 %. Body mass index is 35.51 kg/m.  General: Cooperative, alert, well developed, in no acute distress. HEENT: Conjunctivae and lids unremarkable. Cardiovascular: Regular rhythm.  Lungs: Normal work of breathing. Neurologic: No focal deficits.   Lab Results  Component Value Date   CREATININE 1.0 10/16/2020   BUN 14 10/16/2020   NA 140 10/16/2020   K 3.5 10/16/2020   CL 105 10/16/2020   CO2 27 (A) 10/16/2020   Lab Results  Component Value Date   ALT 16 10/16/2020   AST 11 05/15/2020   ALKPHOS 61 10/16/2020   BILITOT 0.4 05/15/2020   Lab Results  Component Value Date   HGBA1C 5.9 10/16/2020   HGBA1C 5.7 (H) 05/15/2020   HGBA1C 5.7 (H) 12/14/2019   HGBA1C 5.7 (H) 07/13/2019   HGBA1C 5.7 (H) 01/26/2019   Lab Results  Component Value Date   INSULIN 12.4 05/15/2020   INSULIN 14.9 12/14/2019   INSULIN 11.2 07/13/2019   INSULIN 17.4 01/26/2019   INSULIN 16.2 10/07/2018   Lab Results  Component Value Date   TSH 1.310 10/07/2018   Lab Results  Component Value Date   CHOL 158 12/14/2019   HDL 48 12/14/2019   LDLCALC 94 12/14/2019   TRIG 82 12/14/2019   Lab Results  Component Value Date   VD25OH 34.5 10/16/2020   VD25OH 47.6 05/15/2020   VD25OH 54.0 12/14/2019   Lab Results  Component Value Date   WBC 5.9 10/07/2018   HGB 13.5 10/07/2018   HCT 42.9 10/07/2018   MCV 81 10/07/2018    Attestation Statements:   Reviewed by clinician on day of visit: allergies, medications, problem list, medical history, surgical history, family history, social history, and previous encounter notes.  12/07/2018, CMA, am acting as transcriptionist for Edmund Hilda, MD.   I have reviewed the above documentation for accuracy and completeness, and I agree with the above. -  Reuben Likes, MD

## 2021-01-24 ENCOUNTER — Other Ambulatory Visit (INDEPENDENT_AMBULATORY_CARE_PROVIDER_SITE_OTHER): Payer: Self-pay | Admitting: Family Medicine

## 2021-01-24 DIAGNOSIS — R632 Polyphagia: Secondary | ICD-10-CM

## 2021-01-25 ENCOUNTER — Ambulatory Visit (INDEPENDENT_AMBULATORY_CARE_PROVIDER_SITE_OTHER): Payer: BC Managed Care – PPO | Admitting: Family Medicine

## 2021-02-08 ENCOUNTER — Ambulatory Visit (INDEPENDENT_AMBULATORY_CARE_PROVIDER_SITE_OTHER): Payer: BC Managed Care – PPO | Admitting: Family Medicine

## 2021-02-12 ENCOUNTER — Encounter (INDEPENDENT_AMBULATORY_CARE_PROVIDER_SITE_OTHER): Payer: Self-pay | Admitting: Family Medicine

## 2021-02-12 ENCOUNTER — Other Ambulatory Visit: Payer: Self-pay

## 2021-02-12 ENCOUNTER — Ambulatory Visit (INDEPENDENT_AMBULATORY_CARE_PROVIDER_SITE_OTHER): Payer: BC Managed Care – PPO | Admitting: Family Medicine

## 2021-02-12 ENCOUNTER — Other Ambulatory Visit (INDEPENDENT_AMBULATORY_CARE_PROVIDER_SITE_OTHER): Payer: Self-pay | Admitting: Bariatrics

## 2021-02-12 VITALS — BP 132/85 | HR 66 | Temp 97.6°F | Ht 66.0 in | Wt 215.0 lb

## 2021-02-12 DIAGNOSIS — E559 Vitamin D deficiency, unspecified: Secondary | ICD-10-CM | POA: Diagnosis not present

## 2021-02-12 DIAGNOSIS — R632 Polyphagia: Secondary | ICD-10-CM | POA: Diagnosis not present

## 2021-02-12 DIAGNOSIS — Z6841 Body Mass Index (BMI) 40.0 and over, adult: Secondary | ICD-10-CM | POA: Diagnosis not present

## 2021-02-12 MED ORDER — PHENTERMINE HCL 8 MG PO TABS: 12.0000 mg | ORAL_TABLET | Freq: Every morning | ORAL | 0 refills | Status: DC

## 2021-02-12 MED ORDER — VITAMIN D (ERGOCALCIFEROL) 1.25 MG (50000 UNIT) PO CAPS: 50000.0000 [IU] | ORAL_CAPSULE | ORAL | 0 refills | Status: DC

## 2021-02-12 MED ORDER — TOPIRAMATE 25 MG PO TABS: 75.0000 mg | ORAL_TABLET | Freq: Every day | ORAL | 0 refills | Status: DC

## 2021-02-13 NOTE — Progress Notes (Signed)
Chief Complaint:   OBESITY Mackenzie Watson is here to discuss her progress with her obesity treatment plan along with follow-up of her obesity related diagnoses. Mackenzie Watson is on the [employer] plus 100 calories and sometimes chicken and states she is following her eating plan approximately 75-80% of the time. Mackenzie Watson states she is walking and weight training for 0 minutes 2-3 times per week.  Today's visit was #: 36 Starting weight: 249 lbs Starting date: 10/07/2018 Today's weight: 215 lbs Today's date: 02/12/2021 Total lbs lost to date: 34 lbs Total lbs lost since last in-office visit: 5 lbs  Interim History: Mackenzie Watson is doing well on the plan. She does get in breakfast and dinner on the plan. 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 She packs her lunch but does not always get time to eat. She is including some chicken in her meals. She has been on the pescatarian plan.  Subjective:   1. Polyphagia Mackenzie Watson is taking Phentermine 16 mg and Topiramate 7.5 mg daily and it is working well for appetite suppression. She denies side effects. Her blood pressure was within normal limits.   2. Vitamin D deficiency Mackenzie Watson's Vitamin D was low at 34.5. She is on prescription Vitamin D.  Lab Results  Component Value Date   VD25OH 34.5 10/16/2020   VD25OH 47.6 05/15/2020   VD25OH 54.0 12/14/2019    Assessment/Plan:   1. Polyphagia Intensive lifestyle modifications are the first line treatment for this issue. We discussed several lifestyle modifications today and she will continue to work on diet, exercise and weight loss efforts. We will refill Topiramate 25 mg (3 tabs) daily. We will have Dr. Manson Passey refill Phentermine 8 mg. PDMP reviewed.  - topiramate (TOPAMAX) 25 MG tablet; Take 3 tablets (75 mg total) by mouth daily.  Dispense: 90 tablet; Refill: 0  2. Vitamin D deficiency  We will refill Vitamin D prescription Vitamin D 50,000 IU every week and Mackenzie Watson will follow-up for  routine testing of Vitamin D, at least 2-3 times per year to avoid over-replacement.  - Vitamin D, Ergocalciferol, (DRISDOL) 1.25 MG (50000 UNIT) CAPS capsule; Take 1 capsule (50,000 Units total) by mouth every 7 (seven) days.  Dispense: 4 capsule; Refill: 0  3. Obesity with current BMI of 34.72 Mackenzie Watson is currently in the action stage of change. As such, her goal is to continue with weight loss efforts. She has agreed to the Category 2 Plan plus 100 calories or the Pescatarian Plan plus 100 calories.  Exercise goals:  As is.  Behavioral modification strategies: meal planning and cooking strategies.  Mackenzie Watson has agreed to follow-up with our clinic in 3 weeks.  Objective:   Blood pressure 132/85, pulse 66, temperature 97.6 F (36.4 C), height 5\' 6"  (1.676 m), weight 215 lb (97.5 kg), SpO2 98 %. Body mass index is 34.7 kg/m.  General: Cooperative, alert, well developed, in no acute distress. HEENT: Conjunctivae and lids unremarkable. Cardiovascular: Regular rhythm.  Lungs: Normal work of breathing. Neurologic: No focal deficits.   Lab Results  Component Value Date   CREATININE 1.0 10/16/2020   BUN 14 10/16/2020   NA 140 10/16/2020   K 3.5 10/16/2020   CL 105 10/16/2020   CO2 27 (A) 10/16/2020   Lab Results  Component Value Date   ALT 16 10/16/2020   AST 11 05/15/2020   ALKPHOS 61 10/16/2020   BILITOT 0.4 05/15/2020   Lab Results  Component Value Date   HGBA1C 5.9 10/16/2020  HGBA1C 5.7 (H) 05/15/2020   HGBA1C 5.7 (H) 12/14/2019   HGBA1C 5.7 (H) 07/13/2019   HGBA1C 5.7 (H) 01/26/2019   Lab Results  Component Value Date   INSULIN 12.4 05/15/2020   INSULIN 14.9 12/14/2019   INSULIN 11.2 07/13/2019   INSULIN 17.4 01/26/2019   INSULIN 16.2 10/07/2018   Lab Results  Component Value Date   TSH 1.310 10/07/2018   Lab Results  Component Value Date   CHOL 158 12/14/2019   HDL 48 12/14/2019   LDLCALC 94 12/14/2019   TRIG 82 12/14/2019   Lab Results  Component  Value Date   VD25OH 34.5 10/16/2020   VD25OH 47.6 05/15/2020   VD25OH 54.0 12/14/2019   Lab Results  Component Value Date   WBC 5.9 10/07/2018   HGB 13.5 10/07/2018   HCT 42.9 10/07/2018   MCV 81 10/07/2018   No results found for: IRON, TIBC, FERRITIN  Attestation Statements:   Reviewed by clinician on day of visit: allergies, medications, problem list, medical history, surgical history, family history, social history, and previous encounter notes.  I, Mackenzie Watson, RMA, am acting as Energy manager for [place], FNP.   I have reviewed the above documentation for accuracy and completeness, and I agree with the above. -  Mackenzie Sans, FNP

## 2021-02-27 ENCOUNTER — Other Ambulatory Visit (INDEPENDENT_AMBULATORY_CARE_PROVIDER_SITE_OTHER): Payer: Self-pay | Admitting: Family Medicine

## 2021-02-27 DIAGNOSIS — R632 Polyphagia: Secondary | ICD-10-CM

## 2021-02-27 NOTE — Telephone Encounter (Signed)
Mackenzie Watson 

## 2021-03-13 ENCOUNTER — Encounter (INDEPENDENT_AMBULATORY_CARE_PROVIDER_SITE_OTHER): Payer: Self-pay | Admitting: Family Medicine

## 2021-03-13 ENCOUNTER — Other Ambulatory Visit: Payer: Self-pay

## 2021-03-13 ENCOUNTER — Encounter (INDEPENDENT_AMBULATORY_CARE_PROVIDER_SITE_OTHER): Payer: Self-pay

## 2021-03-13 ENCOUNTER — Ambulatory Visit (INDEPENDENT_AMBULATORY_CARE_PROVIDER_SITE_OTHER): Payer: BC Managed Care – PPO | Admitting: Family Medicine

## 2021-03-13 VITALS — BP 110/77 | HR 84 | Temp 98.3°F | Ht 66.0 in | Wt 218.0 lb

## 2021-03-13 DIAGNOSIS — Z6841 Body Mass Index (BMI) 40.0 and over, adult: Secondary | ICD-10-CM

## 2021-03-13 DIAGNOSIS — I10 Essential (primary) hypertension: Secondary | ICD-10-CM | POA: Diagnosis not present

## 2021-03-13 DIAGNOSIS — E559 Vitamin D deficiency, unspecified: Secondary | ICD-10-CM

## 2021-03-13 DIAGNOSIS — R632 Polyphagia: Secondary | ICD-10-CM | POA: Diagnosis not present

## 2021-03-13 MED ORDER — TOPIRAMATE 100 MG PO TABS: 100.0000 mg | ORAL_TABLET | Freq: Every day | ORAL | 0 refills | Status: DC

## 2021-03-13 MED ORDER — VITAMIN D (ERGOCALCIFEROL) 1.25 MG (50000 UNIT) PO CAPS: 50000.0000 [IU] | ORAL_CAPSULE | ORAL | 0 refills | Status: DC

## 2021-03-13 MED ORDER — CHLORTHALIDONE 25 MG PO TABS: 12.5000 mg | ORAL_TABLET | Freq: Every day | ORAL | 0 refills | Status: DC

## 2021-03-13 MED ORDER — PHENTERMINE HCL 15 MG PO CAPS: 15.0000 mg | ORAL_CAPSULE | ORAL | 0 refills | Status: DC

## 2021-03-13 NOTE — Progress Notes (Signed)
Chief Complaint:   OBESITY Mackenzie Watson is here to discuss her progress with her obesity treatment plan along with follow-up of her obesity related diagnoses. Mackenzie Watson is on the Category 2 Plan + 100 calories and the Pescatarian Plan and states she is following her eating plan approximately 75-80% of the time. Mackenzie Watson states she is walking 30-45 minutes 2-3 times per week.  Today's visit was #: 37 Starting weight: 249 lbs Starting date: 10/07/2018 Today's weight: 218 lbs Today's date: 03/13/2021 Total lbs lost to date: 31 Total lbs lost since last in-office visit: 0  Interim History: Over the last few weeks, Mackenzie Watson has had a few days off and celebrated homecoming. She has enjoyed the scattered few days off over the last few weeks. She is noticing changes in inches in clothes. Pt is going home for Thanksgiving. She reports some increase in drive for sweets given upcoming holiday season stress.  Subjective:   1. Vitamin D deficiency Pt denies nausea, vomiting, and muscle weakness but notes fatigue. Her last Vit D level was 34.5.  2. Essential hypertension BP well controlled. Pt denies chest pain/chest pressure/headache/dizziness/lightheadedness.  3. Polyphagia Mackenzie Watson had some indulgence with homecoming recently. She denies other increase in hunger but has an increase in drive for sweets with increase in stress of upcoming holiday season.  Assessment/Plan:   1. Vitamin D deficiency Low Vitamin D level contributes to fatigue and are associated with obesity, breast, and colon cancer. She agrees to continue to take prescription Vitamin D 50,000 IU every week and will follow-up for routine testing of Vitamin D, at least 2-3 times per year to avoid over-replacement. Check labs today.  Refill- Vitamin D, Ergocalciferol, (DRISDOL) 1.25 MG (50000 UNIT) CAPS capsule; Take 1 capsule (50,000 Units total) by mouth every 7 (seven) days.  Dispense: 4 capsule; Refill: 0  - VITAMIN D 25 Hydroxy (Vit-D  Deficiency, Fractures)  2. Essential hypertension Mackenzie Watson is working on healthy weight loss and exercise to improve blood pressure control. We will watch for signs of hypotension as she continues her lifestyle modifications. Check labs today.  Refill- chlorthalidone (HYGROTON) 25 MG tablet; Take 0.5 tablets (12.5 mg total) by mouth daily.  Dispense: 45 tablet; Refill: 0  - Comprehensive metabolic panel  3. Polyphagia Mackenzie Watson will increase phentermine to 15 mg and topiramate to 100 mg. Intensive lifestyle modifications are the first line treatment for this issue. We discussed several lifestyle modifications today and she will continue to work on diet, exercise and weight loss efforts. Orders and follow up as documented in patient record. Check labs today.  Counseling Polyphagia is excessive hunger. Causes can include: low blood sugars, hypERthyroidism, PMS, lack of sleep, stress, insulin resistance, diabetes, certain medications, and diets that are deficient in protein and fiber.   Refill and Increase- phentermine 15 MG capsule; Take 1 capsule (15 mg total) by mouth every morning.  Dispense: 30 capsule; Refill: 0  Refill and Increase- topiramate (TOPAMAX) 100 MG tablet; Take 1 tablet (100 mg total) by mouth daily.  Dispense: 30 tablet; Refill: 0  - Hemoglobin A1c  4. Obesity with current BMI of 35.3  Mackenzie Watson is currently in the action stage of change. As such, her goal is to continue with weight loss efforts. She has agreed to the Category 2 Plan + 100 calories and the Pescatarian Plan.   Exercise goals: All adults should avoid inactivity. Some physical activity is better than none, and adults who participate in any amount of physical activity gain some  health benefits.  Behavioral modification strategies: increasing lean protein intake, meal planning and cooking strategies, keeping healthy foods in the home, and planning for success.  Mackenzie Watson has agreed to follow-up with our clinic in  3-4 weeks. She was informed of the importance of frequent follow-up visits to maximize her success with intensive lifestyle modifications for her multiple health conditions.   Mackenzie Watson was informed we would discuss her lab results at her next visit unless there is a critical issue that needs to be addressed sooner. Mackenzie Watson agreed to keep her next visit at the agreed upon time to discuss these results.  Objective:   Blood pressure 110/77, pulse 84, temperature 98.3 F (36.8 C), height 5\' 6"  (1.676 m), weight 218 lb (98.9 kg), SpO2 99 %. Body mass index is 35.19 kg/m.  General: Cooperative, alert, well developed, in no acute distress. HEENT: Conjunctivae and lids unremarkable. Cardiovascular: Regular rhythm.  Lungs: Normal work of breathing. Neurologic: No focal deficits.   Lab Results  Component Value Date   CREATININE 1.0 10/16/2020   BUN 14 10/16/2020   NA 140 10/16/2020   K 3.5 10/16/2020   CL 105 10/16/2020   CO2 27 (A) 10/16/2020   Lab Results  Component Value Date   ALT 16 10/16/2020   AST 11 05/15/2020   ALKPHOS 61 10/16/2020   BILITOT 0.4 05/15/2020   Lab Results  Component Value Date   HGBA1C 5.9 10/16/2020   HGBA1C 5.7 (H) 05/15/2020   HGBA1C 5.7 (H) 12/14/2019   HGBA1C 5.7 (H) 07/13/2019   HGBA1C 5.7 (H) 01/26/2019   Lab Results  Component Value Date   INSULIN 12.4 05/15/2020   INSULIN 14.9 12/14/2019   INSULIN 11.2 07/13/2019   INSULIN 17.4 01/26/2019   INSULIN 16.2 10/07/2018   Lab Results  Component Value Date   TSH 1.310 10/07/2018   Lab Results  Component Value Date   CHOL 158 12/14/2019   HDL 48 12/14/2019   LDLCALC 94 12/14/2019   TRIG 82 12/14/2019   Lab Results  Component Value Date   VD25OH 34.5 10/16/2020   VD25OH 47.6 05/15/2020   VD25OH 54.0 12/14/2019   Lab Results  Component Value Date   WBC 5.9 10/07/2018   HGB 13.5 10/07/2018   HCT 42.9 10/07/2018   MCV 81 10/07/2018    Attestation Statements:   Reviewed by  clinician on day of visit: allergies, medications, problem list, medical history, surgical history, family history, social history, and previous encounter notes.  12/07/2018, CMA, am acting as transcriptionist for Edmund Hilda, MD.   I have reviewed the above documentation for accuracy and completeness, and I agree with the above. - Reuben Likes, MD

## 2021-03-14 LAB — COMPREHENSIVE METABOLIC PANEL
ALT: 23 IU/L (ref 0–32)
AST: 22 IU/L (ref 0–40)
Albumin/Globulin Ratio: 1.5 (ref 1.2–2.2)
Albumin: 4.4 g/dL (ref 3.8–4.8)
Alkaline Phosphatase: 85 IU/L (ref 44–121)
BUN/Creatinine Ratio: 15 (ref 9–23)
BUN: 15 mg/dL (ref 6–24)
Bilirubin Total: 0.3 mg/dL (ref 0.0–1.2)
CO2: 24 mmol/L (ref 20–29)
Calcium: 9.4 mg/dL (ref 8.7–10.2)
Chloride: 102 mmol/L (ref 96–106)
Creatinine, Ser: 0.99 mg/dL (ref 0.57–1.00)
Globulin, Total: 3 g/dL (ref 1.5–4.5)
Glucose: 99 mg/dL (ref 70–99)
Potassium: 3.5 mmol/L (ref 3.5–5.2)
Sodium: 140 mmol/L (ref 134–144)
Total Protein: 7.4 g/dL (ref 6.0–8.5)
eGFR: 70 mL/min/{1.73_m2} (ref >59)

## 2021-03-14 LAB — HEMOGLOBIN A1C
Est. average glucose Bld gHb Est-mCnc: 120 mg/dL
Hgb A1c MFr Bld: 5.8 % — ABNORMAL HIGH (ref 4.8–5.6)

## 2021-03-14 LAB — VITAMIN D 25 HYDROXY (VIT D DEFICIENCY, FRACTURES): Vit D, 25-Hydroxy: 50.1 ng/mL (ref 30.0–100.0)

## 2021-03-30 ENCOUNTER — Other Ambulatory Visit (INDEPENDENT_AMBULATORY_CARE_PROVIDER_SITE_OTHER): Payer: Self-pay | Admitting: Family Medicine

## 2021-03-30 DIAGNOSIS — R632 Polyphagia: Secondary | ICD-10-CM

## 2021-04-11 ENCOUNTER — Encounter (INDEPENDENT_AMBULATORY_CARE_PROVIDER_SITE_OTHER): Payer: Self-pay | Admitting: Family Medicine

## 2021-04-11 ENCOUNTER — Ambulatory Visit (INDEPENDENT_AMBULATORY_CARE_PROVIDER_SITE_OTHER): Payer: BC Managed Care – PPO | Admitting: Family Medicine

## 2021-04-11 ENCOUNTER — Other Ambulatory Visit: Payer: Self-pay

## 2021-04-11 VITALS — BP 147/90 | HR 79 | Temp 98.1°F | Ht 66.0 in | Wt 219.0 lb

## 2021-04-11 DIAGNOSIS — I10 Essential (primary) hypertension: Secondary | ICD-10-CM | POA: Diagnosis not present

## 2021-04-11 DIAGNOSIS — R632 Polyphagia: Secondary | ICD-10-CM | POA: Diagnosis not present

## 2021-04-11 DIAGNOSIS — Z6841 Body Mass Index (BMI) 40.0 and over, adult: Secondary | ICD-10-CM

## 2021-04-11 MED ORDER — TOPIRAMATE 100 MG PO TABS: 100.0000 mg | ORAL_TABLET | Freq: Every day | ORAL | 0 refills | Status: DC

## 2021-04-11 MED ORDER — PHENTERMINE HCL 15 MG PO CAPS: 15.0000 mg | ORAL_CAPSULE | ORAL | 0 refills | Status: DC

## 2021-04-12 NOTE — Progress Notes (Signed)
Chief Complaint:   OBESITY Mackenzie Watson is here to discuss her progress with her obesity treatment plan along with follow-up of her obesity related diagnoses. Mackenzie Watson is on the Category 2 Plan + 100 calories and the Pescatarian Plan and states she is following her eating plan approximately 60-70% of the time. Mackenzie Watson states she is walking 30 minutes 2-3 times per week.  Today's visit was #: 38 Starting weight: 249 lbs Starting date: 10/07/2018 Today's weight: 219 lbs Today's date: 04/11/2021 Total lbs lost to date: 30 Total lbs lost since last in-office visit: 0  Interim History: Mackenzie Watson is getting into the Christmas holiday theme for next week. She went home for Thanksgiving. Her niece was inducted into the [employer] and her nephew will be getting a promotion at work. Pt birthday is in 2 weeks. She is not sure what she will do for her birthday. 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  Subjective:   1. Polyphagia Mackenzie Watson is on phentermine/topiramate combo and started the max dose 03/13/21. PDMP checked.   2. Essential hypertension BP slightly elevated. Pt denies chest pain/chest pressure/headache.  Assessment/Plan:   1. Polyphagia Intensive lifestyle modifications are the first line treatment for this issue. We discussed several lifestyle modifications today and she will continue to work on diet, exercise and weight loss efforts. Orders and follow up as documented in patient record. Continue current treatment plan.  Counseling Polyphagia is excessive hunger. Causes can include: low blood sugars, hypERthyroidism, PMS, lack of sleep, stress, insulin resistance, diabetes, certain medications, and diets that are deficient in protein and fiber.   Refill- phentermine 15 MG capsule; Take 1 capsule (15 mg total) by mouth every morning.  Dispense: 30 capsule; Refill: 0 Refill- topiramate (TOPAMAX) 100 MG tablet; Take 1 tablet (100 mg total) by mouth daily.   Dispense: 30 tablet; Refill: 0  2. Essential hypertension Mackenzie Watson is working on healthy weight loss and exercise to improve blood pressure control. We will watch for signs of hypotension as she continues her lifestyle modifications. Continue [place] and follow up on BP at next appt. Consider change in dose or medication if BP stays elevated.  3. Obesity with current BMI of 35.4  Mackenzie Watson is currently in the action stage of change. As such, her goal is to continue with weight loss efforts. She has agreed to the Category 2 Plan + 100 calories and the Pescatarian Plan.   Exercise goals: All adults should avoid inactivity. Some physical activity is better than none, and adults who participate in any amount of physical activity gain some health benefits.  Behavioral modification strategies: increasing lean protein intake, meal planning and cooking strategies, holiday eating strategies , and celebration eating strategies.  Mackenzie Watson has agreed to follow-up with our clinic in 4 weeks. She was informed of the importance of frequent follow-up visits to maximize her success with intensive lifestyle modifications for her multiple health conditions.   Objective:   Blood pressure (!) 147/90, pulse 79, temperature 98.1 F (36.7 C), height 5\' 6"  (1.676 m), weight 219 lb (99.3 kg), SpO2 99 %. Body mass index is 35.35 kg/m.  General: Cooperative, alert, well developed, in no acute distress. HEENT: Conjunctivae and lids unremarkable. Cardiovascular: Regular rhythm.  Lungs: Normal work of breathing. Neurologic: No focal deficits.   Lab Results  Component Value Date   CREATININE 0.99 03/13/2021   BUN 15 03/13/2021   NA 140 03/13/2021   K 3.5 03/13/2021   CL 102 03/13/2021  CO2 24 03/13/2021   Lab Results  Component Value Date   ALT 23 03/13/2021   AST 22 03/13/2021   ALKPHOS 85 03/13/2021   BILITOT 0.3 03/13/2021   Lab Results  Component Value Date   HGBA1C 5.8 (H) 03/13/2021   HGBA1C 5.9  10/16/2020   HGBA1C 5.7 (H) 05/15/2020   HGBA1C 5.7 (H) 12/14/2019   HGBA1C 5.7 (H) 07/13/2019   Lab Results  Component Value Date   INSULIN 12.4 05/15/2020   INSULIN 14.9 12/14/2019   INSULIN 11.2 07/13/2019   INSULIN 17.4 01/26/2019   INSULIN 16.2 10/07/2018   Lab Results  Component Value Date   TSH 1.310 10/07/2018   Lab Results  Component Value Date   CHOL 158 12/14/2019   HDL 48 12/14/2019   LDLCALC 94 12/14/2019   TRIG 82 12/14/2019   Lab Results  Component Value Date   VD25OH 50.1 03/13/2021   VD25OH 34.5 10/16/2020   VD25OH 47.6 05/15/2020   Lab Results  Component Value Date   WBC 5.9 10/07/2018   HGB 13.5 10/07/2018   HCT 42.9 10/07/2018   MCV 81 10/07/2018    Attestation Statements:   Reviewed by clinician on day of visit: allergies, medications, problem list, medical history, surgical history, family history, social history, and previous encounter notes.  Edmund Hilda, CMA, am acting as transcriptionist for Reuben Likes, MD.   I have reviewed the above documentation for accuracy and completeness, and I agree with the above. - Reuben Likes, MD

## 2021-04-14 ENCOUNTER — Other Ambulatory Visit (INDEPENDENT_AMBULATORY_CARE_PROVIDER_SITE_OTHER): Payer: Self-pay | Admitting: Family Medicine

## 2021-04-14 DIAGNOSIS — R632 Polyphagia: Secondary | ICD-10-CM

## 2021-05-08 ENCOUNTER — Encounter (INDEPENDENT_AMBULATORY_CARE_PROVIDER_SITE_OTHER): Payer: Self-pay | Admitting: Family Medicine

## 2021-05-08 ENCOUNTER — Ambulatory Visit (INDEPENDENT_AMBULATORY_CARE_PROVIDER_SITE_OTHER): Payer: BC Managed Care – PPO | Admitting: Family Medicine

## 2021-05-08 ENCOUNTER — Other Ambulatory Visit: Payer: Self-pay

## 2021-05-08 VITALS — BP 119/81 | HR 72 | Temp 98.2°F | Ht 66.0 in | Wt 221.0 lb

## 2021-05-08 DIAGNOSIS — R7303 Prediabetes: Secondary | ICD-10-CM

## 2021-05-08 DIAGNOSIS — R632 Polyphagia: Secondary | ICD-10-CM | POA: Diagnosis not present

## 2021-05-08 DIAGNOSIS — Z6841 Body Mass Index (BMI) 40.0 and over, adult: Secondary | ICD-10-CM

## 2021-05-08 MED ORDER — PHENTERMINE HCL 15 MG PO CAPS: 15.0000 mg | ORAL_CAPSULE | ORAL | 0 refills | Status: DC

## 2021-05-08 MED ORDER — TOPIRAMATE 100 MG PO TABS: 100.0000 mg | ORAL_TABLET | Freq: Every day | ORAL | 0 refills | Status: DC

## 2021-05-08 NOTE — Progress Notes (Signed)
Chief Complaint:   OBESITY Mackenzie Watson is here to discuss her progress with her obesity treatment plan along with follow-up of her obesity related diagnoses. Mackenzie Watson is on the Category 2 Plan + 100 calories and the Pescatarian Plan and states she is following her eating plan approximately 75% of the time. Mackenzie Watson states she is walking 2 times per week.  Today's visit was #: 39 Starting weight: 249 lbs Starting date: 10/07/2018 Today's weight: 221 lbs Today's date: 05/08/2021 Total lbs lost to date: 28 Total lbs lost since last in-office visit: 0  Interim History: Pt returned to work today after holiday hiatus. She had a relaxing holiday and stayed local. Pt celebrated her 50th birthday (had a surprise 50th birthday brunch). She stayed as on plan as she could- did indulge a bit over the holiday with cookies and candy. Pt started max dose makeshift Qsymia on 03/13/2021. She is actively trying to look for other employment.  Subjective:   1. Polyphagia Mackenzie Watson is on Phentermine/topiramate combo and is doing reasonably well.  2. Prediabetes Her last A1c was 5.8. She is on makeshift Qsymia.  Assessment/Plan:   1. Polyphagia Intensive lifestyle modifications are the first line treatment for this issue. We discussed several lifestyle modifications today and she will continue to work on diet, exercise and weight loss efforts. Orders and follow up as documented in patient record.  Counseling Polyphagia is excessive hunger. Causes can include: low blood sugars, hypERthyroidism, PMS, lack of sleep, stress, insulin resistance, diabetes, certain medications, and diets that are deficient in protein and fiber.   Refill- topiramate (TOPAMAX) 100 MG tablet; Take 1 tablet (100 mg total) by mouth daily.  Dispense: 30 tablet; Refill: 0 Refill- phentermine 15 MG capsule; Take 1 capsule (15 mg total) by mouth every morning.  Dispense: 30 capsule; Refill: 0  2. Prediabetes Mackenzie Watson will continue to work on  weight loss, exercise, and decreasing simple carbohydrates to help decrease the risk of diabetes. Repeat labs in March 2023.  3. Obesity with current BMI of 35.8  Mackenzie Watson is currently in the action stage of change. As such, her goal is to continue with weight loss efforts. She has agreed to the Category 2 Plan + 100 calories and the Pescatarian Plan.   Exercise goals:  Pt may want to start hot yoga.  Behavioral modification strategies: increasing lean protein intake and meal planning and cooking strategies.  Mackenzie Watson has agreed to follow-up with our clinic in 3 weeks. She was informed of the importance of frequent follow-up visits to maximize her success with intensive lifestyle modifications for her multiple health conditions.   Objective:   Blood pressure 119/81, pulse 72, temperature 98.2 F (36.8 C), height 5\' 6"  (1.676 m), weight 221 lb (100.2 kg), SpO2 97 %. Body mass index is 35.67 kg/m.  General: Cooperative, alert, well developed, in no acute distress. HEENT: Conjunctivae and lids unremarkable. Cardiovascular: Regular rhythm.  Lungs: Normal work of breathing. Neurologic: No focal deficits.   Lab Results  Component Value Date   CREATININE 0.99 03/13/2021   BUN 15 03/13/2021   NA 140 03/13/2021   K 3.5 03/13/2021   CL 102 03/13/2021   CO2 24 03/13/2021   Lab Results  Component Value Date   ALT 23 03/13/2021   AST 22 03/13/2021   ALKPHOS 85 03/13/2021   BILITOT 0.3 03/13/2021   Lab Results  Component Value Date   HGBA1C 5.8 (H) 03/13/2021   HGBA1C 5.9 10/16/2020   HGBA1C 5.7 (H)  05/15/2020   HGBA1C 5.7 (H) 12/14/2019   HGBA1C 5.7 (H) 07/13/2019   Lab Results  Component Value Date   INSULIN 12.4 05/15/2020   INSULIN 14.9 12/14/2019   INSULIN 11.2 07/13/2019   INSULIN 17.4 01/26/2019   INSULIN 16.2 10/07/2018   Lab Results  Component Value Date   TSH 1.310 10/07/2018   Lab Results  Component Value Date   CHOL 158 12/14/2019   HDL 48 12/14/2019    LDLCALC 94 12/14/2019   TRIG 82 12/14/2019   Lab Results  Component Value Date   VD25OH 50.1 03/13/2021   VD25OH 34.5 10/16/2020   VD25OH 47.6 05/15/2020   Lab Results  Component Value Date   WBC 5.9 10/07/2018   HGB 13.5 10/07/2018   HCT 42.9 10/07/2018   MCV 81 10/07/2018   Attestation Statements:   Reviewed by clinician on day of visit: allergies, medications, problem list, medical history, surgical history, family history, social history, and previous encounter notes.  Edmund Hilda, CMA, am acting as transcriptionist for Reuben Likes, MD.   I have reviewed the above documentation for accuracy and completeness, and I agree with the above. - Reuben Likes, MD

## 2021-05-15 ENCOUNTER — Other Ambulatory Visit (INDEPENDENT_AMBULATORY_CARE_PROVIDER_SITE_OTHER): Payer: Self-pay | Admitting: Family Medicine

## 2021-05-15 DIAGNOSIS — R632 Polyphagia: Secondary | ICD-10-CM

## 2021-05-15 NOTE — Telephone Encounter (Signed)
LOV w/ Dr. U

## 2021-05-23 ENCOUNTER — Encounter (INDEPENDENT_AMBULATORY_CARE_PROVIDER_SITE_OTHER): Payer: Self-pay

## 2021-05-28 ENCOUNTER — Ambulatory Visit (INDEPENDENT_AMBULATORY_CARE_PROVIDER_SITE_OTHER): Payer: BC Managed Care – PPO | Admitting: Family Medicine

## 2021-05-29 ENCOUNTER — Ambulatory Visit (INDEPENDENT_AMBULATORY_CARE_PROVIDER_SITE_OTHER): Payer: BC Managed Care – PPO | Admitting: Family Medicine

## 2021-05-29 ENCOUNTER — Encounter (INDEPENDENT_AMBULATORY_CARE_PROVIDER_SITE_OTHER): Payer: Self-pay | Admitting: Family Medicine

## 2021-05-29 ENCOUNTER — Other Ambulatory Visit: Payer: Self-pay

## 2021-05-29 VITALS — BP 137/91 | HR 88 | Temp 98.0°F | Ht 66.0 in | Wt 220.0 lb

## 2021-05-29 DIAGNOSIS — I10 Essential (primary) hypertension: Secondary | ICD-10-CM | POA: Diagnosis not present

## 2021-05-29 DIAGNOSIS — E559 Vitamin D deficiency, unspecified: Secondary | ICD-10-CM

## 2021-05-29 DIAGNOSIS — Z6835 Body mass index (BMI) 35.0-35.9, adult: Secondary | ICD-10-CM | POA: Diagnosis not present

## 2021-05-29 DIAGNOSIS — E669 Obesity, unspecified: Secondary | ICD-10-CM | POA: Diagnosis not present

## 2021-05-30 NOTE — Progress Notes (Signed)
Chief Complaint:   OBESITY Mackenzie Watson is here to discuss her progress with her obesity treatment plan along with follow-up of her obesity related diagnoses. Mackenzie Watson is on the Category 2 Plan + 100 calories and states she is following her eating plan approximately 50% of the time. Mackenzie Watson states she is not currently exercising.  Today's visit was #: 40 Starting weight: 249 lbs Starting date: 10/07/2018 Today's weight: 220 lbs Today's date: 05/29/2021 Total lbs lost to date: 29 Total lbs lost since last in-office visit: 1  Interim History: Over the last 3 weeks, pt had COVID with some congestion and sore throat. This was her first time with COVID. She did take Paxlovid. She is still experiencing significant fatigue and congestion. Pt did focus on drinking a significant amount of fluid but wanted comfort foods. She was drinking tea to help with throat pain. She did some soup but did have pizza and wings once. Pt has nothing, except work, planned for the next few weeks.  Subjective:   1. Essential hypertension BP well controlled (diastolic slightly high). Pt denies chest pain/chest pressure/headache.  2. Vitamin D deficiency Pt's Vit D level is 50.1. She reports fatigue, even more so since having COVID.  Assessment/Plan:   1. Essential hypertension Mackenzie Watson is working on healthy weight loss and exercise to improve blood pressure control. We will watch for signs of hypotension as she continues her lifestyle modifications. Continue current treatment plan.  2. Vitamin D deficiency Low Vitamin D level contributes to fatigue and are associated with obesity, breast, and colon cancer. She agrees to continue to take prescription Vitamin D 50,000 IU every week and will follow-up for routine testing of Vitamin D, at least 2-3 times per year to avoid over-replacement. Repeat labs in April.  3. Obesity with current BMI of 35.6 Mackenzie Watson is currently in the action stage of change. As such, her goal is  to continue with weight loss efforts. She has agreed to the Category 2 Plan + 100 calories and the Pescatarian Plan + 100 calories.   Exercise goals: All adults should avoid inactivity. Some physical activity is better than none, and adults who participate in any amount of physical activity gain some health benefits. Pt is to contemplate hot yoga. In 1.5 week, she will add 10-15 minutes of activity.  Behavioral modification strategies: increasing lean protein intake, meal planning and cooking strategies, keeping healthy foods in the home, and planning for success.  Mackenzie Watson has agreed to follow-up with our clinic in 3 weeks. She was informed of the importance of frequent follow-up visits to maximize her success with intensive lifestyle modifications for her multiple health conditions.   Objective:   Blood pressure (!) 137/91, pulse 88, temperature 98 F (36.7 C), height 5\' 6"  (1.676 m), weight 220 lb (99.8 kg), SpO2 97 %. Body mass index is 35.51 kg/m.  General: Cooperative, alert, well developed, in no acute distress. HEENT: Conjunctivae and lids unremarkable. Cardiovascular: Regular rhythm.  Lungs: Normal work of breathing. Neurologic: No focal deficits.   Lab Results  Component Value Date   CREATININE 0.99 03/13/2021   BUN 15 03/13/2021   NA 140 03/13/2021   K 3.5 03/13/2021   CL 102 03/13/2021   CO2 24 03/13/2021   Lab Results  Component Value Date   ALT 23 03/13/2021   AST 22 03/13/2021   ALKPHOS 85 03/13/2021   BILITOT 0.3 03/13/2021   Lab Results  Component Value Date   HGBA1C 5.8 (H) 03/13/2021  HGBA1C 5.9 10/16/2020   HGBA1C 5.7 (H) 05/15/2020   HGBA1C 5.7 (H) 12/14/2019   HGBA1C 5.7 (H) 07/13/2019   Lab Results  Component Value Date   INSULIN 12.4 05/15/2020   INSULIN 14.9 12/14/2019   INSULIN 11.2 07/13/2019   INSULIN 17.4 01/26/2019   INSULIN 16.2 10/07/2018   Lab Results  Component Value Date   TSH 1.310 10/07/2018   Lab Results  Component Value  Date   CHOL 158 12/14/2019   HDL 48 12/14/2019   LDLCALC 94 12/14/2019   TRIG 82 12/14/2019   Lab Results  Component Value Date   VD25OH 50.1 03/13/2021   VD25OH 34.5 10/16/2020   VD25OH 47.6 05/15/2020   Lab Results  Component Value Date   WBC 5.9 10/07/2018   HGB 13.5 10/07/2018   HCT 42.9 10/07/2018   MCV 81 10/07/2018    Attestation Statements:   Reviewed by clinician on day of visit: allergies, medications, problem list, medical history, surgical history, family history, social history, and previous encounter notes.  Edmund Hilda, CMA, am acting as transcriptionist for Reuben Likes, MD.   I have reviewed the above documentation for accuracy and completeness, and I agree with the above. - Reuben Likes, MD

## 2021-06-14 ENCOUNTER — Other Ambulatory Visit (INDEPENDENT_AMBULATORY_CARE_PROVIDER_SITE_OTHER): Payer: Self-pay | Admitting: Family Medicine

## 2021-06-14 ENCOUNTER — Encounter (INDEPENDENT_AMBULATORY_CARE_PROVIDER_SITE_OTHER): Payer: Self-pay

## 2021-06-14 DIAGNOSIS — R632 Polyphagia: Secondary | ICD-10-CM

## 2021-06-14 NOTE — Telephone Encounter (Signed)
Message sent to pt-CAS 

## 2021-06-18 ENCOUNTER — Other Ambulatory Visit (INDEPENDENT_AMBULATORY_CARE_PROVIDER_SITE_OTHER): Payer: Self-pay | Admitting: Family Medicine

## 2021-06-18 DIAGNOSIS — I10 Essential (primary) hypertension: Secondary | ICD-10-CM

## 2021-06-18 NOTE — Telephone Encounter (Signed)
LOV w/ Dr. U

## 2021-06-18 NOTE — Telephone Encounter (Signed)
Waiting for response from pt

## 2021-06-19 ENCOUNTER — Ambulatory Visit (INDEPENDENT_AMBULATORY_CARE_PROVIDER_SITE_OTHER): Payer: BC Managed Care – PPO | Admitting: Family Medicine

## 2021-06-21 ENCOUNTER — Other Ambulatory Visit: Payer: Self-pay

## 2021-06-21 ENCOUNTER — Ambulatory Visit (INDEPENDENT_AMBULATORY_CARE_PROVIDER_SITE_OTHER): Payer: BC Managed Care – PPO | Admitting: Family Medicine

## 2021-06-21 ENCOUNTER — Encounter (INDEPENDENT_AMBULATORY_CARE_PROVIDER_SITE_OTHER): Payer: Self-pay | Admitting: Family Medicine

## 2021-06-21 VITALS — BP 123/78 | HR 76 | Temp 98.4°F | Ht 66.0 in | Wt 219.0 lb

## 2021-06-21 DIAGNOSIS — E559 Vitamin D deficiency, unspecified: Secondary | ICD-10-CM | POA: Diagnosis not present

## 2021-06-21 DIAGNOSIS — I10 Essential (primary) hypertension: Secondary | ICD-10-CM

## 2021-06-21 DIAGNOSIS — R632 Polyphagia: Secondary | ICD-10-CM

## 2021-06-21 DIAGNOSIS — Z9189 Other specified personal risk factors, not elsewhere classified: Secondary | ICD-10-CM

## 2021-06-21 DIAGNOSIS — Z6835 Body mass index (BMI) 35.0-35.9, adult: Secondary | ICD-10-CM

## 2021-06-21 DIAGNOSIS — Z6841 Body Mass Index (BMI) 40.0 and over, adult: Secondary | ICD-10-CM

## 2021-06-21 DIAGNOSIS — E669 Obesity, unspecified: Secondary | ICD-10-CM | POA: Diagnosis not present

## 2021-06-21 MED ORDER — TOPIRAMATE 100 MG PO TABS: 100.0000 mg | ORAL_TABLET | Freq: Every day | ORAL | 0 refills | Status: DC

## 2021-06-21 MED ORDER — PHENTERMINE HCL 15 MG PO CAPS: 15.0000 mg | ORAL_CAPSULE | ORAL | 0 refills | Status: DC

## 2021-06-21 MED ORDER — VITAMIN D (ERGOCALCIFEROL) 1.25 MG (50000 UNIT) PO CAPS: 50000.0000 [IU] | ORAL_CAPSULE | ORAL | 0 refills | Status: DC

## 2021-06-21 MED ORDER — CHLORTHALIDONE 25 MG PO TABS: 12.5000 mg | ORAL_TABLET | Freq: Every day | ORAL | 0 refills | Status: DC

## 2021-06-21 NOTE — Progress Notes (Signed)
Chief Complaint:   OBESITY Mackenzie Watson is here to discuss her progress with her obesity treatment plan along with follow-up of her obesity related diagnoses. Mackenzie Watson is on the Category 2 Plan + 100 calories and the Pescatarian Plan + 100 calories and states she is following her eating plan approximately 80% of the time. Mackenzie Watson states she is walking 30 minutes 2 times per week.  Today's visit was #: 41 Starting weight: 249 lbs Starting date: 10/07/2018 Today's weight: 219 lbs Today's date: 06/21/2021 Total lbs lost to date: 30 Total lbs lost since last in-office visit: 1  Interim History: Since her last appt, pt did take Prednisone for some restrictive airways. She has finished the 9 days of prednisone but is still feeling some COVID fatigue. She has still been doing seafood for lunch with crackers and is still working on water intake. Pt likes the food options she has.   Subjective:   1. Polyphagia Pt is on max dose makeshift Qsymia. She started phentermine 15 mg and topiramate 100 mg in November.  2. Vitamin D deficiency Mackenzie Watson denies nausea, vomiting, and muscle weakness but notes fatigue. She is on prescription Vit D.  3. Essential hypertension BP well controlled. Mackenzie Watson denies chest pain/chest pressure/headache.  4. At risk for activity intolerance Mackenzie Watson is at risk for exercise intolerance due to recent COVID infection.  Assessment/Plan:   1. Polyphagia Intensive lifestyle modifications are the first line treatment for this issue. We discussed several lifestyle modifications today and she will continue to work on diet, exercise and weight loss efforts. Orders and follow up as documented in patient record.  Counseling Polyphagia is excessive hunger. Causes can include: low blood sugars, hypERthyroidism, PMS, lack of sleep, stress, insulin resistance, diabetes, certain medications, and diets that are deficient in protein and fiber.   Refill- phentermine 15 MG capsule;  Take 1 capsule (15 mg total) by mouth every morning.  Dispense: 30 capsule; Refill: 0 Refill- topiramate (TOPAMAX) 100 MG tablet; Take 1 tablet (100 mg total) by mouth daily.  Dispense: 30 tablet; Refill: 0  2. Vitamin D deficiency Low Vitamin D level contributes to fatigue and are associated with obesity, breast, and colon cancer. She agrees to continue to take prescription Vitamin D 50,000 IU every week and will follow-up for routine testing of Vitamin D, at least 2-3 times per year to avoid over-replacement.  Refill- Vitamin D, Ergocalciferol, (DRISDOL) 1.25 MG (50000 UNIT) CAPS capsule; Take 1 capsule (50,000 Units total) by mouth every 7 (seven) days.  Dispense: 4 capsule; Refill: 0  3. Essential hypertension Mackenzie Watson is working on healthy weight loss and exercise to improve blood pressure control. We will watch for signs of hypotension as she continues her lifestyle modifications.  Refill- chlorthalidone (HYGROTON) 25 MG tablet; Take 0.5 tablets (12.5 mg total) by mouth daily.  Dispense: 45 tablet; Refill: 0  4. At risk for activity intolerance Mackenzie Watson was given approximately 15 minutes of exercise intolerance counseling today. She is 51 y.o. female and has risk factors exercise intolerance including obesity. We discussed intensive lifestyle modifications today with an emphasis on specific weight loss instructions and strategies. Mackenzie Watson will slowly increase activity as tolerated.  Repetitive spaced learning was employed today to elicit superior memory formation and behavioral change.  5. Obesity with current BMI of 35.5 Mackenzie Watson is currently in the action stage of change. As such, her goal is to continue with weight loss efforts. She has agreed to the Category 2 Plan + 100 calories and  the Pescatarian Plan + 100 calories.   Exercise goals: All adults should avoid inactivity. Some physical activity is better than none, and adults who participate in any amount of physical activity gain some  health benefits.  Behavioral modification strategies: increasing lean protein intake, meal planning and cooking strategies, and keeping healthy foods in the home.  Mackenzie Watson has agreed to follow-up with our clinic in 3-4 weeks. She was informed of the importance of frequent follow-up visits to maximize her success with intensive lifestyle modifications for her multiple health conditions.   Objective:   Blood pressure 123/78, pulse 76, temperature 98.4 F (36.9 C), height 5\' 6"  (1.676 m), weight 219 lb (99.3 kg), SpO2 99 %. Body mass index is 35.35 kg/m.  General: Cooperative, alert, well developed, in no acute distress. HEENT: Conjunctivae and lids unremarkable. Cardiovascular: Regular rhythm.  Lungs: Normal work of breathing. Neurologic: No focal deficits.   Lab Results  Component Value Date   CREATININE 0.99 03/13/2021   BUN 15 03/13/2021   NA 140 03/13/2021   K 3.5 03/13/2021   CL 102 03/13/2021   CO2 24 03/13/2021   Lab Results  Component Value Date   ALT 23 03/13/2021   AST 22 03/13/2021   ALKPHOS 85 03/13/2021   BILITOT 0.3 03/13/2021   Lab Results  Component Value Date   HGBA1C 5.8 (H) 03/13/2021   HGBA1C 5.9 10/16/2020   HGBA1C 5.7 (H) 05/15/2020   HGBA1C 5.7 (H) 12/14/2019   HGBA1C 5.7 (H) 07/13/2019   Lab Results  Component Value Date   INSULIN 12.4 05/15/2020   INSULIN 14.9 12/14/2019   INSULIN 11.2 07/13/2019   INSULIN 17.4 01/26/2019   INSULIN 16.2 10/07/2018   Lab Results  Component Value Date   TSH 1.310 10/07/2018   Lab Results  Component Value Date   CHOL 158 12/14/2019   HDL 48 12/14/2019   LDLCALC 94 12/14/2019   TRIG 82 12/14/2019   Lab Results  Component Value Date   VD25OH 50.1 03/13/2021   VD25OH 34.5 10/16/2020   VD25OH 47.6 05/15/2020   Lab Results  Component Value Date   WBC 5.9 10/07/2018   HGB 13.5 10/07/2018   HCT 42.9 10/07/2018   MCV 81 10/07/2018    Attestation Statements:   Reviewed by clinician on day of  visit: allergies, medications, problem list, medical history, surgical history, family history, social history, and previous encounter notes.  12/07/2018, CMA, am acting as transcriptionist for Edmund Hilda, MD.  I have reviewed the above documentation for accuracy and completeness, and I agree with the above. - Reuben Likes, MD

## 2021-07-09 ENCOUNTER — Ambulatory Visit (INDEPENDENT_AMBULATORY_CARE_PROVIDER_SITE_OTHER): Payer: BC Managed Care – PPO | Admitting: Family Medicine

## 2021-07-09 ENCOUNTER — Encounter (INDEPENDENT_AMBULATORY_CARE_PROVIDER_SITE_OTHER): Payer: Self-pay | Admitting: Family Medicine

## 2021-07-09 ENCOUNTER — Other Ambulatory Visit: Payer: Self-pay

## 2021-07-09 VITALS — BP 135/79 | HR 85 | Temp 98.0°F | Ht 66.0 in | Wt 218.0 lb

## 2021-07-09 DIAGNOSIS — E669 Obesity, unspecified: Secondary | ICD-10-CM

## 2021-07-09 DIAGNOSIS — E559 Vitamin D deficiency, unspecified: Secondary | ICD-10-CM

## 2021-07-09 DIAGNOSIS — Z6841 Body Mass Index (BMI) 40.0 and over, adult: Secondary | ICD-10-CM

## 2021-07-09 DIAGNOSIS — R632 Polyphagia: Secondary | ICD-10-CM

## 2021-07-09 DIAGNOSIS — Z9189 Other specified personal risk factors, not elsewhere classified: Secondary | ICD-10-CM

## 2021-07-09 DIAGNOSIS — Z6835 Body mass index (BMI) 35.0-35.9, adult: Secondary | ICD-10-CM | POA: Diagnosis not present

## 2021-07-09 MED ORDER — VITAMIN D (ERGOCALCIFEROL) 1.25 MG (50000 UNIT) PO CAPS: 50000.0000 [IU] | ORAL_CAPSULE | ORAL | 0 refills | Status: DC

## 2021-07-09 MED ORDER — WEGOVY 0.25 MG/0.5ML ~~LOC~~ SOAJ: 0.2500 mg | SUBCUTANEOUS | 0 refills | Status: DC

## 2021-07-10 ENCOUNTER — Encounter (INDEPENDENT_AMBULATORY_CARE_PROVIDER_SITE_OTHER): Payer: Self-pay | Admitting: Family Medicine

## 2021-07-10 NOTE — Progress Notes (Unsigned)
Chief Complaint:   OBESITY Mackenzie Watson is here to discuss her progress with her obesity treatment plan along with follow-up of her obesity related diagnoses. Mackenzie Watson is on the Category 2 Plan plus 100 calories and the [place] plus 100 calories and states she is following her eating plan approximately 75-80% of the time. Mackenzie Watson states she is walking 30 minutes 2-3 times per week.  Today's visit was #: 85 Starting weight: 249 lbs Starting date: 10/07/2018 Today's weight: 218 lbs Today's date: 07/09/2021 Total lbs lost to date: 31 lbs Total lbs lost since last in-office visit: 1 lb  Interim History: Deziray is still staying as focused as she can be with drama at work. She is still looking for another job. She has done well adhering as much as she can to the meal plan and just trying to stay focused. She sometimes skip meals due to stress. She notes no upcoming plans until Easter. She is saving up time.   Subjective:   1. Polyphagia Mackenzie Watson is currently taking Phentermine and Topamax. Her weight is neutral for 3 months on max dose.   2. Vitamin D deficiency Mackenzie Watson is on prescription Vitamin D level of 50,000 IU.   3. At risk for side effect of medication Mackenzie Watson is at risk for side effect of medication due to the start of Wegovy.   Assessment/Plan:   1. Polyphagia Intensive lifestyle modifications are the first line treatment for this issue. Mackenzie Watson will decrease Phentermine and Topamax to 1/2 dose daily times 1 week, then 1/2 doses every other day times 1 week. We discussed several lifestyle modifications today and she will continue to work on diet, exercise and weight loss efforts. Orders and follow up as documented in patient record.  Counseling Polyphagia is excessive hunger. Causes can include: low blood sugars, hypERthyroidism, PMS, lack of sleep, stress, insulin resistance, diabetes, certain medications, and diets that are deficient in protein and fiber.    2. Vitamin  D deficiency Low Vitamin D level contributes to fatigue and are associated with obesity, breast, and colon cancer. We will refill prescription Vitamin D 50,000 IU every week for 1 month with no refills and Mackenzie Watson will follow-up for routine testing of Vitamin D, at least 2-3 times per year to avoid over-replacement.  - Vitamin D, Ergocalciferol, (DRISDOL) 1.25 MG (50000 UNIT) CAPS capsule; Take 1 capsule (50,000 Units total) by mouth every 7 (seven) days.  Dispense: 4 capsule; Refill: 0  3. At risk for side effect of medication Mackenzie Watson was given approximately 15 minutes of drug side effect counseling today.  We discussed side effect possibility and risk versus benefits. Mackenzie Watson agreed to the medication and will contact this office if these side effects are intolerable.  Repetitive spaced learning was employed today to elicit superior memory formation and behavioral change.   4. Obesity with current BMI of 35.3 Mackenzie Watson is currently in the action stage of change. As such, her goal is to continue with weight loss efforts. She has agreed to the Category 2 Plan plus 100 calories and the Pescatarian Plan plus 100 calories.   Mackenzie Watson agrees to start Wegovy 0.25 mg subcutaneous weekly for 1 month with no refills.   - Semaglutide-Weight Management (WEGOVY) 0.25 MG/0.5ML SOAJ; Inject 0.25 mg into the skin once a week.  Dispense: 2 mL; Refill: 0  Exercise goals: All adults should avoid inactivity. Some physical activity is better than none, and adults who participate in any amount of physical activity gain some health  benefits.  Behavioral modification strategies: increasing lean protein intake, meal planning and cooking strategies, keeping healthy foods in the home, and planning for success.  Mackenzie Watson has agreed to follow-up with our clinic in 3 weeks (fasting). She was informed of the importance of frequent follow-up visits to maximize her success with intensive lifestyle modifications for her multiple  health conditions.   Objective:   Blood pressure 135/79, pulse 85, temperature 98 F (36.7 C), height 5\' 6"  (1.676 m), weight 218 lb (98.9 kg), SpO2 99 %. Body mass index is 35.19 kg/m.  General: Cooperative, alert, well developed, in no acute distress. HEENT: Conjunctivae and lids unremarkable. Cardiovascular: Regular rhythm.  Lungs: Normal work of breathing. Neurologic: No focal deficits.   Lab Results  Component Value Date   CREATININE 0.99 03/13/2021   BUN 15 03/13/2021   NA 140 03/13/2021   K 3.5 03/13/2021   CL 102 03/13/2021   CO2 24 03/13/2021   Lab Results  Component Value Date   ALT 23 03/13/2021   AST 22 03/13/2021   ALKPHOS 85 03/13/2021   BILITOT 0.3 03/13/2021   Lab Results  Component Value Date   HGBA1C 5.8 (H) 03/13/2021   HGBA1C 5.9 10/16/2020   HGBA1C 5.7 (H) 05/15/2020   HGBA1C 5.7 (H) 12/14/2019   HGBA1C 5.7 (H) 07/13/2019   Lab Results  Component Value Date   INSULIN 12.4 05/15/2020   INSULIN 14.9 12/14/2019   INSULIN 11.2 07/13/2019   INSULIN 17.4 01/26/2019   INSULIN 16.2 10/07/2018   Lab Results  Component Value Date   TSH 1.310 10/07/2018   Lab Results  Component Value Date   CHOL 158 12/14/2019   HDL 48 12/14/2019   LDLCALC 94 12/14/2019   TRIG 82 12/14/2019   Lab Results  Component Value Date   VD25OH 50.1 03/13/2021   VD25OH 34.5 10/16/2020   VD25OH 47.6 05/15/2020   Lab Results  Component Value Date   WBC 5.9 10/07/2018   HGB 13.5 10/07/2018   HCT 42.9 10/07/2018   MCV 81 10/07/2018   No results found for: IRON, TIBC, FERRITIN  Attestation Statements:   Reviewed by clinician on day of visit: allergies, medications, problem list, medical history, surgical history, family history, social history, and previous encounter notes.  I, Lizbeth Bark, RMA, am acting as transcriptionist for Coralie Common, MD.  I have reviewed the above documentation for accuracy and completeness, and I agree with the above. -  ***

## 2021-07-10 NOTE — Telephone Encounter (Signed)
Please advise 

## 2021-07-11 ENCOUNTER — Encounter (INDEPENDENT_AMBULATORY_CARE_PROVIDER_SITE_OTHER): Payer: Self-pay

## 2021-07-11 MED ORDER — PHENTERMINE HCL 8 MG PO TABS: 8.0000 mg | ORAL_TABLET | Freq: Every day | ORAL | 0 refills | Status: DC

## 2021-07-12 ENCOUNTER — Encounter (INDEPENDENT_AMBULATORY_CARE_PROVIDER_SITE_OTHER): Payer: Self-pay | Admitting: Family Medicine

## 2021-07-12 NOTE — Telephone Encounter (Signed)
Please advise 

## 2021-07-18 ENCOUNTER — Other Ambulatory Visit (INDEPENDENT_AMBULATORY_CARE_PROVIDER_SITE_OTHER): Payer: Self-pay | Admitting: Family Medicine

## 2021-07-18 DIAGNOSIS — R632 Polyphagia: Secondary | ICD-10-CM

## 2021-08-06 ENCOUNTER — Encounter (INDEPENDENT_AMBULATORY_CARE_PROVIDER_SITE_OTHER): Payer: Self-pay | Admitting: Family Medicine

## 2021-08-06 ENCOUNTER — Ambulatory Visit (INDEPENDENT_AMBULATORY_CARE_PROVIDER_SITE_OTHER): Payer: BC Managed Care – PPO | Admitting: Family Medicine

## 2021-08-06 VITALS — BP 154/84 | HR 92 | Temp 97.5°F | Ht 66.0 in | Wt 221.0 lb

## 2021-08-06 DIAGNOSIS — E669 Obesity, unspecified: Secondary | ICD-10-CM

## 2021-08-06 DIAGNOSIS — E559 Vitamin D deficiency, unspecified: Secondary | ICD-10-CM

## 2021-08-06 DIAGNOSIS — I10 Essential (primary) hypertension: Secondary | ICD-10-CM | POA: Diagnosis not present

## 2021-08-06 DIAGNOSIS — R7303 Prediabetes: Secondary | ICD-10-CM | POA: Diagnosis not present

## 2021-08-06 DIAGNOSIS — Z6835 Body mass index (BMI) 35.0-35.9, adult: Secondary | ICD-10-CM

## 2021-08-06 MED ORDER — WEGOVY 0.25 MG/0.5ML ~~LOC~~ SOAJ: 0.2500 mg | SUBCUTANEOUS | 0 refills | Status: DC

## 2021-08-07 LAB — COMPREHENSIVE METABOLIC PANEL
ALT: 26 IU/L (ref 0–32)
AST: 21 IU/L (ref 0–40)
Albumin/Globulin Ratio: 1.5 (ref 1.2–2.2)
Albumin: 4.4 g/dL (ref 3.8–4.8)
Alkaline Phosphatase: 80 IU/L (ref 44–121)
BUN/Creatinine Ratio: 11 (ref 9–23)
BUN: 10 mg/dL (ref 6–24)
Bilirubin Total: 0.4 mg/dL (ref 0.0–1.2)
CO2: 26 mmol/L (ref 20–29)
Calcium: 9.9 mg/dL (ref 8.7–10.2)
Chloride: 105 mmol/L (ref 96–106)
Creatinine, Ser: 0.94 mg/dL (ref 0.57–1.00)
Globulin, Total: 2.9 g/dL (ref 1.5–4.5)
Glucose: 86 mg/dL (ref 70–99)
Potassium: 4.1 mmol/L (ref 3.5–5.2)
Sodium: 146 mmol/L — ABNORMAL HIGH (ref 134–144)
Total Protein: 7.3 g/dL (ref 6.0–8.5)
eGFR: 74 mL/min/{1.73_m2} (ref >59)

## 2021-08-07 LAB — INSULIN, RANDOM: INSULIN: 16.3 u[IU]/mL (ref 2.6–24.9)

## 2021-08-07 LAB — HEMOGLOBIN A1C
Est. average glucose Bld gHb Est-mCnc: 120 mg/dL
Hgb A1c MFr Bld: 5.8 % — ABNORMAL HIGH (ref 4.8–5.6)

## 2021-08-07 LAB — VITAMIN D 25 HYDROXY (VIT D DEFICIENCY, FRACTURES): Vit D, 25-Hydroxy: 61.7 ng/mL (ref 30.0–100.0)

## 2021-08-08 NOTE — Progress Notes (Signed)
? ? ? ?Chief Complaint:  ? ?OBESITY ?Mackenzie Watson is here to discuss her progress with her obesity treatment plan along with follow-up of her obesity related diagnoses. Mackenzie Watson is on the [employer] and states she is following her eating plan approximately 75% of the time. Mackenzie Watson states she is walking 30 minutes 2-3 times per week. ? ?Today's visit was #: 21 ?Starting weight: 249 lbs ?Starting date: 10/07/2018 ?Today's weight: 221 lbs ?Today's date: 08/06/2021 ?Total lbs lost to date: 55 ?Total lbs lost since last in-office visit: 0 ? ?Interim History: Mackenzie Watson stopped combo Phent/Topiramate due to less then 5% weight loss and started [employer]. She noticed a decrease in appetite and is still working on getting all food in. Mackenzie Watson is working toward [employer] out, so there isn't as much food that she would have to take in during the evenings. With after school activities, it has been more difficult to get all desired exercise in. ? ?Subjective:  ? ?1. Prediabetes ?Mackenzie Watson's last A1c at 5.8, insulin at 12.4 ( 2 sperate lab times). She is on a GLP-1. ? ?2. Vitamin D deficiency ?Mackenzie Watson is currently on prescription Vit D, 50k, weekly. Her last Vit D level of 50.1 ? ?3. Essential hypertension ?Mackenzie Watson's blood pressure is elevated today. She is currently on Chlorthalidone. Denies chest pain, chest pressure and headache. ? ?Assessment/Plan:  ? ?1. Prediabetes ?We will check Mackenzie Watson's, A1c, Insulin and CMP. ?- Hemoglobin A1c ?- Insulin, random ? ?2. Vitamin D deficiency ?We will check Vit D level today. ?- VITAMIN D 25 Hydroxy (Vit-D Deficiency, Fractures) ? ?3. Essential hypertension ?We will check Mackenzie Watson's CMP today. ?- Comprehensive metabolic panel ? ?4. Obesity with current BMI of 35.7 ?We will refill Wegovy 0.25mg  for 1 month with no refills. ? ?-Refill Semaglutide-Weight Management (WEGOVY) 0.25 MG/0.5ML SOAJ; Inject 0.25 mg into the skin once a week.  Dispense: 2 mL; Refill: 0 ? ?Mackenzie Watson is currently in the action  stage of change. As such, her goal is to continue with weight loss efforts. She has agreed to the [employer].  ? ?Exercise goals: All adults should avoid inactivity. Some physical activity is better than none, and adults who participate in any amount of physical activity gain some health benefits. ? ?Behavioral modification strategies: increasing lean protein intake, meal planning and cooking strategies, and keeping healthy foods in the home. ? ?Mackenzie Watson has agreed to follow-up with our clinic in 3 weeks. She was informed of the importance of frequent follow-up visits to maximize her success with intensive lifestyle modifications for her multiple health conditions.  ? ?Objective:  ? ?Blood pressure (!) 154/84, pulse 92, temperature (!) 97.5 ?F (36.4 ?C), height 5\' 6"  (1.676 m), weight 221 lb (100.2 kg), SpO2 99 %. ?Body mass index is 35.67 kg/m?. ? ?General: Cooperative, alert, well developed, in no acute distress. ?HEENT: Conjunctivae and lids unremarkable. ?Cardiovascular: Regular rhythm.  ?Lungs: Normal work of breathing. ?Neurologic: No focal deficits.  ? ?Lab Results  ?Component Value Date  ? CREATININE 0.94 08/06/2021  ? BUN 10 08/06/2021  ? NA 146 (H) 08/06/2021  ? K 4.1 08/06/2021  ? CL 105 08/06/2021  ? CO2 26 08/06/2021  ? ?Lab Results  ?Component Value Date  ? ALT 26 08/06/2021  ? AST 21 08/06/2021  ? ALKPHOS 80 08/06/2021  ? BILITOT 0.4 08/06/2021  ? ?Lab Results  ?Component Value Date  ? HGBA1C 5.8 (H) 08/06/2021  ? HGBA1C 5.8 (H) 03/13/2021  ? HGBA1C 5.9 10/16/2020  ? HGBA1C 5.7 (H)  05/15/2020  ? HGBA1C 5.7 (H) 12/14/2019  ? ?Lab Results  ?Component Value Date  ? INSULIN 16.3 08/06/2021  ? INSULIN 12.4 05/15/2020  ? INSULIN 14.9 12/14/2019  ? INSULIN 11.2 07/13/2019  ? INSULIN 17.4 01/26/2019  ? ?Lab Results  ?Component Value Date  ? TSH 1.310 10/07/2018  ? ?Lab Results  ?Component Value Date  ? CHOL 158 12/14/2019  ? HDL 48 12/14/2019  ? [place] 94 12/14/2019  ? TRIG 82 12/14/2019  ? ?Lab Results   ?Component Value Date  ? VD25OH 61.7 08/06/2021  ? VD25OH 50.1 03/13/2021  ? VD25OH 34.5 10/16/2020  ? ?Lab Results  ?Component Value Date  ? WBC 5.9 10/07/2018  ? HGB 13.5 10/07/2018  ? HCT 42.9 10/07/2018  ? MCV 81 10/07/2018  ? ?No results found for: IRON, TIBC, FERRITIN ? ?Attestation Statements:  ? ?Reviewed by clinician on day of visit: allergies, medications, problem list, medical history, surgical history, family history, social history, and previous encounter notes. ? ?I, Mackenzie Watson, am acting as transcriptionist for Mackenzie Common, MD. ? ?I have reviewed the above documentation for accuracy and completeness, and I agree with the above. Mackenzie Common, MD ? ?

## 2021-08-28 ENCOUNTER — Ambulatory Visit (INDEPENDENT_AMBULATORY_CARE_PROVIDER_SITE_OTHER): Payer: BC Managed Care – PPO | Admitting: Family Medicine

## 2021-08-28 ENCOUNTER — Encounter (INDEPENDENT_AMBULATORY_CARE_PROVIDER_SITE_OTHER): Payer: Self-pay | Admitting: Family Medicine

## 2021-08-28 VITALS — BP 137/88 | HR 87 | Temp 98.0°F | Ht 66.0 in | Wt 227.0 lb

## 2021-08-28 DIAGNOSIS — Z6836 Body mass index (BMI) 36.0-36.9, adult: Secondary | ICD-10-CM

## 2021-08-28 DIAGNOSIS — I10 Essential (primary) hypertension: Secondary | ICD-10-CM

## 2021-08-28 DIAGNOSIS — E559 Vitamin D deficiency, unspecified: Secondary | ICD-10-CM | POA: Diagnosis not present

## 2021-08-28 DIAGNOSIS — E669 Obesity, unspecified: Secondary | ICD-10-CM

## 2021-08-28 MED ORDER — VITAMIN D3 125 MCG (5000 UT) PO CAPS: 5000.0000 [IU] | ORAL_CAPSULE | Freq: Every day | ORAL | Status: AC

## 2021-08-28 MED ORDER — WEGOVY 0.5 MG/0.5ML ~~LOC~~ SOAJ: 0.5000 mg | SUBCUTANEOUS | 0 refills | Status: DC

## 2021-08-28 MED ORDER — CHLORTHALIDONE 25 MG PO TABS: 12.5000 mg | ORAL_TABLET | Freq: Every day | ORAL | 0 refills | Status: DC

## 2021-09-07 NOTE — Progress Notes (Signed)
Chief Complaint:   OBESITY Mackenzie Watson is here to discuss her progress with her obesity treatment plan along with follow-up of her obesity related diagnoses. Mackenzie Watson is on the [employer] and states she is following her eating plan approximately 50% of the time. Mackenzie Watson states she is walking 30-45 minutes 2-3 times per week.  Today's visit was #: 8 Starting weight: 249 lbs Starting date: 10/07/2018 Today's weight: 227 lbs Today's date: 08/28/2021 Total lbs lost to date: 22 lbs Total lbs lost since last in-office visit: 0  Interim History: Mackenzie Watson went out of town over spring break, feels like this was a breath of fresh air and feels reinvigorated.  [place] to [place] the [place]. Her appetite has increased, so she has eaten more.  She has gotten back into meal prep to stay more on track.   Subjective:   1. Vitamin D deficiency Mackenzie Watson's last vitamin d level was 61.7. Mackenzie Watson denies any nausea, vomiting, or muscles weakness, but complains of fatigue.    2. Essential hypertension Mackenzie Watson's blood pressure today was well controlled today.  Mackenzie Watson denies any chest pain, chest pressure or headaches.  Assessment/Plan:   1. Vitamin D deficiency Mackenzie Watson has agreed to stop prescription vitamin d and start OTC vitamin d 5,000 IU daily.  We will check labs again in 3-4 months.   - Cholecalciferol (VITAMIN D3) 125 MCG (5000 UT) CAPS; Take 1 capsule (5,000 Units total) by mouth daily.  Dispense: 30 capsule  2. Essential hypertension Mackenzie Watson has agreed to continue chlorthalidone, we will refill today.  See below.   - chlorthalidone (HYGROTON) 25 MG tablet; Take 0.5 tablets (12.5 mg total) by mouth daily.  Dispense: 45 tablet; Refill: 0  3. Obesity with current BMI of 36.7 Mackenzie Watson has agreed to continue [employer].  We will refill today.  See below.  - Semaglutide-Weight Management (WEGOVY) 0.5 MG/0.5ML SOAJ; Inject 0.5 mg into the skin once a week.  Dispense: 2 mL; Refill: 0  Mackenzie Watson is  currently in the action stage of change. As such, her goal is to continue with weight loss efforts. She has agreed to the [employer].   Exercise goals:  Mackenzie Watson wants to increase her activity, but is limited I time so is trying to schedule activity more frequently.  Behavioral modification strategies: increasing lean protein intake, meal planning and cooking strategies, keeping healthy foods in the home, and avoiding temptations.  Mackenzie Watson has agreed to follow-up with our clinic in 4 weeks. She was informed of the importance of frequent follow-up visits to maximize her success with intensive lifestyle modifications for her multiple health conditions.  Objective:   Blood pressure 137/88, pulse 87, temperature 98 F (36.7 C), height 5\' 6"  (1.676 m), weight 227 lb (103 kg), SpO2 100 %. Body mass index is 36.64 kg/m.  General: Cooperative, alert, well developed, in no acute distress. HEENT: Conjunctivae and lids unremarkable. Cardiovascular: Regular rhythm.  Lungs: Normal work of breathing. Neurologic: No focal deficits.   Lab Results  Component Value Date   CREATININE 0.94 08/06/2021   BUN 10 08/06/2021   NA 146 (H) 08/06/2021   K 4.1 08/06/2021   CL 105 08/06/2021   CO2 26 08/06/2021   Lab Results  Component Value Date   ALT 26 08/06/2021   AST 21 08/06/2021   ALKPHOS 80 08/06/2021   BILITOT 0.4 08/06/2021   Lab Results  Component Value Date   HGBA1C 5.8 (H) 08/06/2021   HGBA1C 5.8 (H) 03/13/2021  HGBA1C 5.9 10/16/2020   HGBA1C 5.7 (H) 05/15/2020   HGBA1C 5.7 (H) 12/14/2019   Lab Results  Component Value Date   INSULIN 16.3 08/06/2021   INSULIN 12.4 05/15/2020   INSULIN 14.9 12/14/2019   INSULIN 11.2 07/13/2019   INSULIN 17.4 01/26/2019   Lab Results  Component Value Date   TSH 1.310 10/07/2018   Lab Results  Component Value Date   CHOL 158 12/14/2019   HDL 48 12/14/2019   LDLCALC 94 12/14/2019   TRIG 82 12/14/2019   Lab Results  Component Value  Date   VD25OH 61.7 08/06/2021   VD25OH 50.1 03/13/2021   VD25OH 34.5 10/16/2020   Lab Results  Component Value Date   WBC 5.9 10/07/2018   HGB 13.5 10/07/2018   HCT 42.9 10/07/2018   MCV 81 10/07/2018   No results found for: IRON, TIBC, FERRITIN Attestation Statements:   Reviewed by clinician on day of visit: allergies, medications, problem list, medical history, surgical history, family history, social history, and previous encounter notes.  I, Davy Pique, RMA, am acting as transcriptionist for Coralie Common, MD.  I have reviewed the above documentation for accuracy and completeness, and I agree with the above. - Coralie Common, MD

## 2021-09-18 ENCOUNTER — Ambulatory Visit (INDEPENDENT_AMBULATORY_CARE_PROVIDER_SITE_OTHER): Payer: BC Managed Care – PPO | Admitting: Family Medicine

## 2021-09-18 ENCOUNTER — Encounter (INDEPENDENT_AMBULATORY_CARE_PROVIDER_SITE_OTHER): Payer: Self-pay | Admitting: Family Medicine

## 2021-09-18 VITALS — BP 123/83 | HR 82 | Temp 97.6°F | Ht 66.0 in | Wt 223.0 lb

## 2021-09-18 DIAGNOSIS — R7303 Prediabetes: Secondary | ICD-10-CM

## 2021-09-18 DIAGNOSIS — Z6836 Body mass index (BMI) 36.0-36.9, adult: Secondary | ICD-10-CM

## 2021-09-18 DIAGNOSIS — I10 Essential (primary) hypertension: Secondary | ICD-10-CM | POA: Diagnosis not present

## 2021-09-18 DIAGNOSIS — E669 Obesity, unspecified: Secondary | ICD-10-CM

## 2021-09-18 MED ORDER — WEGOVY 1 MG/0.5ML ~~LOC~~ SOAJ: 1.0000 mg | SUBCUTANEOUS | 0 refills | Status: DC

## 2021-09-24 NOTE — Progress Notes (Signed)
Chief Complaint:   OBESITY Mackenzie Watson is here to discuss her progress with her obesity treatment plan along with follow-up of her obesity related diagnoses. Mackenzie Watson is on the [employer] and states she is following her eating plan approximately 50% of the time. Mackenzie Watson states she is walking for 30-60 minutes 3 times per week.  Today's visit was #: 45 Starting weight: 249 lbs Starting date: 10/07/2018 Today's weight: 223 lbs Today's date: 09/18/2021 Total lbs lost to date: 26 lbs Total lbs lost since last in-office visit: 4 lbs  Interim History: Mackenzie Watson has been just trying to get thru the next few weeks at school. 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 She has done some extra walking. She is still trying to find a good time to start hot yoga. She has been more hungry than previously and adding more protein to meals.   Subjective:   1. Prediabetes Mackenzie Watson's last A1C was 5.8. Insulin was 16.3. She is on GLP-1 with good satiety previously.   2. Essential hypertension Mackenzie Watson's blood pressure is controlled today 123/83. She denies chest pain, chest pressure, and headaches. She is on chlorthalidone at 12.5 mg now.  Assessment/Plan:   1. Prediabetes Mackenzie Watson agrees to increase GLP-1. She will continue to work on weight loss, exercise, and decreasing simple carbohydrates to help decrease the risk of diabetes.   2. Essential hypertension Mackenzie Watson will continue chlorthalidone. No change in dose. She will continue working on healthy weight loss and exercise to improve blood pressure control. We will watch for signs of hypotension as she continues her lifestyle modifications.  3. Obesity with current BMI of 36.1 Mackenzie Watson is currently in the action stage of change. As such, her goal is to continue with weight loss efforts. She has agreed to the [employer] plus 2 ounces of protein.   Mackenzie Watson agrees to [employer] 1 mg subcutaneous weekly for 1 month with no  refills.   - Semaglutide-Weight Management (WEGOVY) 1 MG/0.5ML SOAJ; Inject 1 mg into the skin once a week.  Dispense: 2 mL; Refill: 0  Exercise goals: All adults should avoid inactivity. Some physical activity is better than none, and adults who participate in any amount of physical activity gain some health benefits. Mackenzie Watson wants to start hot yoga.   Behavioral modification strategies: increasing lean protein intake, meal planning and cooking strategies, keeping healthy foods in the home, and planning for success.  Mackenzie Watson has agreed to follow-up with our clinic in 3 weeks. She was informed of the importance of frequent follow-up visits to maximize her success with intensive lifestyle modifications for her multiple health conditions.   Objective:   Blood pressure 123/83, pulse 82, temperature 97.6 F (36.4 C), height 5\' 6"  (1.676 m), weight 223 lb (101.2 kg), SpO2 99 %. Body mass index is 35.99 kg/m.  General: Cooperative, alert, well developed, in no acute distress. HEENT: Conjunctivae and lids unremarkable. Cardiovascular: Regular rhythm.  Lungs: Normal work of breathing. Neurologic: No focal deficits.   Lab Results  Component Value Date   CREATININE 0.94 08/06/2021   BUN 10 08/06/2021   NA 146 (H) 08/06/2021   K 4.1 08/06/2021   CL 105 08/06/2021   CO2 26 08/06/2021   Lab Results  Component Value Date   ALT 26 08/06/2021   AST 21 08/06/2021   ALKPHOS 80 08/06/2021   BILITOT 0.4 08/06/2021   Lab Results  Component Value Date   HGBA1C 5.8 (H) 08/06/2021  HGBA1C 5.8 (H) 03/13/2021   HGBA1C 5.9 10/16/2020   HGBA1C 5.7 (H) 05/15/2020   HGBA1C 5.7 (H) 12/14/2019   Lab Results  Component Value Date   INSULIN 16.3 08/06/2021   INSULIN 12.4 05/15/2020   INSULIN 14.9 12/14/2019   INSULIN 11.2 07/13/2019   INSULIN 17.4 01/26/2019   Lab Results  Component Value Date   TSH 1.310 10/07/2018   Lab Results  Component Value Date   CHOL 158 12/14/2019   HDL 48  12/14/2019   LDLCALC 94 12/14/2019   TRIG 82 12/14/2019   Lab Results  Component Value Date   VD25OH 61.7 08/06/2021   VD25OH 50.1 03/13/2021   VD25OH 34.5 10/16/2020   Lab Results  Component Value Date   WBC 5.9 10/07/2018   HGB 13.5 10/07/2018   HCT 42.9 10/07/2018   MCV 81 10/07/2018   No results found for: IRON, TIBC, FERRITIN  Attestation Statements:   Reviewed by clinician on day of visit: allergies, medications, problem list, medical history, surgical history, family history, social history, and previous encounter notes.  I, Jackson Latino, RMA, am acting as transcriptionist for Reuben Likes, MD.   I have reviewed the above documentation for accuracy and completeness, and I agree with the above. - Reuben Likes, MD

## 2021-10-15 ENCOUNTER — Ambulatory Visit (INDEPENDENT_AMBULATORY_CARE_PROVIDER_SITE_OTHER): Payer: BC Managed Care – PPO | Admitting: Family Medicine

## 2021-10-15 ENCOUNTER — Encounter (INDEPENDENT_AMBULATORY_CARE_PROVIDER_SITE_OTHER): Payer: Self-pay | Admitting: Family Medicine

## 2021-10-15 ENCOUNTER — Other Ambulatory Visit (INDEPENDENT_AMBULATORY_CARE_PROVIDER_SITE_OTHER): Payer: Self-pay | Admitting: Family Medicine

## 2021-10-15 VITALS — BP 136/86 | HR 93 | Temp 98.0°F | Ht 66.0 in | Wt 221.0 lb

## 2021-10-15 DIAGNOSIS — E669 Obesity, unspecified: Secondary | ICD-10-CM

## 2021-10-15 DIAGNOSIS — R0602 Shortness of breath: Secondary | ICD-10-CM

## 2021-10-15 DIAGNOSIS — Z6835 Body mass index (BMI) 35.0-35.9, adult: Secondary | ICD-10-CM

## 2021-10-15 DIAGNOSIS — E66813 Obesity, class 3: Secondary | ICD-10-CM

## 2021-10-15 DIAGNOSIS — R7303 Prediabetes: Secondary | ICD-10-CM

## 2021-10-15 MED ORDER — WEGOVY 1 MG/0.5ML ~~LOC~~ SOAJ: 1.0000 mg | SUBCUTANEOUS | 0 refills | Status: DC

## 2021-10-15 MED ORDER — WEGOVY 1.7 MG/0.75ML ~~LOC~~ SOAJ
1.7000 mg | SUBCUTANEOUS | 0 refills | Status: DC
  Filled 2021-10-18: qty 3, 28d supply, fill #0

## 2021-10-15 NOTE — Telephone Encounter (Signed)
Dr.Ukleja 

## 2021-10-17 ENCOUNTER — Encounter (INDEPENDENT_AMBULATORY_CARE_PROVIDER_SITE_OTHER): Payer: Self-pay | Admitting: Family Medicine

## 2021-10-17 NOTE — Progress Notes (Signed)
Chief Complaint:   OBESITY Mackenzie Watson is here to discuss her progress with her obesity treatment plan along with follow-up of her obesity related diagnoses. Mackenzie Watson is on the [employer] + 2 oz and states she is following her eating plan approximately 75% of the time. Mackenzie Watson states she is walking 20-30 minutes 2 times per week.  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 She wants to use the summer to focus on herself. 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 Now that school is out, she plans to walk daily and add hot yoga 1-2 times a week.  Subjective:   1. SOB (shortness of breath) Pari's IC initially 1181. Symptoms have improved since first IC.  2. Prediabetes Laquashia is currently on GLP-1. Her last A1c at 5.8, insulin at 16.3.  Assessment/Plan:   1. SOB (shortness of breath) IC performed today-1771. We will increase Pescatarian with additionally 200-300 calories.  2. Prediabetes Will obtain labs in September.  3. Obesity with current BMI of 35.7 We will refill Wegovy 1 mg subcutaneous once weekly. (May need to go up to 1.7 mg if not available).  -Refill Semaglutide-Weight Management (WEGOVY) 1.7 MG/0.75ML SOAJ; Inject 1.7 mg into the skin once a week.  Dispense: 3 mL; Refill: 0  Mackenzie Watson is currently in the action stage of change. As such, her goal is to continue with weight loss efforts. She has agreed to the [employer] +2oz,+ 200 calories  Exercise goals: All adults should avoid inactivity. Some physical activity is better than none, and adults who participate in any amount of physical activity gain some health benefits.  Mackenzie Watson will add in activity besides  walking 2 times a week.  Behavioral modification strategies: increasing lean protein intake, meal planning and cooking strategies, keeping healthy foods in the home, and planning for success.  Mackenzie Watson has agreed to follow-up with our clinic in 4 weeks. She was informed of the importance of frequent follow-up visits to maximize her success with intensive lifestyle modifications for her multiple health conditions.   Objective:   Blood pressure 136/86, pulse 93, temperature 98 F (36.7 C), height 5\' 6"  (1.676 m), weight 221 lb (100.2 kg), SpO2 98 %. Body mass index is 35.67 kg/m.  General: Cooperative, alert, well developed, in no acute distress. HEENT: Conjunctivae and lids unremarkable. Cardiovascular: Regular rhythm.  Lungs: Normal work of breathing. Neurologic: No focal deficits.   Lab Results  Component Value Date   CREATININE 0.94 08/06/2021   BUN 10 08/06/2021   NA 146 (H) 08/06/2021   K 4.1 08/06/2021   CL 105 08/06/2021   CO2 26 08/06/2021   Lab Results  Component Value Date   ALT 26 08/06/2021   AST 21 08/06/2021   ALKPHOS 80 08/06/2021   BILITOT 0.4 08/06/2021   Lab Results  Component Value Date   HGBA1C 5.8 (H) 08/06/2021   HGBA1C 5.8 (H) 03/13/2021   HGBA1C 5.9 10/16/2020   HGBA1C 5.7 (H) 05/15/2020   HGBA1C 5.7 (H) 12/14/2019   Lab Results  Component Value Date   INSULIN 16.3 08/06/2021   INSULIN 12.4 05/15/2020   INSULIN 14.9 12/14/2019   INSULIN 11.2 07/13/2019   INSULIN 17.4 01/26/2019  Lab Results  Component Value Date   TSH 1.310 10/07/2018   Lab Results  Component Value Date   CHOL 158 12/14/2019   HDL 48 12/14/2019   LDLCALC 94 12/14/2019   TRIG 82 12/14/2019   Lab Results  Component Value Date   VD25OH 61.7 08/06/2021   VD25OH 50.1 03/13/2021   VD25OH 34.5 10/16/2020   Lab Results  Component Value Date   WBC 5.9 10/07/2018   HGB 13.5 10/07/2018   HCT 42.9 10/07/2018   MCV 81 10/07/2018   No results found for: "IRON",  "TIBC", "FERRITIN"  Attestation Statements:   Reviewed by clinician on day of visit: allergies, medications, problem list, medical history, surgical history, family history, social history, and previous encounter notes.  I, Fortino Sic, RMA am acting as transcriptionist for Reuben Likes, MD.  I have reviewed the above documentation for accuracy and completeness, and I agree with the above. - Reuben Likes, MD

## 2021-10-18 ENCOUNTER — Other Ambulatory Visit (HOSPITAL_COMMUNITY): Payer: Self-pay

## 2021-10-18 NOTE — Telephone Encounter (Signed)
Please call and ask if she would like to send to another pharmacy as this dose hasnt been on backorder. [facility]?

## 2021-10-18 NOTE — Telephone Encounter (Signed)
Please advise 

## 2021-11-13 ENCOUNTER — Other Ambulatory Visit (HOSPITAL_COMMUNITY): Payer: Self-pay

## 2021-11-13 ENCOUNTER — Encounter (INDEPENDENT_AMBULATORY_CARE_PROVIDER_SITE_OTHER): Payer: Self-pay | Admitting: Family Medicine

## 2021-11-13 ENCOUNTER — Ambulatory Visit (INDEPENDENT_AMBULATORY_CARE_PROVIDER_SITE_OTHER): Payer: BC Managed Care – PPO | Admitting: Family Medicine

## 2021-11-13 VITALS — BP 131/85 | HR 57 | Temp 98.4°F | Ht 66.0 in | Wt 220.0 lb

## 2021-11-13 DIAGNOSIS — R7303 Prediabetes: Secondary | ICD-10-CM | POA: Diagnosis not present

## 2021-11-13 DIAGNOSIS — I10 Essential (primary) hypertension: Secondary | ICD-10-CM | POA: Diagnosis not present

## 2021-11-13 DIAGNOSIS — Z6835 Body mass index (BMI) 35.0-35.9, adult: Secondary | ICD-10-CM | POA: Diagnosis not present

## 2021-11-13 DIAGNOSIS — E669 Obesity, unspecified: Secondary | ICD-10-CM | POA: Diagnosis not present

## 2021-11-13 MED ORDER — WEGOVY 2.4 MG/0.75ML ~~LOC~~ SOAJ
2.4000 mg | SUBCUTANEOUS | 0 refills | Status: DC
  Filled 2021-11-13: qty 3, 28d supply, fill #0

## 2021-11-13 MED ORDER — CHLORTHALIDONE 25 MG PO TABS
12.5000 mg | ORAL_TABLET | Freq: Every day | ORAL | 0 refills | Status: DC
  Filled 2021-11-13: qty 45, 90d supply, fill #0

## 2021-11-14 NOTE — Progress Notes (Signed)
Chief Complaint:   OBESITY Mackenzie Watson is here to discuss her progress with her obesity treatment plan along with follow-up of her obesity related diagnoses. [name] is on the [employer] +200 and states she is following her eating plan approximately 65-70% of the time. [name] states she is exercising and walking  30-45 minutes 3-4 times per week.  Today's visit was #: 47 Starting weight: 249 lbs Starting date: 10/07/2018 Today's weight: 220 lbs Today's date: 11/13/2021 Total lbs lost to date: 29 labs Total lbs lost since last in-office visit: 3  Interim History: Mackenzie Watson has been doing more walking and exercising. She is still exploring opportunities for change in job. She is trying to monitor sugar intake particularly from juice.  She really is trying to detox from sugar and increase water consumption.  Subjective:   1. Essential hypertension [name]'s blood pressure is well controlled today. Denies chest pain, chest pressure and headache. She is currently taking Chlorthalidone 12.5 mg. Denies any side effects.  2. Prediabetes [name]'s last A1c was 5.8. She is currently on GPL-1 now. Denies GI side effects.  Assessment/Plan:   1. Essential hypertension We will refill Chlorthalidone 12.5 mg by mouth daily with 0 refills.  -Refill chlorthalidone (HYGROTON) 25 MG tablet; Take 0.5 tablets (12.5 mg total) by mouth daily.  Dispense: 45 tablet; Refill: 0  2. Prediabetes Continue with GLP-1---repeat labs Sept or Oct.  3. Obesity with current BMI of 35.6 INCREASE Wegovy to 2.4 mg SubQ once weekly for 1 month with 0 refills.  -Increase/Refill Semaglutide-Weight Management (WEGOVY) 2.4 MG/0.75ML SOAJ; Inject 2.4 mg into the skin once a week.  Dispense: 3 mL; Refill: 0  Mackenzie Watson is currently in the action stage of change. As such, her goal is to continue with weight loss efforts. She has agreed to the [employer] Plan +200.  Exercise goals: As is.   Behavioral modification  strategies: increasing lean protein intake, meal planning and cooking strategies, keeping healthy foods in the home, and planning for success.  [name] has agreed to follow-up with our clinic in 4 weeks. She was informed of the importance of frequent follow-up visits to maximize her success with intensive lifestyle modifications for her multiple health conditions.   Objective:   Blood pressure 131/85, pulse (!) 57, temperature 98.4 F (36.9 C), height 5\' 6"  (1.676 m), weight 220 lb (99.8 kg), SpO2 99 %. Body mass index is 35.51 kg/m.  General: Cooperative, alert, well developed, in no acute distress. HEENT: Conjunctivae and lids unremarkable. Cardiovascular: Regular rhythm.  Lungs: Normal work of breathing. Neurologic: No focal deficits.   Lab Results  Component Value Date   CREATININE 0.94 08/06/2021   BUN 10 08/06/2021   NA 146 (H) 08/06/2021   K 4.1 08/06/2021   CL 105 08/06/2021   CO2 26 08/06/2021   Lab Results  Component Value Date   ALT 26 08/06/2021   AST 21 08/06/2021   ALKPHOS 80 08/06/2021   BILITOT 0.4 08/06/2021   Lab Results  Component Value Date   HGBA1C 5.8 (H) 08/06/2021   HGBA1C 5.8 (H) 03/13/2021   HGBA1C 5.9 10/16/2020   HGBA1C 5.7 (H) 05/15/2020   HGBA1C 5.7 (H) 12/14/2019   Lab Results  Component Value Date   INSULIN 16.3 08/06/2021   INSULIN 12.4 05/15/2020   INSULIN 14.9 12/14/2019   INSULIN 11.2 07/13/2019   INSULIN 17.4 01/26/2019   Lab Results  Component Value Date   TSH 1.310 10/07/2018   Lab Results  Component Value  Date   CHOL 158 12/14/2019   HDL 48 12/14/2019   LDLCALC 94 12/14/2019   TRIG 82 12/14/2019   Lab Results  Component Value Date   VD25OH 61.7 08/06/2021   VD25OH 50.1 03/13/2021   VD25OH 34.5 10/16/2020   Lab Results  Component Value Date   WBC 5.9 10/07/2018   HGB 13.5 10/07/2018   HCT 42.9 10/07/2018   MCV 81 10/07/2018   No results found for: "IRON", "TIBC", "FERRITIN"  Attestation Statements:    Reviewed by clinician on day of visit: allergies, medications, problem list, medical history, surgical history, family history, social history, and previous encounter notes.  I, Fortino Sic, RMA am acting as transcriptionist for Reuben Likes, MD.  I have reviewed the above documentation for accuracy and completeness, and I agree with the above. - Reuben Likes, MD

## 2021-11-28 ENCOUNTER — Other Ambulatory Visit (INDEPENDENT_AMBULATORY_CARE_PROVIDER_SITE_OTHER): Payer: Self-pay | Admitting: Family Medicine

## 2021-11-28 DIAGNOSIS — I10 Essential (primary) hypertension: Secondary | ICD-10-CM

## 2021-12-12 ENCOUNTER — Encounter (INDEPENDENT_AMBULATORY_CARE_PROVIDER_SITE_OTHER): Payer: Self-pay | Admitting: Family Medicine

## 2021-12-12 ENCOUNTER — Other Ambulatory Visit (HOSPITAL_COMMUNITY): Payer: Self-pay

## 2021-12-12 ENCOUNTER — Encounter (INDEPENDENT_AMBULATORY_CARE_PROVIDER_SITE_OTHER): Payer: Self-pay

## 2021-12-12 ENCOUNTER — Ambulatory Visit (INDEPENDENT_AMBULATORY_CARE_PROVIDER_SITE_OTHER): Payer: BC Managed Care – PPO | Admitting: Family Medicine

## 2021-12-12 VITALS — BP 130/84 | HR 100 | Temp 98.8°F | Ht 66.0 in | Wt 214.0 lb

## 2021-12-12 DIAGNOSIS — I10 Essential (primary) hypertension: Secondary | ICD-10-CM | POA: Diagnosis not present

## 2021-12-12 DIAGNOSIS — R7303 Prediabetes: Secondary | ICD-10-CM

## 2021-12-12 DIAGNOSIS — Z6834 Body mass index (BMI) 34.0-34.9, adult: Secondary | ICD-10-CM | POA: Diagnosis not present

## 2021-12-12 DIAGNOSIS — E669 Obesity, unspecified: Secondary | ICD-10-CM | POA: Diagnosis not present

## 2021-12-12 MED ORDER — WEGOVY 2.4 MG/0.75ML ~~LOC~~ SOAJ
2.4000 mg | SUBCUTANEOUS | 0 refills | Status: DC
  Filled 2021-12-12: qty 3, 28d supply, fill #0

## 2021-12-13 ENCOUNTER — Other Ambulatory Visit (HOSPITAL_COMMUNITY): Payer: Self-pay

## 2021-12-13 MED ORDER — ESCITALOPRAM OXALATE 10 MG PO TABS
10.0000 mg | ORAL_TABLET | Freq: Every day | ORAL | 12 refills | Status: DC
  Filled 2021-12-13: qty 30, 30d supply, fill #0
  Filled 2022-01-14: qty 30, 30d supply, fill #1
  Filled 2022-02-28: qty 30, 30d supply, fill #2
  Filled 2022-03-22 – 2022-04-12 (×4): qty 30, 30d supply, fill #3
  Filled 2022-05-06: qty 30, 30d supply, fill #4
  Filled 2022-06-10: qty 30, 30d supply, fill #5
  Filled 2022-07-09: qty 30, 30d supply, fill #6
  Filled 2022-08-17: qty 30, 30d supply, fill #7
  Filled 2022-09-24: qty 30, 30d supply, fill #8
  Filled 2022-10-21: qty 30, 30d supply, fill #9

## 2021-12-18 NOTE — Progress Notes (Signed)
Chief Complaint:   OBESITY Mackenzie Watson is here to discuss her progress with her obesity treatment plan along with follow-up of her obesity related diagnoses. Mackenzie Watson is on the [employer] +200 and states she is following her eating plan approximately 75% of the time. Mackenzie Watson states she is walking 30-60 minutes 2-3 times per week.  Today's visit was #: 48 Starting weight: 249 lbs Starting date: 10/07/2018 Today's weight: 214 lbs Today's date: 12/12/2021 Total lbs lost to date: 35 lbs Total lbs lost since last in-office visit: 6  Interim History: Mackenzie Watson has been trying to stay cool and relax before school starting again. She has been really fousing on [employer] plan and increase water intake. 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 She like the plan and enjoys flexibility.  Subjective:   1. Essential hypertension Mackenzie Watson's blood pressure well managed. Denies chest pain, chest pressure and headache.  2. Prediabetes Mackenzie Watson's A1c in April 2023 was 5.8. She is currently on a GLP-1.  Assessment/Plan:   1. Essential hypertension Mackenzie Watson will continue taking Chlorthalidone 12.5 mg without changes in dose.  2. Prediabetes Mackenzie Watson will have labs obtained with PCP tomorrow.  3. Obesity with current BMI of 34.7 We will refill Wegovy 2.4 mg SubQ once weekly for 1 month with 0 refills.  - Refill Semaglutide-Weight Management (WEGOVY) 2.4 MG/0.75ML SOAJ; Inject 2.4 mg into the skin once a week.  Dispense: 3 mL; Refill: 0  Mackenzie Watson is currently in the action stage of change. As such, her goal is to continue with weight loss efforts. She has agreed to the [employer] Plan +200.  Exercise goals: All adults should avoid inactivity. Some physical activity is better than none, and adults who participate in any amount of physical activity gain some health benefits.  Behavioral modification strategies: increasing lean protein intake, meal planning and cooking strategies,  keeping healthy foods in the home, and planning for success.  Mackenzie Watson has agreed to follow-up with our clinic in 4 weeks. She was informed of the importance of frequent follow-up visits to maximize her success with intensive lifestyle modifications for her multiple health conditions.   Objective:   Blood pressure 130/84, pulse 100, temperature 98.8 F (37.1 C), height 5\' 6"  (1.676 m), weight 214 lb (97.1 kg), SpO2 97 %. Body mass index is 34.54 kg/m.  General: Cooperative, alert, well developed, in no acute distress. HEENT: Conjunctivae and lids unremarkable. Cardiovascular: Regular rhythm.  Lungs: Normal work of breathing. Neurologic: No focal deficits.   Lab Results  Component Value Date   CREATININE 0.94 08/06/2021   BUN 10 08/06/2021   NA 146 (H) 08/06/2021   K 4.1 08/06/2021   CL 105 08/06/2021   CO2 26 08/06/2021   Lab Results  Component Value Date   ALT 26 08/06/2021   AST 21 08/06/2021   ALKPHOS 80 08/06/2021   BILITOT 0.4 08/06/2021   Lab Results  Component Value Date   HGBA1C 5.8 (H) 08/06/2021   HGBA1C 5.8 (H) 03/13/2021   HGBA1C 5.9 10/16/2020   HGBA1C 5.7 (H) 05/15/2020   HGBA1C 5.7 (H) 12/14/2019   Lab Results  Component Value Date   INSULIN 16.3 08/06/2021   INSULIN 12.4 05/15/2020   INSULIN 14.9 12/14/2019   INSULIN 11.2 07/13/2019   INSULIN 17.4 01/26/2019   Lab Results  Component Value Date   TSH 1.310 10/07/2018   Lab Results  Component Value Date   CHOL 158 12/14/2019   HDL 48 12/14/2019  LDLCALC 94 12/14/2019   TRIG 82 12/14/2019   Lab Results  Component Value Date   VD25OH 61.7 08/06/2021   VD25OH 50.1 03/13/2021   VD25OH 34.5 10/16/2020   Lab Results  Component Value Date   WBC 5.9 10/07/2018   HGB 13.5 10/07/2018   HCT 42.9 10/07/2018   MCV 81 10/07/2018   No results found for: "IRON", "TIBC", "FERRITIN"  Attestation Statements:   Reviewed by clinician on day of visit: allergies, medications, problem list, medical  history, surgical history, family history, social history, and previous encounter notes.  I, Fortino Sic, RMA am acting as transcriptionist for Reuben Likes, MD.  I have reviewed the above documentation for accuracy and completeness, and I agree with the above. - Reuben Likes, MD

## 2022-01-09 ENCOUNTER — Encounter (INDEPENDENT_AMBULATORY_CARE_PROVIDER_SITE_OTHER): Payer: Self-pay | Admitting: Family Medicine

## 2022-01-09 ENCOUNTER — Other Ambulatory Visit (HOSPITAL_COMMUNITY): Payer: Self-pay

## 2022-01-09 ENCOUNTER — Ambulatory Visit (INDEPENDENT_AMBULATORY_CARE_PROVIDER_SITE_OTHER): Payer: BC Managed Care – PPO | Admitting: Family Medicine

## 2022-01-09 VITALS — BP 132/88 | HR 71 | Temp 98.6°F | Ht 66.0 in | Wt 210.0 lb

## 2022-01-09 DIAGNOSIS — Z6841 Body Mass Index (BMI) 40.0 and over, adult: Secondary | ICD-10-CM

## 2022-01-09 DIAGNOSIS — I10 Essential (primary) hypertension: Secondary | ICD-10-CM | POA: Diagnosis not present

## 2022-01-09 DIAGNOSIS — Z6833 Body mass index (BMI) 33.0-33.9, adult: Secondary | ICD-10-CM

## 2022-01-09 DIAGNOSIS — E669 Obesity, unspecified: Secondary | ICD-10-CM

## 2022-01-09 DIAGNOSIS — R7303 Prediabetes: Secondary | ICD-10-CM

## 2022-01-09 MED ORDER — WEGOVY 2.4 MG/0.75ML ~~LOC~~ SOAJ
2.4000 mg | SUBCUTANEOUS | 0 refills | Status: DC
  Filled 2022-01-09: qty 3, 28d supply, fill #0

## 2022-01-11 NOTE — Progress Notes (Signed)
Chief Complaint:   OBESITY Mackenzie Watson is here to discuss her progress with her obesity treatment plan along with follow-up of her obesity related diagnoses. Mackenzie Watson is on the [employer] + 200 and states she is following her eating plan approximately 80% of the time. Mackenzie Watson states she is walking 10,000 steps 5 times per week.  Today's visit was #: 49 Starting weight: 249 lbs Starting date: 10/07/2018 Today's weight: 210 lbs Today's date: 01/09/2022 Total lbs lost to date: 39 lbs Total lbs lost since last in-office visit: 4  Interim History: Mackenzie Watson is back at school now. She did change schools since last year (now at [employer]). 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 Still following Pes[employer]arian plan--easy to prep because seafood is so fresh. Not anything specific planned.  Subjective:   1. Essential hypertension Mackenzie Watson blood pressure is well controlled today. Denies chest pain, chest pressure and headache.  2. Prediabetes Mackenzie Watson's last A1c 5.8, insulin 16.3. She is on GLP-1. No changes in dose.  Assessment/Plan:   1. Essential hypertension Continue Chlorthalindone 12.5 mg. No refills needed.  2. Prediabetes Continue with GLP-1 with no changes in dose.  3. Obesity with current BMI of 33.9 We will refill Wegovy 2.4 mg SubQ once week for 1 month with 0 refills.  -Refill Semaglutide-Weight Management (WEGOVY) 2.4 MG/0.75ML SOAJ; Inject 2.4 mg into the skin once a week.  Dispense: 3 mL; Refill: 0  Mackenzie Watson is currently in the action stage of change. As such, her goal is to continue with weight loss efforts. She has agreed to the [employer] Plan +200.  Exercise goals: All adults should avoid inactivity. Some physical activity is better than none, and adults who participate in any amount of physical activity gain some health benefits.  Behavioral modification strategies: increasing lean protein intake, meal planning and cooking strategies, and  keeping healthy foods in the home.  Mackenzie Watson has agreed to follow-up with our clinic in 4 weeks. She was informed of the importance of frequent follow-up visits to maximize her success with intensive lifestyle modifications for her multiple health conditions.   Objective:   Blood pressure 132/88, pulse 71, temperature 98.6 F (37 C), height 5\' 6"  (1.676 m), weight 210 lb (95.3 kg), SpO2 99 %. Body mass index is 33.89 kg/m.  General: Cooperative, alert, well developed, in no acute distress. HEENT: Conjunctivae and lids unremarkable. Cardiovascular: Regular rhythm.  Lungs: Normal work of breathing. Neurologic: No focal deficits.   Lab Results  Component Value Date   CREATININE 0.94 08/06/2021   BUN 10 08/06/2021   NA 146 (H) 08/06/2021   K 4.1 08/06/2021   CL 105 08/06/2021   CO2 26 08/06/2021   Lab Results  Component Value Date   ALT 26 08/06/2021   AST 21 08/06/2021   ALKPHOS 80 08/06/2021   BILITOT 0.4 08/06/2021   Lab Results  Component Value Date   HGBA1C 5.8 (H) 08/06/2021   HGBA1C 5.8 (H) 03/13/2021   HGBA1C 5.9 10/16/2020   HGBA1C 5.7 (H) 05/15/2020   HGBA1C 5.7 (H) 12/14/2019   Lab Results  Component Value Date   INSULIN 16.3 08/06/2021   INSULIN 12.4 05/15/2020   INSULIN 14.9 12/14/2019   INSULIN 11.2 07/13/2019   INSULIN 17.4 01/26/2019   Lab Results  Component Value Date   TSH 1.310 10/07/2018   Lab Results  Component Value Date   CHOL 158 12/14/2019   HDL 48 12/14/2019   LDLCALC 94 12/14/2019  TRIG 82 12/14/2019   Lab Results  Component Value Date   VD25OH 61.7 08/06/2021   VD25OH 50.1 03/13/2021   VD25OH 34.5 10/16/2020   Lab Results  Component Value Date   WBC 5.9 10/07/2018   HGB 13.5 10/07/2018   HCT 42.9 10/07/2018   MCV 81 10/07/2018   No results found for: "IRON", "TIBC", "FERRITIN"  Attestation Statements:   Reviewed by clinician on day of visit: allergies, medications, problem list, medical history, surgical history,  family history, social history, and previous encounter notes.  I, Fortino Sic, RMA am acting as transcriptionist for Reuben Likes, MD.  I have reviewed the above documentation for accuracy and completeness, and I agree with the above. - Reuben Likes, MD

## 2022-01-14 ENCOUNTER — Other Ambulatory Visit (HOSPITAL_COMMUNITY): Payer: Self-pay

## 2022-01-15 ENCOUNTER — Other Ambulatory Visit (HOSPITAL_COMMUNITY): Payer: Self-pay

## 2022-02-11 ENCOUNTER — Encounter (INDEPENDENT_AMBULATORY_CARE_PROVIDER_SITE_OTHER): Payer: Self-pay | Admitting: Family Medicine

## 2022-02-11 ENCOUNTER — Other Ambulatory Visit (HOSPITAL_COMMUNITY): Payer: Self-pay

## 2022-02-11 ENCOUNTER — Ambulatory Visit (INDEPENDENT_AMBULATORY_CARE_PROVIDER_SITE_OTHER): Payer: BC Managed Care – PPO | Admitting: Family Medicine

## 2022-02-11 VITALS — BP 133/87 | HR 72 | Temp 98.3°F | Ht 66.0 in | Wt 213.0 lb

## 2022-02-11 DIAGNOSIS — E669 Obesity, unspecified: Secondary | ICD-10-CM

## 2022-02-11 DIAGNOSIS — Z6834 Body mass index (BMI) 34.0-34.9, adult: Secondary | ICD-10-CM

## 2022-02-11 DIAGNOSIS — F32A Depression, unspecified: Secondary | ICD-10-CM

## 2022-02-11 DIAGNOSIS — I10 Essential (primary) hypertension: Secondary | ICD-10-CM

## 2022-02-11 DIAGNOSIS — R7303 Prediabetes: Secondary | ICD-10-CM

## 2022-02-11 MED ORDER — WEGOVY 2.4 MG/0.75ML ~~LOC~~ SOAJ
2.4000 mg | SUBCUTANEOUS | 0 refills | Status: DC
  Filled 2022-02-11 – 2022-02-13 (×2): qty 9, 84d supply, fill #0

## 2022-02-11 MED ORDER — CHLORTHALIDONE 25 MG PO TABS
12.5000 mg | ORAL_TABLET | Freq: Every day | ORAL | 0 refills | Status: DC
  Filled 2022-02-11: qty 45, 90d supply, fill #0

## 2022-02-12 ENCOUNTER — Other Ambulatory Visit (HOSPITAL_COMMUNITY): Payer: Self-pay

## 2022-02-13 ENCOUNTER — Other Ambulatory Visit (HOSPITAL_COMMUNITY): Payer: Self-pay

## 2022-02-13 ENCOUNTER — Telehealth (INDEPENDENT_AMBULATORY_CARE_PROVIDER_SITE_OTHER): Payer: Self-pay | Admitting: Family Medicine

## 2022-02-13 ENCOUNTER — Encounter (INDEPENDENT_AMBULATORY_CARE_PROVIDER_SITE_OTHER): Payer: Self-pay

## 2022-02-13 NOTE — Telephone Encounter (Signed)
Dr. Jearld Shines - Prior authorization approved for [id number]. Effective: 02/12/2022 - 02/13/2023. Patient sent approval message via mychart.

## 2022-02-14 NOTE — Progress Notes (Signed)
Chief Complaint:   OBESITY Mackenzie Watson is here to discuss her progress with her obesity treatment plan along with follow-up of her obesity related diagnoses. Mackenzie Watson is on the [employer] +200 and states she is following her eating plan approximately 75% of the time. Mackenzie Watson states she is walking 10,000 steps 5 times per week.  Today's visit was #: 50 Starting weight: 249 lbs Starting date: 10/07/2018 Today's weight: 213 lbs Today's date: 02/11/2022 Total lbs lost to date: 36 lbs Total lbs lost since last in-office visit: 0  Interim History: Mackenzie Watson went out of town this past weekend to [place] where she is from. Work has become more a double job with change in job. Has indulged more frequently since last appointment but is feeling more hungry. Has been doing more with schoolin terms of activity after school. Snack calories have been apple with PB or orange.  Subjective:   1. Essential hypertension Blood pressure is controlled today. Denies chest pain, chest pressure and headache. On Chlorthalidone.  2. Prediabetes Last A1c 5.8, insulin 16.3. On GLP-1, still some cravings.  Assessment/Plan:   1. Essential hypertension We will refill Chlorthalidone 0.5 mg daily for 1 month with 0 refills.  -Refill chlorthalidone (HYGROTON) 25 MG tablet; Take 0.5 tablets (12.5 mg total) by mouth daily.  Dispense: 45 tablet; Refill: 0  2. Prediabetes Continue with GLP-1--labs with PCP in Dec.  3. Obesity with current BMI of 34.4 We will refill Wegovy 2.4 mg once a week for 1 month with 0 refills.  -Refill Semaglutide-Weight Management (WEGOVY) 2.4 MG/0.75ML SOAJ; Inject 2.4 mg into the skin once a week.  Dispense: 9 mL; Refill: 0  Mackenzie Watson is currently in the action stage of change. As such, her goal is to continue with weight loss efforts. She has agreed to the [employer].   Exercise goals: As is. Mackenzie Watson will start contemplating activity options as weather changes.  Behavioral  modification strategies: increasing lean protein intake, meal planning and cooking strategies, keeping healthy foods in the home, and planning for success.  Mackenzie Watson has agreed to follow-up with our clinic in 6 weeks. She was informed of the importance of frequent follow-up visits to maximize her success with intensive lifestyle modifications for her multiple health conditions.   Objective:   Blood pressure 133/87, pulse 72, temperature 98.3 F (36.8 C), height 5\' 6"  (1.676 m), weight 213 lb (96.6 kg), SpO2 99 %. Body mass index is 34.38 kg/m.  General: Cooperative, alert, well developed, in no acute distress. HEENT: Conjunctivae and lids unremarkable. Cardiovascular: Regular rhythm.  Lungs: Normal work of breathing. Neurologic: No focal deficits.   Lab Results  Component Value Date   CREATININE 0.94 08/06/2021   BUN 10 08/06/2021   NA 146 (H) 08/06/2021   K 4.1 08/06/2021   CL 105 08/06/2021   CO2 26 08/06/2021   Lab Results  Component Value Date   ALT 26 08/06/2021   AST 21 08/06/2021   ALKPHOS 80 08/06/2021   BILITOT 0.4 08/06/2021   Lab Results  Component Value Date   HGBA1C 5.8 (H) 08/06/2021   HGBA1C 5.8 (H) 03/13/2021   HGBA1C 5.9 10/16/2020   HGBA1C 5.7 (H) 05/15/2020   HGBA1C 5.7 (H) 12/14/2019   Lab Results  Component Value Date   INSULIN 16.3 08/06/2021   INSULIN 12.4 05/15/2020   INSULIN 14.9 12/14/2019   INSULIN 11.2 07/13/2019   INSULIN 17.4 01/26/2019   Lab Results  Component Value Date   TSH 1.310 10/07/2018  Lab Results  Component Value Date   CHOL 158 12/14/2019   HDL 48 12/14/2019   LDLCALC 94 12/14/2019   TRIG 82 12/14/2019   Lab Results  Component Value Date   VD25OH 61.7 08/06/2021   VD25OH 50.1 03/13/2021   VD25OH 34.5 10/16/2020   Lab Results  Component Value Date   WBC 5.9 10/07/2018   HGB 13.5 10/07/2018   HCT 42.9 10/07/2018   MCV 81 10/07/2018   No results found for: "IRON", "TIBC", "FERRITIN"  Attestation  Statements:   Reviewed by clinician on day of visit: allergies, medications, problem list, medical history, surgical history, family history, social history, and previous encounter notes.  I, Elnora Morrison, RMA am acting as transcriptionist for Coralie Common, MD.  I have reviewed the above documentation for accuracy and completeness, and I agree with the above. - Coralie Common, MD

## 2022-02-28 ENCOUNTER — Other Ambulatory Visit (HOSPITAL_COMMUNITY): Payer: Self-pay

## 2022-03-01 ENCOUNTER — Other Ambulatory Visit (HOSPITAL_COMMUNITY): Payer: Self-pay

## 2022-03-22 ENCOUNTER — Other Ambulatory Visit (HOSPITAL_COMMUNITY): Payer: Self-pay

## 2022-03-25 ENCOUNTER — Ambulatory Visit (INDEPENDENT_AMBULATORY_CARE_PROVIDER_SITE_OTHER): Payer: BC Managed Care – PPO | Admitting: Family Medicine

## 2022-03-25 ENCOUNTER — Encounter (INDEPENDENT_AMBULATORY_CARE_PROVIDER_SITE_OTHER): Payer: Self-pay | Admitting: Family Medicine

## 2022-03-25 VITALS — BP 133/87 | HR 101 | Temp 98.2°F | Ht 66.0 in | Wt 212.0 lb

## 2022-03-25 DIAGNOSIS — I10 Essential (primary) hypertension: Secondary | ICD-10-CM

## 2022-03-25 DIAGNOSIS — R7303 Prediabetes: Secondary | ICD-10-CM | POA: Diagnosis not present

## 2022-03-25 DIAGNOSIS — E669 Obesity, unspecified: Secondary | ICD-10-CM

## 2022-03-25 DIAGNOSIS — Z6834 Body mass index (BMI) 34.0-34.9, adult: Secondary | ICD-10-CM | POA: Diagnosis not present

## 2022-03-29 ENCOUNTER — Other Ambulatory Visit (HOSPITAL_COMMUNITY): Payer: Self-pay

## 2022-04-03 NOTE — Progress Notes (Signed)
Chief Complaint:   OBESITY Mackenzie Watson is here to discuss her progress with her obesity treatment plan along with follow-up of her obesity related diagnoses. Mackenzie Watson is on the [employer] and states she is following her eating plan approximately 75% of the time. Mackenzie Watson states she is walking 30 minutes 3-4 times per week.  Today's visit was #: 51 Starting weight: 249 lbs Starting date: 10/07/2018 Today's weight: 212 lbs Today's date: 03/25/2022 Total lbs lost to date: 37 lbs Total lbs lost since last in-office visit: 1  Interim History: Mackenzie Watson just got back into town--lost her godmother recently. Trying to get on meal plan consistently. Considering going back home for a few days for Thanksgiving. Has been food prepping more consistently. Not really tired of what she is doing food wise. Got all 3 months of Wegovy at last fill.  Subjective:   1. Essential hypertension Mackenzie Watson's blood pressure well controlled today. Denies chest pain, chest pressure and headache.  2. Prediabetes Mackenzie Watson's last A1c was 5.8, insulin was 16. She is on GLP-1, Wegovy 2.4 mg.  Assessment/Plan:   1. Essential hypertension Continue Chlorthalidone without any changes in medication or dose.  2. Prediabetes Continue medication--labs with PCP.  3. Obesity with current BMI of 34.2 Mackenzie Watson is currently in the action stage of change. As such, her goal is to continue with weight loss efforts. She has agreed to the [employer].   Exercise goals: All adults should avoid inactivity. Some physical activity is better than none, and adults who participate in any amount of physical activity gain some health benefits.  Behavioral modification strategies: increasing lean protein intake, meal planning and cooking strategies, and keeping healthy foods in the home.  Mackenzie Watson has agreed to follow-up with our clinic in 6 weeks. She was informed of the importance of frequent follow-up visits to maximize her success  with intensive lifestyle modifications for her multiple health conditions.   Objective:   Blood pressure 133/87, pulse (!) 101, temperature 98.2 F (36.8 C), height 5\' 6"  (1.676 m), weight 212 lb (96.2 kg), SpO2 99 %. Body mass index is 34.22 kg/m.  General: Cooperative, alert, well developed, in no acute distress. HEENT: Conjunctivae and lids unremarkable. Cardiovascular: Regular rhythm.  Lungs: Normal work of breathing. Neurologic: No focal deficits.   Lab Results  Component Value Date   CREATININE 0.94 08/06/2021   BUN 10 08/06/2021   NA 146 (H) 08/06/2021   K 4.1 08/06/2021   CL 105 08/06/2021   CO2 26 08/06/2021   Lab Results  Component Value Date   ALT 26 08/06/2021   AST 21 08/06/2021   ALKPHOS 80 08/06/2021   BILITOT 0.4 08/06/2021   Lab Results  Component Value Date   HGBA1C 5.8 (H) 08/06/2021   HGBA1C 5.8 (H) 03/13/2021   HGBA1C 5.9 10/16/2020   HGBA1C 5.7 (H) 05/15/2020   HGBA1C 5.7 (H) 12/14/2019   Lab Results  Component Value Date   INSULIN 16.3 08/06/2021   INSULIN 12.4 05/15/2020   INSULIN 14.9 12/14/2019   INSULIN 11.2 07/13/2019   INSULIN 17.4 01/26/2019   Lab Results  Component Value Date   TSH 1.310 10/07/2018   Lab Results  Component Value Date   CHOL 158 12/14/2019   HDL 48 12/14/2019   LDLCALC 94 12/14/2019   TRIG 82 12/14/2019   Lab Results  Component Value Date   VD25OH 61.7 08/06/2021   VD25OH 50.1 03/13/2021   VD25OH 34.5 10/16/2020   Lab Results  Component Value  Date   WBC 5.9 10/07/2018   HGB 13.5 10/07/2018   HCT 42.9 10/07/2018   MCV 81 10/07/2018   No results found for: "IRON", "TIBC", "FERRITIN"  Attestation Statements:   Reviewed by clinician on day of visit: allergies, medications, problem list, medical history, surgical history, family history, social history, and previous encounter notes.  I, Mackenzie Watson, RMA am acting as transcriptionist for Mackenzie Common, MD.  I have reviewed the above  documentation for accuracy and completeness, and I agree with the above. - Mackenzie Common, MD

## 2022-04-05 ENCOUNTER — Other Ambulatory Visit (HOSPITAL_COMMUNITY): Payer: Self-pay

## 2022-04-09 ENCOUNTER — Other Ambulatory Visit (HOSPITAL_COMMUNITY): Payer: Self-pay

## 2022-04-12 ENCOUNTER — Other Ambulatory Visit (HOSPITAL_COMMUNITY): Payer: Self-pay

## 2022-05-08 ENCOUNTER — Encounter (INDEPENDENT_AMBULATORY_CARE_PROVIDER_SITE_OTHER): Payer: Self-pay | Admitting: Family Medicine

## 2022-05-08 ENCOUNTER — Other Ambulatory Visit (HOSPITAL_COMMUNITY): Payer: Self-pay

## 2022-05-08 ENCOUNTER — Ambulatory Visit (INDEPENDENT_AMBULATORY_CARE_PROVIDER_SITE_OTHER): Payer: BC Managed Care – PPO | Admitting: Family Medicine

## 2022-05-08 VITALS — BP 117/82 | HR 90 | Temp 98.2°F | Ht 66.0 in | Wt 216.0 lb

## 2022-05-08 DIAGNOSIS — E669 Obesity, unspecified: Secondary | ICD-10-CM | POA: Diagnosis not present

## 2022-05-08 DIAGNOSIS — E559 Vitamin D deficiency, unspecified: Secondary | ICD-10-CM | POA: Diagnosis not present

## 2022-05-08 DIAGNOSIS — Z6834 Body mass index (BMI) 34.0-34.9, adult: Secondary | ICD-10-CM

## 2022-05-08 MED ORDER — WEGOVY 2.4 MG/0.75ML ~~LOC~~ SOAJ
2.4000 mg | SUBCUTANEOUS | 0 refills | Status: DC
  Filled 2022-05-08: qty 3, 28d supply, fill #0
  Filled 2022-06-10: qty 3, 28d supply, fill #1

## 2022-05-09 ENCOUNTER — Other Ambulatory Visit (HOSPITAL_COMMUNITY): Payer: Self-pay

## 2022-05-20 NOTE — Progress Notes (Signed)
Chief Complaint:   OBESITY Mackenzie Watson is here to discuss her progress with her obesity treatment plan along with follow-up of her obesity related diagnoses. Mackenzie Watson is on the [employer] and states she is following her eating plan approximately 75% of the time. Mackenzie Watson states she is walking/Peloton bike 30/15 minutes 2-6 times per week.  Today's visit was #: 86 Starting weight: 249 lbs Starting date: 10/07/2018 Today's weight: 216 lbs Today's date: 05/08/2022 Total lbs lost to date: 33 lbs Total lbs lost since last in-office visit: 0  Interim History: Mackenzie Watson had December 22 to January 2 off.  She has been trying to stay closer to meal plan but recognizes she overindulged in desserts.  Wants to get back to Presctarian plan.  She is considering getting a Peloton for home to increase activity.  Subjective:   1. Vitamin D deficiency Mackenzie Watson is on 5k IU weekly. Labs done with Dr. Esmeralda Links 2-3 weeks ago.  Assessment/Plan:   1. Vitamin D deficiency Continue over the counter without changes in dose.  Follow up on labs from PCP.  2. Obesity with current BMI of 34.9 We will refill Wegovy 2.4 mg SQ once a week for 1 month with 0 refills.  -Refill Semaglutide-Weight Management (WEGOVY) 2.4 MG/0.75ML SOAJ; Inject 2.4 mg into the skin once a week.  Dispense: 9 mL; Refill: 0  Mackenzie Watson is currently in the action stage of change. As such, her goal is to continue with weight loss efforts. She has agreed to the [employer].   Exercise goals: All adults should avoid inactivity. Some physical activity is better than none, and adults who participate in any amount of physical activity gain some health benefits.  Behavioral modification strategies: increasing lean protein intake, meal planning and cooking strategies, keeping healthy foods in the home, and planning for success.  Mackenzie Watson has agreed to follow-up with our clinic in 4 weeks. She was informed of the importance of frequent follow-up  visits to maximize her success with intensive lifestyle modifications for her multiple health conditions.   Objective:   Blood pressure 117/82, pulse 90, temperature 98.2 F (36.8 C), height 5\' 6"  (1.676 m), weight 216 lb (98 kg), SpO2 99 %. Body mass index is 34.86 kg/m.  General: Cooperative, alert, well developed, in no acute distress. HEENT: Conjunctivae and lids unremarkable. Cardiovascular: Regular rhythm.  Lungs: Normal work of breathing. Neurologic: No focal deficits.   Lab Results  Component Value Date   CREATININE 0.94 08/06/2021   BUN 10 08/06/2021   NA 146 (H) 08/06/2021   K 4.1 08/06/2021   CL 105 08/06/2021   CO2 26 08/06/2021   Lab Results  Component Value Date   ALT 26 08/06/2021   AST 21 08/06/2021   ALKPHOS 80 08/06/2021   BILITOT 0.4 08/06/2021   Lab Results  Component Value Date   HGBA1C 5.8 (H) 08/06/2021   HGBA1C 5.8 (H) 03/13/2021   HGBA1C 5.9 10/16/2020   HGBA1C 5.7 (H) 05/15/2020   HGBA1C 5.7 (H) 12/14/2019   Lab Results  Component Value Date   INSULIN 16.3 08/06/2021   INSULIN 12.4 05/15/2020   INSULIN 14.9 12/14/2019   INSULIN 11.2 07/13/2019   INSULIN 17.4 01/26/2019   Lab Results  Component Value Date   TSH 1.310 10/07/2018   Lab Results  Component Value Date   CHOL 158 12/14/2019   HDL 48 12/14/2019   LDLCALC 94 12/14/2019   TRIG 82 12/14/2019   Lab Results  Component Value Date  VD25OH 61.7 08/06/2021   VD25OH 50.1 03/13/2021   VD25OH 34.5 10/16/2020   Lab Results  Component Value Date   WBC 5.9 10/07/2018   HGB 13.5 10/07/2018   HCT 42.9 10/07/2018   MCV 81 10/07/2018   No results found for: "IRON", "TIBC", "FERRITIN"  Attestation Statements:   Reviewed by clinician on day of visit: allergies, medications, problem list, medical history, surgical history, family history, social history, and previous encounter notes.  I, Elnora Morrison, RMA am acting as transcriptionist for Coralie Common, MD.  I have  reviewed the above documentation for accuracy and completeness, and I agree with the above. - Coralie Common, MD

## 2022-06-10 ENCOUNTER — Other Ambulatory Visit (HOSPITAL_COMMUNITY): Payer: Self-pay

## 2022-06-12 ENCOUNTER — Encounter (INDEPENDENT_AMBULATORY_CARE_PROVIDER_SITE_OTHER): Payer: Self-pay | Admitting: Family Medicine

## 2022-06-12 ENCOUNTER — Ambulatory Visit (INDEPENDENT_AMBULATORY_CARE_PROVIDER_SITE_OTHER): Payer: BC Managed Care – PPO | Admitting: Family Medicine

## 2022-06-12 VITALS — BP 134/83 | HR 82 | Temp 98.4°F | Ht 66.0 in | Wt 213.0 lb

## 2022-06-12 DIAGNOSIS — Z6834 Body mass index (BMI) 34.0-34.9, adult: Secondary | ICD-10-CM

## 2022-06-12 DIAGNOSIS — E669 Obesity, unspecified: Secondary | ICD-10-CM | POA: Diagnosis not present

## 2022-06-12 DIAGNOSIS — I10 Essential (primary) hypertension: Secondary | ICD-10-CM

## 2022-06-12 MED ORDER — WEGOVY 2.4 MG/0.75ML ~~LOC~~ SOAJ: 2.4000 mg | SUBCUTANEOUS | 0 refills | Status: DC

## 2022-06-12 NOTE — Progress Notes (Deleted)
Patient has gotten back into school after Christmas.  She is trying to stick it out with work for the rest of the year.  She is looking to pursue opportunities outside of the classroom.  Foodwise she is following Pescatarian plan and very occasional steak or chicken.  She follows mostly pescatarian even at home.  She is still walking and is looking at doing some pilates at home as some of her friends are doing this.  After this week she feels like could start being more assertive with looking for pilates and implementing it in her routine.  Voices some concern about cessation of Wegovy as the state plan will no longer cover this as of April of this year. 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

## 2022-06-13 ENCOUNTER — Telehealth (INDEPENDENT_AMBULATORY_CARE_PROVIDER_SITE_OTHER): Payer: Self-pay

## 2022-06-13 NOTE — Telephone Encounter (Signed)
PA started for [facility] 06/13/22

## 2022-06-13 NOTE — Telephone Encounter (Signed)
Mackenzie Watson (Key: [id number]) JOINOM 2.4MG /0.75ML auto-injectors  Approved-no exp date given.

## 2022-06-17 ENCOUNTER — Encounter (INDEPENDENT_AMBULATORY_CARE_PROVIDER_SITE_OTHER): Payer: Self-pay

## 2022-06-26 NOTE — Progress Notes (Signed)
Chief Complaint:   OBESITY Mackenzie Watson is here to discuss her progress with her obesity treatment plan along with follow-up of her obesity related diagnoses. Mackenzie Watson is on the [employer] and states she is following her eating plan approximately 75% of the time. Mackenzie Watson states she is walking 30 minutes 3 times per week.  Today's visit was #: 42 Starting weight: 249 lbs Starting date: 10/07/2018 Today's weight: 213 lbs Today's date: 06/12/2022 Total lbs lost to date: 36 Total lbs lost since last in-office visit: 3  Interim History:Mackenzie Watson has gotten back into school after Christmas.  She is trying to stick it out with work for the rest of the year.  She is looking to pursue opportunities outside of the classroom.  Foodwise she is following Pescatarian plan and very occasional steak or chicken.  She follows mostly pescatarian even at home.  She is still walking and is looking at doing some pilates at home as some of her friends are doing this.  After this week she feels like could start being more assertive with looking for pilates and implementing it in her routine.  Voices some concern about cessation of Wegovy as the state plan will no longer cover this as of April of this year. 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  Subjective:   1. Essential hypertension Blood pressure well-controlled today.  On Chlorthalidone 25 Mg.  Assessment/Plan:   1. Essential hypertension Will continue with current medications without refills needed today.  2. Obesity with current BMI of 34.4 Will refill Wegovy 2.4 mg SQ once a week for 1 month with 0 refills.  -Refill Semaglutide-Weight Management (WEGOVY) 2.4 MG/0.75ML SOAJ; Inject 2.4 mg into the skin once a week.  Dispense: 9 mL; Refill: 0  Mackenzie Watson is currently in the action stage of change. As such, her goal is to continue with weight loss efforts. She has agreed to the [employer].    Exercise goals: All adults should avoid inactivity. Some physical activity is better than none, and adults who participate in any amount of physical activity gain some health benefits.  Behavioral modification strategies: increasing lean protein intake, meal planning and cooking strategies, keeping healthy foods in the home, and planning for success.  Mackenzie Watson has agreed to follow-up with our clinic in 4 weeks. She was informed of the importance of frequent follow-up visits to maximize her success with intensive lifestyle modifications for her multiple health conditions.   Objective:   Blood pressure 134/83, pulse 82, temperature 98.4 F (36.9 C), height '5\' 6"'$  (1.676 m), weight 213 lb (96.6 kg), SpO2 98 %. Body mass index is 34.38 kg/m.  General: Cooperative, alert, well developed, in no acute distress. HEENT: Conjunctivae and lids unremarkable. Cardiovascular: Regular rhythm.  Lungs: Normal work of breathing. Neurologic: No focal deficits.   Lab Results  Component Value Date   CREATININE 0.94 08/06/2021   BUN 10 08/06/2021   NA 146 (H) 08/06/2021   K 4.1 08/06/2021   CL 105 08/06/2021   CO2 26 08/06/2021   Lab Results  Component Value Date   ALT 26 08/06/2021   AST 21 08/06/2021   ALKPHOS 80 08/06/2021   BILITOT 0.4 08/06/2021   Lab Results  Component Value Date   HGBA1C 5.8 (H) 08/06/2021   HGBA1C 5.8 (H) 03/13/2021   HGBA1C 5.9 10/16/2020   HGBA1C 5.7 (H) 05/15/2020   HGBA1C 5.7 (H) 12/14/2019   Lab Results  Component Value Date   INSULIN 16.3 08/06/2021   INSULIN 12.4 05/15/2020   INSULIN 14.9 12/14/2019   INSULIN 11.2 07/13/2019   INSULIN 17.4 01/26/2019   Lab Results  Component Value Date   TSH 1.310 10/07/2018   Lab Results  Component Value Date   CHOL 158 12/14/2019   HDL 48 12/14/2019   LDLCALC 94 12/14/2019   TRIG 82 12/14/2019   Lab Results  Component Value Date   VD25OH 61.7 08/06/2021   VD25OH 50.1 03/13/2021   VD25OH 34.5 10/16/2020    Lab Results  Component Value Date   WBC 5.9 10/07/2018   HGB 13.5 10/07/2018   HCT 42.9 10/07/2018   MCV 81 10/07/2018   No results found for: "IRON", "TIBC", "FERRITIN"  Attestation Statements:   Reviewed by clinician on day of visit: allergies, medications, problem list, medical history, surgical history, family history, social history, and previous encounter notes.  I, Elnora Morrison, RMA am acting as transcriptionist for Coralie Common, MD.  I have reviewed the above documentation for accuracy and completeness, and I agree with the above. - Coralie Common, MD

## 2022-07-09 ENCOUNTER — Other Ambulatory Visit (HOSPITAL_COMMUNITY): Payer: Self-pay

## 2022-07-10 ENCOUNTER — Ambulatory Visit (INDEPENDENT_AMBULATORY_CARE_PROVIDER_SITE_OTHER): Payer: BC Managed Care – PPO | Admitting: Family Medicine

## 2022-07-10 ENCOUNTER — Other Ambulatory Visit (HOSPITAL_COMMUNITY): Payer: Self-pay

## 2022-07-10 ENCOUNTER — Encounter (INDEPENDENT_AMBULATORY_CARE_PROVIDER_SITE_OTHER): Payer: Self-pay | Admitting: Family Medicine

## 2022-07-10 VITALS — BP 127/81 | HR 71 | Temp 98.1°F | Ht 66.0 in | Wt 215.0 lb

## 2022-07-10 DIAGNOSIS — R7303 Prediabetes: Secondary | ICD-10-CM | POA: Diagnosis not present

## 2022-07-10 DIAGNOSIS — Z6834 Body mass index (BMI) 34.0-34.9, adult: Secondary | ICD-10-CM | POA: Diagnosis not present

## 2022-07-10 DIAGNOSIS — I10 Essential (primary) hypertension: Secondary | ICD-10-CM | POA: Diagnosis not present

## 2022-07-10 DIAGNOSIS — E669 Obesity, unspecified: Secondary | ICD-10-CM

## 2022-07-10 MED ORDER — CHLORTHALIDONE 25 MG PO TABS
12.5000 mg | ORAL_TABLET | Freq: Every day | ORAL | 0 refills | Status: DC
  Filled 2022-07-10: qty 45, 90d supply, fill #0

## 2022-07-10 MED ORDER — SEMAGLUTIDE-WEIGHT MANAGEMENT 2.4 MG/0.75ML ~~LOC~~ SOAJ
2.4000 mg | SUBCUTANEOUS | 0 refills | Status: DC
  Filled 2022-07-10: qty 3, 28d supply, fill #0
  Filled 2022-10-28: qty 3, 28d supply, fill #1

## 2022-07-10 MED ORDER — SEMAGLUTIDE-WEIGHT MANAGEMENT 2.4 MG/0.75ML ~~LOC~~ SOAJ
2.4000 mg | SUBCUTANEOUS | 2 refills | Status: DC
  Filled 2022-07-10: qty 3, 28d supply, fill #0

## 2022-07-10 NOTE — Progress Notes (Deleted)
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  One of her friends convinced patient to do a step and dance class which is starting next week.  She has been doing pescatarian plan most of the time but has occasionally added in pot roast due to cravings.  She has been doing crab cakes, fish. She has been unfortunately not been able to get all the medication of wegovy that prescribed.  Spring break is last week of March before Easter.

## 2022-07-11 ENCOUNTER — Other Ambulatory Visit (HOSPITAL_COMMUNITY): Payer: Self-pay

## 2022-07-12 ENCOUNTER — Other Ambulatory Visit (HOSPITAL_COMMUNITY): Payer: Self-pay

## 2022-07-15 ENCOUNTER — Encounter (INDEPENDENT_AMBULATORY_CARE_PROVIDER_SITE_OTHER): Payer: Self-pay | Admitting: Family Medicine

## 2022-07-15 NOTE — Telephone Encounter (Signed)
Will check with Dr Jeani Sow about what was discussed prev

## 2022-07-18 ENCOUNTER — Other Ambulatory Visit (HOSPITAL_COMMUNITY): Payer: Self-pay

## 2022-07-18 NOTE — Progress Notes (Signed)
Chief Complaint:   OBESITY Mackenzie Watson is here to discuss her progress with her obesity treatment plan along with follow-up of her obesity related diagnoses. Mackenzie Watson is on the pescatarian plan and states she is following her eating plan approximately 75% of the time. Mackenzie Watson states she is doing wall Pilates for 15-20 minutes 3 times per week.  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  One of her friends convinced patient to do a step and dance class which is starting next week.  She has been doing pescatarian plan most of the time but has occasionally added in pot roast due to cravings.  She has been doing crab cakes, fish. She has been unfortunately not been able to get all the medication of wegovy that prescribed.  Spring break is last week of March before Easter.   Subjective:   1. Essential hypertension Blood pressure controlled. No chest pain, chest pressure, headache.  2. Prediabetes A1c last done 08/06/2021. On GLP-1.   Assessment/Plan:   1. Essential hypertension Refill: - chlorthalidone (HYGROTON) 25 MG tablet; Take 1/2 tablet (12.5 mg total) by mouth daily.  Dispense: 90 tablet; Refill: 0  2. Prediabetes Continue GLP-1.  3. BMI 34.0-34.9,adult Obesity with starting BMI of 40.2 Mackenzie Watson is currently in the action stage of change. As such, her goal is to continue with weight loss efforts. She has agreed to the pescatarian plan.  Exercise goals: All adults should avoid inactivity. Some physical activity is better than none, and adults who participate in any amount of physical activity gain some health  benefits.  Behavioral modification strategies: increasing lean protein intake, meal planning and cooking strategies, keeping healthy foods in the home, and planning for success.  Mackenzie Watson has agreed to follow-up with our clinic in 5 weeks. She was informed of the importance of frequent follow-up visits to maximize her success with intensive lifestyle modifications for her multiple health conditions.    Objective:   Blood pressure 127/81, pulse 71, temperature 98.1 F (36.7 C), height '5\' 6"'$  (1.676 m), weight 215 lb (97.5 kg), SpO2 98 %. Body mass index is 34.7 kg/m.  General: Cooperative, alert, well developed, in no acute distress. HEENT: Conjunctivae and lids unremarkable. Cardiovascular: Regular rhythm.  Lungs: Normal work of breathing. Neurologic: No focal deficits.   Lab Results  Component Value Date   CREATININE 0.94 08/06/2021   BUN 10 08/06/2021   NA 146 (H) 08/06/2021   K 4.1 08/06/2021   CL 105 08/06/2021   CO2 26 08/06/2021   Lab Results  Component Value Date   ALT 26 08/06/2021   AST 21 08/06/2021   ALKPHOS 80 08/06/2021   BILITOT 0.4 08/06/2021   Lab Results  Component Value Date   HGBA1C 5.8 (H) 08/06/2021   HGBA1C 5.8 (H) 03/13/2021   HGBA1C 5.9 10/16/2020   HGBA1C 5.7 (H) 05/15/2020   HGBA1C 5.7 (H) 12/14/2019   Lab Results  Component Value Date   INSULIN 16.3 08/06/2021   INSULIN 12.4 05/15/2020   INSULIN 14.9 12/14/2019   INSULIN 11.2 07/13/2019   INSULIN 17.4 01/26/2019   Lab Results  Component Value Date   TSH 1.310 10/07/2018   Lab Results  Component Value Date   CHOL 158 12/14/2019   HDL 48 12/14/2019   LDLCALC 94 12/14/2019   TRIG 82 12/14/2019   Lab Results  Component Value Date   VD25OH 61.7 08/06/2021   VD25OH 50.1 03/13/2021   VD25OH 34.5 10/16/2020   Lab Results  Component Value Date   WBC 5.9 10/07/2018   HGB 13.5 10/07/2018   HCT 42.9 10/07/2018   MCV 81 10/07/2018   No results found for: "IRON", "TIBC",  "FERRITIN"  Attestation Statements:   Reviewed by clinician on day of visit: allergies, medications, problem list, medical history, surgical history, family history, social history, and previous encounter notes.  I, Dawn Whitmire, FNP-C, am acting as Location manager for Coralie Common, MD.  I have reviewed the above documentation for accuracy and completeness, and I agree with the above. - Coralie Common, MD

## 2022-08-12 ENCOUNTER — Ambulatory Visit (INDEPENDENT_AMBULATORY_CARE_PROVIDER_SITE_OTHER): Payer: BC Managed Care – PPO | Admitting: Family Medicine

## 2022-08-12 ENCOUNTER — Encounter (INDEPENDENT_AMBULATORY_CARE_PROVIDER_SITE_OTHER): Payer: Self-pay | Admitting: Family Medicine

## 2022-08-12 VITALS — BP 130/90 | HR 72 | Temp 97.5°F | Ht 66.0 in | Wt 220.0 lb

## 2022-08-12 DIAGNOSIS — Z6835 Body mass index (BMI) 35.0-35.9, adult: Secondary | ICD-10-CM

## 2022-08-12 DIAGNOSIS — R7303 Prediabetes: Secondary | ICD-10-CM | POA: Diagnosis not present

## 2022-08-12 DIAGNOSIS — I10 Essential (primary) hypertension: Secondary | ICD-10-CM

## 2022-08-12 DIAGNOSIS — E669 Obesity, unspecified: Secondary | ICD-10-CM | POA: Diagnosis not present

## 2022-08-12 NOTE — Progress Notes (Unsigned)
Chief Complaint:   OBESITY Mackenzie Watson is here to discuss her progress with her obesity treatment plan along with follow-up of her obesity related diagnoses. Mackenzie Watson is on the [employer] and states she is following her eating plan approximately 75% of the time. Mackenzie Watson states she is walking, floor piliates and dancing 30-45 minutes 2 times per week.  Today's visit was #: 54 Starting weight: 249 lbs Starting date: 10/07/2018 Today's weight: 220 lbs Today's date: 08/12/2022 Total lbs lost to date: 29 LBS Total lbs lost since last in-office visit: +5 LBS  Interim History: Patient fell when exercising and hurt her shoulder and now has been alternating ice while in a sling.  She is going back to get it checked tomorrow.  She is trying to be careful now and has been hesitant to exercise.  She enjoyed her spring break prior to Easter. She is still following the pescatarian plan and is trying to detox from sugar.  She has been eating sweet potato or potato more frequently.    She thought she was getting all the protein in off plan.   Subjective:   1. Essential hypertension Patient blood pressure elevated on automatic blood pressure cuff but improved on manual reading.  Patient denies chest pain, chest pressure, headache.  2. Prediabetes Patient last A1c was 5.8, insulin 16.3 in early April 2023.  Patient was on Wegov[employer] previously but insurance stopped covering it.    Assessment/Plan:   1. Essential hypertension Continue chlorthalidone at current dose.  No change in medication.  2. Prediabetes Recheck A1c and insulin level in August.  3. BMI 35.0-35.9,adult  4. Obesity with starting BMI of 40.2 Mackenzie Watson is currently in the action stage of change. As such, her goal is to continue with weight loss efforts. She has agreed to the [employer].   Exercise goals: All adults should avoid inactivity. Some physical activity is better than none, and adults who participate in any amount  of physical activity gain some health benefits.  Behavioral modification strategies: increasing lean protein intake, meal planning and cooking strategies, keeping healthy foods in the home, and planning for success.  Mackenzie Watson has agreed to follow-up with our clinic in 3-4 weeks. She was informed of the importance of frequent follow-up visits to maximize her success with intensive lifestyle modifications for her multiple health conditions.   Objective:   Blood pressure (!) 130/90, pulse 72, temperature (!) 97.5 F (36.4 C), height 5\' 6"  (1.676 m), weight 220 lb (99.8 kg), SpO2 99 %. Body mass index is 35.51 kg/m.  General: Cooperative, alert, well developed, in no acute distress. HEENT: Conjunctivae and lids unremarkable. Cardiovascular: Regular rhythm.  Lungs: Normal work of breathing. Neurologic: No focal deficits.   Lab Results  Component Value Date   CREATININE 0.94 08/06/2021   BUN 10 08/06/2021   NA 146 (H) 08/06/2021   K 4.1 08/06/2021   CL 105 08/06/2021   CO2 26 08/06/2021   Lab Results  Component Value Date   ALT 26 08/06/2021   AST 21 08/06/2021   ALKPHOS 80 08/06/2021   BILITOT 0.4 08/06/2021   Lab Results  Component Value Date   HGBA1C 5.8 (H) 08/06/2021   HGBA1C 5.8 (H) 03/13/2021   HGBA1C 5.9 10/16/2020   HGBA1C 5.7 (H) 05/15/2020   HGBA1C 5.7 (H) 12/14/2019   Lab Results  Component Value Date   INSULIN 16.3 08/06/2021   INSULIN 12.4 05/15/2020   INSULIN 14.9 12/14/2019   INSULIN 11.2 07/13/2019  INSULIN 17.4 01/26/2019   Lab Results  Component Value Date   TSH 1.310 10/07/2018   Lab Results  Component Value Date   CHOL 158 12/14/2019   HDL 48 12/14/2019   LDLCALC 94 12/14/2019   TRIG 82 12/14/2019   Lab Results  Component Value Date   VD25OH 61.7 08/06/2021   VD25OH 50.1 03/13/2021   VD25OH 34.5 10/16/2020   Lab Results  Component Value Date   WBC 5.9 10/07/2018   HGB 13.5 10/07/2018   HCT 42.9 10/07/2018   MCV 81 10/07/2018    No results found for: "IRON", "TIBC", "FERRITIN"  Attestation Statements:   Reviewed by clinician on day of visit: allergies, medications, problem list, medical history, surgical history, family history, social history, and previous encounter notes.  I, Malcolm Metro, RMA, am acting as transcriptionist for Reuben Likes, MD.  I have reviewed the above documentation for accuracy and completeness, and I agree with the above. - Reuben Likes, MD

## 2022-08-22 ENCOUNTER — Other Ambulatory Visit (HOSPITAL_COMMUNITY): Payer: Self-pay

## 2022-09-09 ENCOUNTER — Ambulatory Visit (INDEPENDENT_AMBULATORY_CARE_PROVIDER_SITE_OTHER): Payer: BC Managed Care – PPO | Admitting: Family Medicine

## 2022-09-24 ENCOUNTER — Other Ambulatory Visit (HOSPITAL_COMMUNITY): Payer: Self-pay

## 2022-09-27 ENCOUNTER — Other Ambulatory Visit (HOSPITAL_COMMUNITY): Payer: Self-pay

## 2022-10-01 ENCOUNTER — Other Ambulatory Visit (HOSPITAL_COMMUNITY): Payer: Self-pay

## 2022-10-01 ENCOUNTER — Encounter (INDEPENDENT_AMBULATORY_CARE_PROVIDER_SITE_OTHER): Payer: Self-pay | Admitting: Family Medicine

## 2022-10-01 ENCOUNTER — Ambulatory Visit (INDEPENDENT_AMBULATORY_CARE_PROVIDER_SITE_OTHER): Payer: BC Managed Care – PPO | Admitting: Family Medicine

## 2022-10-01 VITALS — BP 116/81 | HR 71 | Temp 98.6°F | Ht 66.0 in | Wt 225.0 lb

## 2022-10-01 DIAGNOSIS — E669 Obesity, unspecified: Secondary | ICD-10-CM | POA: Diagnosis not present

## 2022-10-01 DIAGNOSIS — Z6836 Body mass index (BMI) 36.0-36.9, adult: Secondary | ICD-10-CM

## 2022-10-01 DIAGNOSIS — R7303 Prediabetes: Secondary | ICD-10-CM | POA: Diagnosis not present

## 2022-10-01 DIAGNOSIS — I10 Essential (primary) hypertension: Secondary | ICD-10-CM | POA: Diagnosis not present

## 2022-10-01 MED ORDER — CHLORTHALIDONE 25 MG PO TABS
12.5000 mg | ORAL_TABLET | Freq: Every day | ORAL | 0 refills | Status: DC
  Filled 2022-10-01 – 2022-10-21 (×2): qty 45, 90d supply, fill #0

## 2022-10-01 NOTE — Progress Notes (Signed)
Chief Complaint:   OBESITY Mackenzie Watson is here to discuss her progress with her obesity treatment plan along with follow-up of her obesity related diagnoses. Mackenzie Watson is on the [employer] and states she is following her eating plan approximately 75% of the time. Mackenzie Watson states she is walking for 30-45 minutes 3-4 times per week.  Today's visit was #: 55 Starting weight: 249 lbs Starting date: 10/07/2018 Today's weight: 225 lbs Today's date: 10/01/2022 Total lbs lost to date: 24 Total lbs lost since last in-office visit: 0  Interim History: Patient fell at school and just found out her rotator cuff is torn and she got cortisone shot in the shoulder 4-5 days ago.  She feels like she will likely need surgery due to significantly decreased range of motion and strength in her left arm now.  She is anxiously anticipating school ending so she can focus on her shoulder and healing.  School is done June 14.  Still doing Pescatarian but realizes she has eaten more carbohydrate over her normal amount recently.  Feels like her water intake has been low as well.    Subjective:   1. Essential hypertension Patient's blood pressure is very well-controlled today.  She denies chest pain, chest pressure, or headaches.  2. Prediabetes Patient's last A1c was 5.8 and insulin level 16.3.  She was previously on [employer] but her insurance is no longer covering this.  Assessment/Plan:   1. Essential hypertension We will refill chlorthalidone 12.5 mg once daily for 90 days.  - chlorthalidone (HYGROTON) 25 MG tablet; Take 1/2 tablet (12.5 mg total) by mouth daily.  Dispense: 90 tablet; Refill: 0  2. Prediabetes We will repeat labs in August or September.  3. BMI 36.0-36.9,adult  4. Obesity with starting BMI of 40.2 Mackenzie Watson is currently in the action stage of change. As such, her goal is to continue with weight loss efforts. She has agreed to the [employer].   Patient is to continue [facility] pens  that she has left, and she will MyChart me.  May want to consider Saxenda if generic is affordable.  Exercise goals: All adults should avoid inactivity. Some physical activity is better than none, and adults who participate in any amount of physical activity gain some health benefits.  Behavioral modification strategies: increasing lean protein intake, meal planning and cooking strategies, keeping healthy foods in the home, and planning for success.  Mackenzie Watson has agreed to follow-up with our clinic in 4 weeks. She was informed of the importance of frequent follow-up visits to maximize her success with intensive lifestyle modifications for her multiple health conditions.   Objective:   Blood pressure 116/81, pulse 71, temperature 98.6 F (37 C), height 5\' 6"  (1.676 m), weight 225 lb (102.1 kg), SpO2 98 %. Body mass index is 36.32 kg/m.  General: Cooperative, alert, well developed, in no acute distress. HEENT: Conjunctivae and lids unremarkable. Cardiovascular: Regular rhythm.  Lungs: Normal work of breathing. Neurologic: No focal deficits.   Lab Results  Component Value Date   CREATININE 0.94 08/06/2021   BUN 10 08/06/2021   NA 146 (H) 08/06/2021   K 4.1 08/06/2021   CL 105 08/06/2021   CO2 26 08/06/2021   Lab Results  Component Value Date   ALT 26 08/06/2021   AST 21 08/06/2021   ALKPHOS 80 08/06/2021   BILITOT 0.4 08/06/2021   Lab Results  Component Value Date   HGBA1C 5.8 (H) 08/06/2021   HGBA1C 5.8 (H) 03/13/2021   HGBA1C  5.9 10/16/2020   HGBA1C 5.7 (H) 05/15/2020   HGBA1C 5.7 (H) 12/14/2019   Lab Results  Component Value Date   INSULIN 16.3 08/06/2021   INSULIN 12.4 05/15/2020   INSULIN 14.9 12/14/2019   INSULIN 11.2 07/13/2019   INSULIN 17.4 01/26/2019   Lab Results  Component Value Date   TSH 1.310 10/07/2018   Lab Results  Component Value Date   CHOL 158 12/14/2019   HDL 48 12/14/2019   LDLCALC 94 12/14/2019   TRIG 82 12/14/2019   Lab Results   Component Value Date   VD25OH 61.7 08/06/2021   VD25OH 50.1 03/13/2021   VD25OH 34.5 10/16/2020   Lab Results  Component Value Date   WBC 5.9 10/07/2018   HGB 13.5 10/07/2018   HCT 42.9 10/07/2018   MCV 81 10/07/2018   No results found for: "IRON", "TIBC", "FERRITIN"  Attestation Statements:   Reviewed by clinician on day of visit: allergies, medications, problem list, medical history, surgical history, family history, social history, and previous encounter notes.   I, Burt Knack, am acting as transcriptionist for Reuben Likes, MD. I have reviewed the above documentation for accuracy and completeness, and I agree with the above. - Reuben Likes, MD

## 2022-10-02 NOTE — Telephone Encounter (Signed)
Please advise 

## 2022-10-15 ENCOUNTER — Other Ambulatory Visit (HOSPITAL_COMMUNITY): Payer: Self-pay

## 2022-10-21 ENCOUNTER — Other Ambulatory Visit (HOSPITAL_COMMUNITY): Payer: Self-pay

## 2022-10-28 ENCOUNTER — Other Ambulatory Visit (HOSPITAL_COMMUNITY): Payer: Self-pay

## 2022-10-31 ENCOUNTER — Other Ambulatory Visit (HOSPITAL_COMMUNITY): Payer: Self-pay

## 2022-11-10 ENCOUNTER — Encounter (INDEPENDENT_AMBULATORY_CARE_PROVIDER_SITE_OTHER): Payer: Self-pay

## 2022-11-11 ENCOUNTER — Encounter (INDEPENDENT_AMBULATORY_CARE_PROVIDER_SITE_OTHER): Payer: Self-pay | Admitting: Family Medicine

## 2022-11-11 ENCOUNTER — Telehealth (INDEPENDENT_AMBULATORY_CARE_PROVIDER_SITE_OTHER): Payer: BC Managed Care – PPO | Admitting: Family Medicine

## 2022-11-11 VITALS — BP 130/78 | HR 81 | Temp 98.2°F | Ht 66.0 in | Wt 229.0 lb

## 2022-11-11 DIAGNOSIS — E669 Obesity, unspecified: Secondary | ICD-10-CM

## 2022-11-11 DIAGNOSIS — R7303 Prediabetes: Secondary | ICD-10-CM | POA: Diagnosis not present

## 2022-11-11 DIAGNOSIS — I10 Essential (primary) hypertension: Secondary | ICD-10-CM | POA: Diagnosis not present

## 2022-11-11 DIAGNOSIS — Z6837 Body mass index (BMI) 37.0-37.9, adult: Secondary | ICD-10-CM | POA: Diagnosis not present

## 2022-11-11 NOTE — Progress Notes (Signed)
TeleHealth Visit:  Due to the COVID-19 pandemic, this visit was completed with telemedicine (audio/video) technology to reduce patient and provider exposure as well as to preserve personal protective equipment.   Mackenzie Watson has verbally consented to this TeleHealth visit. The patient is located at home, the provider is located at the [facility] office. The participants in this visit include the listed provider and patient. The visit was conducted today via MyChart video.  Chief Complaint: OBESITY Mackenzie Watson is here to discuss her progress with her obesity treatment plan along with follow-up of her obesity related diagnoses. Mackenzie Watson is on the [employer] and states she is following her eating plan approximately 60% of the time. Mackenzie Watson states she is walking for 30-45 minutes 2-3 times per week.  Today's visit was #: 56 Starting weight: 249 lbs Starting date: 10/07/2018 Today's weight: 229 lbs Today's date: 11/11/2022 Total lbs lost to date: 20 Total lbs lost since last in-office visit: 0  Interim History: Patient resting quite a bit over the summer with school being out. She is still struggling with her shoulder pain and that is really impacting her sleep and daily activity.  Last appointment she had with ortho surgery was on the table as an option for further management. She is still waiting to hear from her insurance company to make decision whether or not they will pay for surgery.  She has been napping more throughout the day to make up for the sleep she isn't getting overnight.  She has had more celebratory eating recently and really wants to get more on plan for the upcoming few weeks.  No upcoming plans for the rest of July.  She is wanting to recommit to Pescatarian and has planned out her meals for the rest of the week.  Wants to increase her water intake as well. Biggest obstacles in the next few weeks will be controlling carb indulgences as she is more sedentary.    Subjective:   1. Prediabetes Patient's last A1c was 5.8 on last check (same as prior).  Previously on GLP-1 [facility]); significant carbohydrate cravings recently.  Dealing with shoulder injury.  2. Essential hypertension Patient's blood pressure is on the higher end of normal today.  She denies chest pain, chest pressure, or headache.  Assessment/Plan:   1. Prediabetes We will repeat labs in October 2024.  No change in treatment.  Patient is to continue her pescatarian plan.  2. Essential hypertension Patient will continue her current medications at same dose with no change in treatment.  3. BMI 37.0-37.9, adult  4. Obesity with starting BMI of 40.2 Mackenzie Watson is currently in the action stage of change. As such, her goal is to continue with weight loss efforts. She has agreed to the [employer].   Exercise goals: All adults should avoid inactivity. Some physical activity is better than none, and adults who participate in any amount of physical activity gain some health benefits.  Behavioral modification strategies: increasing lean protein intake, meal planning and cooking strategies, keeping healthy foods in the home, and planning for success.  Mackenzie Watson has agreed to follow-up with our clinic in 4 weeks. She was informed of the importance of frequent follow-up visits to maximize her success with intensive lifestyle modifications for her multiple health conditions.  Objective:   VITALS: Per patient if applicable, see vitals. GENERAL: Alert and in no acute distress. CARDIOPULMONARY: No increased WOB. Speaking in clear sentences.  PSYCH: Pleasant and cooperative. Speech normal rate and rhythm. Affect  is appropriate. Insight and judgement are appropriate. Attention is focused, linear, and appropriate.  NEURO: Oriented as arrived to appointment on time with no prompting.   Lab Results  Component Value Date   CREATININE 0.94 08/06/2021   BUN 10 08/06/2021   NA 146 (H) 08/06/2021    K 4.1 08/06/2021   CL 105 08/06/2021   CO2 26 08/06/2021   Lab Results  Component Value Date   ALT 26 08/06/2021   AST 21 08/06/2021   ALKPHOS 80 08/06/2021   BILITOT 0.4 08/06/2021   Lab Results  Component Value Date   HGBA1C 5.8 (H) 08/06/2021   HGBA1C 5.8 (H) 03/13/2021   HGBA1C 5.9 10/16/2020   HGBA1C 5.7 (H) 05/15/2020   HGBA1C 5.7 (H) 12/14/2019   Lab Results  Component Value Date   INSULIN 16.3 08/06/2021   INSULIN 12.4 05/15/2020   INSULIN 14.9 12/14/2019   INSULIN 11.2 07/13/2019   INSULIN 17.4 01/26/2019   Lab Results  Component Value Date   TSH 1.310 10/07/2018   Lab Results  Component Value Date   CHOL 158 12/14/2019   HDL 48 12/14/2019   LDLCALC 94 12/14/2019   TRIG 82 12/14/2019   Lab Results  Component Value Date   VD25OH 61.7 08/06/2021   VD25OH 50.1 03/13/2021   VD25OH 34.5 10/16/2020   Lab Results  Component Value Date   WBC 5.9 10/07/2018   HGB 13.5 10/07/2018   HCT 42.9 10/07/2018   MCV 81 10/07/2018   No results found for: "IRON", "TIBC", "FERRITIN"  Attestation Statements:   Reviewed by clinician on day of visit: allergies, medications, problem list, medical history, surgical history, family history, social history, and previous encounter notes.   I, Burt Knack, am acting as transcriptionist for Reuben Likes, MD.  I have reviewed the above documentation for accuracy and completeness, and I agree with the above. - Reuben Likes, MD

## 2022-12-09 ENCOUNTER — Ambulatory Visit (INDEPENDENT_AMBULATORY_CARE_PROVIDER_SITE_OTHER): Payer: BC Managed Care – PPO | Admitting: Family Medicine

## 2022-12-09 ENCOUNTER — Encounter (INDEPENDENT_AMBULATORY_CARE_PROVIDER_SITE_OTHER): Payer: Self-pay | Admitting: Family Medicine

## 2022-12-09 ENCOUNTER — Other Ambulatory Visit (HOSPITAL_COMMUNITY): Payer: Self-pay

## 2022-12-09 VITALS — BP 129/86 | HR 94 | Temp 98.0°F | Ht 66.0 in | Wt 230.0 lb

## 2022-12-09 DIAGNOSIS — I10 Essential (primary) hypertension: Secondary | ICD-10-CM

## 2022-12-09 DIAGNOSIS — E669 Obesity, unspecified: Secondary | ICD-10-CM

## 2022-12-09 DIAGNOSIS — R7303 Prediabetes: Secondary | ICD-10-CM | POA: Diagnosis not present

## 2022-12-09 DIAGNOSIS — Z6837 Body mass index (BMI) 37.0-37.9, adult: Secondary | ICD-10-CM

## 2022-12-09 MED ORDER — CHLORTHALIDONE 25 MG PO TABS
12.5000 mg | ORAL_TABLET | Freq: Every day | ORAL | 0 refills | Status: DC
  Filled 2022-12-09: qty 90, 180d supply, fill #0
  Filled 2023-01-27 – 2023-02-06 (×2): qty 45, 90d supply, fill #0
  Filled 2023-05-01: qty 45, 90d supply, fill #1

## 2022-12-09 NOTE — Progress Notes (Signed)
Chief Complaint:   OBESITY Mackenzie Watson is here to discuss her progress with her obesity treatment plan along with follow-up of her obesity related diagnoses. Mackenzie Watson is on the [employer] and states she is following her eating plan approximately 75% of the time. Mackenzie Watson states she is walking for 30-45 minutes 3 times per week.  Today's visit was #: 57 Starting weight: 249 lbs Starting date: 10/07/2018 Today's weight: 230 lbs Today's date: 12/09/2022 Total lbs lost to date: 19 Total lbs lost since last in-office visit: 0  Interim History: Patient is scheduled to get shoulder surgery August 20th currently.  She is ready to get some relief of the pain she is currently experiencing. She mentions that she is tossing and turning quite a bit at night and isn't sleeping well due to pain. She is still doing the pescatarian plan.  Every once in a while she may want to eat some chicken or some beef but for the most part is doing pescatarian.  Moving forward she wants to stick as closely to the plan as she can be.   Subjective:   1. Essential hypertension Patient's blood pressure is controlled today.  She denies chest pain, chest pressure, or headache.  2. Prediabetes Patient's last A1c was 5.8 on her last check.  She was previously on GLP-1.  Assessment/Plan:   1. Essential hypertension Patient will continue chlorthalidone 12.5 mg once daily, and we will refill for 90 days.  - chlorthalidone (HYGROTON) 25 MG tablet; Take 1/2 tablet (12.5 mg total) by mouth daily.  Dispense: 90 tablet; Refill: 0  2. Prediabetes We will repeat labs at patient's next appointment in early October.  3. BMI 37.0-37.9, adult  4. Obesity with starting BMI of 40.2 Mackenzie Watson is currently in the action stage of change. As such, her goal is to continue with weight loss efforts. She has agreed to the [employer].   We will repeat fasting labs and IC at her next appointment.   Exercise goals: As is.    Behavioral modification strategies: increasing lean protein intake, meal planning and cooking strategies, keeping healthy foods in the home, and planning for success.  Mackenzie Watson has agreed to follow-up with our clinic in 7 weeks. She was informed of the importance of frequent follow-up visits to maximize her success with intensive lifestyle modifications for her multiple health conditions.   Objective:   Blood pressure 129/86, pulse 94, temperature 98 F (36.7 C), height 5\' 6"  (1.676 m), weight 230 lb (104.3 kg), SpO2 98%. Body mass index is 37.12 kg/m.  General: Cooperative, alert, well developed, in no acute distress. HEENT: Conjunctivae and lids unremarkable. Cardiovascular: Regular rhythm.  Lungs: Normal work of breathing. Neurologic: No focal deficits.   Lab Results  Component Value Date   CREATININE 0.94 08/06/2021   BUN 10 08/06/2021   NA 146 (H) 08/06/2021   K 4.1 08/06/2021   CL 105 08/06/2021   CO2 26 08/06/2021   Lab Results  Component Value Date   ALT 26 08/06/2021   AST 21 08/06/2021   ALKPHOS 80 08/06/2021   BILITOT 0.4 08/06/2021   Lab Results  Component Value Date   HGBA1C 5.8 (H) 08/06/2021   HGBA1C 5.8 (H) 03/13/2021   HGBA1C 5.9 10/16/2020   HGBA1C 5.7 (H) 05/15/2020   HGBA1C 5.7 (H) 12/14/2019   Lab Results  Component Value Date   INSULIN 16.3 08/06/2021   INSULIN 12.4 05/15/2020   INSULIN 14.9 12/14/2019   INSULIN 11.2 07/13/2019  INSULIN 17.4 01/26/2019   Lab Results  Component Value Date   TSH 1.310 10/07/2018   Lab Results  Component Value Date   CHOL 158 12/14/2019   HDL 48 12/14/2019   LDLCALC 94 12/14/2019   TRIG 82 12/14/2019   Lab Results  Component Value Date   VD25OH 61.7 08/06/2021   VD25OH 50.1 03/13/2021   VD25OH 34.5 10/16/2020   Lab Results  Component Value Date   WBC 5.9 10/07/2018   HGB 13.5 10/07/2018   HCT 42.9 10/07/2018   MCV 81 10/07/2018   No results found for: "IRON", "TIBC",  "FERRITIN"  Attestation Statements:   Reviewed by clinician on day of visit: allergies, medications, problem list, medical history, surgical history, family history, social history, and previous encounter notes.  Time spent on visit including pre-visit chart review and post-visit care and charting was 30 minutes.   I, Burt Knack, am acting as transcriptionist for Reuben Likes, MD. I have reviewed the above documentation for accuracy and completeness, and I agree with the above. - Reuben Likes, MD

## 2023-01-27 ENCOUNTER — Other Ambulatory Visit (HOSPITAL_COMMUNITY): Payer: Self-pay

## 2023-02-03 ENCOUNTER — Ambulatory Visit (INDEPENDENT_AMBULATORY_CARE_PROVIDER_SITE_OTHER): Payer: BC Managed Care – PPO | Admitting: Family Medicine

## 2023-02-04 ENCOUNTER — Other Ambulatory Visit (HOSPITAL_COMMUNITY): Payer: Self-pay

## 2023-02-04 ENCOUNTER — Other Ambulatory Visit: Payer: Self-pay | Admitting: Family Medicine

## 2023-02-04 ENCOUNTER — Other Ambulatory Visit: Payer: Self-pay

## 2023-02-06 ENCOUNTER — Encounter (INDEPENDENT_AMBULATORY_CARE_PROVIDER_SITE_OTHER): Payer: Self-pay | Admitting: Internal Medicine

## 2023-02-06 ENCOUNTER — Other Ambulatory Visit (HOSPITAL_COMMUNITY): Payer: Self-pay

## 2023-02-06 ENCOUNTER — Other Ambulatory Visit: Payer: Self-pay

## 2023-02-06 ENCOUNTER — Ambulatory Visit (INDEPENDENT_AMBULATORY_CARE_PROVIDER_SITE_OTHER): Payer: BC Managed Care – PPO | Admitting: Internal Medicine

## 2023-02-06 VITALS — BP 130/84 | HR 92 | Temp 98.0°F | Ht 66.0 in | Wt 245.0 lb

## 2023-02-06 DIAGNOSIS — R638 Other symptoms and signs concerning food and fluid intake: Secondary | ICD-10-CM

## 2023-02-06 DIAGNOSIS — E66813 Obesity, class 3: Secondary | ICD-10-CM

## 2023-02-06 DIAGNOSIS — R632 Polyphagia: Secondary | ICD-10-CM | POA: Diagnosis not present

## 2023-02-06 DIAGNOSIS — E559 Vitamin D deficiency, unspecified: Secondary | ICD-10-CM

## 2023-02-06 DIAGNOSIS — Z6841 Body Mass Index (BMI) 40.0 and over, adult: Secondary | ICD-10-CM

## 2023-02-06 DIAGNOSIS — R0602 Shortness of breath: Secondary | ICD-10-CM | POA: Diagnosis not present

## 2023-02-06 DIAGNOSIS — R7303 Prediabetes: Secondary | ICD-10-CM | POA: Diagnosis not present

## 2023-02-06 DIAGNOSIS — I10 Essential (primary) hypertension: Secondary | ICD-10-CM

## 2023-02-06 MED ORDER — ESCITALOPRAM OXALATE 10 MG PO TABS
10.0000 mg | ORAL_TABLET | Freq: Every day | ORAL | 1 refills | Status: DC
  Filled 2023-02-06 (×2): qty 30, 30d supply, fill #0

## 2023-02-06 NOTE — Progress Notes (Signed)
Office: [phone]  /  Fax: [phone]  WEIGHT SUMMARY AND BIOMETRICS  Vitals Temp: 98 F (36.7 C) BP: 130/84 Pulse Rate: 92 SpO2: 98 %   Anthropometric Measurements Height: 5\' 6"  (1.676 m) Weight: 245 lb (111.1 kg) BMI (Calculated): 39.56 Weight at Last Visit: 230 lb Weight Lost Since Last Visit: 0 lb Weight Gained Since Last Visit: 15 lb Starting Weight: 249 lb Total Weight Loss (lbs): 0 lb (0 kg)   Body Composition  Body Fat %: 47.2 % Fat Mass (lbs): 122.8 lbs Muscle Mass (lbs): 122.8 lbs Total Body Water (lbs): 88.8 lbs Visceral Fat Rating : 14    RMR: [id number]  Today's Visit #: 20  Starting Date: 10/07/18    Chief Complaint: OBESITY  HPI  Discussed the use of AI scribe software for clinical note transcription with the patient, who gave verbal consent to proceed.  History of Present Illness   This is my first encounter with patient . She is a Runner, broadcasting/film/video with a history of prediabetes and anxiety, presents with a significant weight gain of 35 pounds over the past year. The patient's weight had previously been on a downward trend, reaching a low of 210 pounds in August of 2023. However, since then, her weight has been steadily increasing, currently at 245 pounds.  The patient attributes the weight gain to several factors. Firstly, she was on Wegovy, a medication that assisted with weight loss, but had to discontinue it due to insurance coverage issues. Secondly, she underwent rotator cuff surgery, which has limited her ability to exercise and has led to a sedentary lifestyle. The patient is currently in rehabilitation for the surgery and plans to resume regular exercise once fully recovered.  The patient also acknowledges dietary habits as a contributing factor to the weight gain. She follows a pescetarian diet about 50% of the time but admits to having a weakness for sweets and comfort foods, particularly during times of stress. The patient has been trying to  prepare meals in advance to stay on track but has found it challenging due to the stress and time constraints associated with preparing for surgery and dealing with insurance and worker's compensation issues.  The patient is currently on Lexapro for anxiety and has been taking vitamin D supplements. She has previously tried Qsymia for weight loss but did not find it as effective as [id number]. The patient is open to trying other weight loss medications and is awaiting further consultation with Dr. Lawson Radar  The patient's last physical was in June, and she has not had any recent blood work done outside of the system. The patient's last recorded A1c was 5.8, indicating prediabetes. Her metabolism was tested and found to be higher than predicted, but this result is being questioned due to her rapid weight gain.         Barriers identified: strong hunger signals and appetite and cost of medication.   Pharmacotherapy for weight loss: She is currently taking no anti-obesity medication.    ASSESSMENT AND PLAN  TREATMENT PLAN FOR OBESITY:  Recommended Dietary Goals  Laurencia is currently in the action stage of change. As such, her goal is to continue weight management plan. She has agreed to: continue current plan  Behavioral Intervention  We discussed the following Behavioral Modification Strategies today: increasing lean protein intake, decreasing simple carbohydrates , increasing vegetables, increasing fiber rich foods, increasing water intake, work on meal planning and preparation, continue to practice mindfulness when eating, and planning for success.  Additional  resources provided today: None  Recommended Physical Activity Goals  Jadzia has been advised to work up to 150 minutes of moderate intensity aerobic activity a week and strengthening exercises 2-3 times per week for cardiovascular health, weight loss maintenance and preservation of muscle mass.   She has agreed to :  Think about  ways to increase daily physical activity and overcoming barriers to exercise and Increase physical activity in their day and reduce sedentary time (increase NEAT).  Pharmacotherapy We discussed various medication options to help Janyah with her weight loss efforts and we both agreed to : continue with nutritional and behavioral strategies  ASSOCIATED CONDITIONS ADDRESSED TODAY  SOB (shortness of breath) on exertion Assessment & Plan: Her indirect calorimetry is higher than calculated which goes against patient's progressive weight gain.  Have based her calorie target based on calculated as I feel that her REE may be overestimated.  If patient has physiological hunger and is losing weight rapidly then her calorie target could be increased  Orders: -     CBC With Differential  Prediabetes Assessment & Plan: Most recent A1c is  Lab Results  Component Value Date   HGBA1C 5.8 (H) 02/06/2023   HGBA1C 5.8 (H) 10/07/2018    Patient aware of disease state and risk of progression. This may contribute to abnormal cravings, fatigue and diabetic complications without having diabetes.   We have discussed treatment options which include: losing 7 to 10% of body weight, increasing physical activity to a goal of 150 minutes a week at moderate intensity.  Advised to maintain a diet low on simple and processed carbohydrates.  She had been on Wegovy in the past.  She may benefit from starting metformin for pharmacoprophylaxis and incretin effect.  Topiramate may also be of benefit.  I will defer further discussions on pharmacotherapy to Dr. Lawson Radar   Orders: -     Hemoglobin A1c -     Insulin, random -     Lipid Panel With LDL/HDL Ratio -     VITAMIN D 25 Hydroxy (Vit-D Deficiency, Fractures) -     CMP14+EGFR  Polyphagia -     Escitalopram Oxalate; Take 1 tablet (10 mg total) by mouth daily.  Dispense: 30 tablet; Refill: 1  Morbid obesity (HCC) -     TSH  Vitamin D deficiency Assessment &  Plan: She is currently on high-dose vitamin D supplementation.  Will check vitamin D levels today to make sure levels are within range.   Class 3 severe obesity with serious comorbidity and body mass index (BMI) of 40.0 to 44.9 in adult, unspecified obesity type [facility]) Assessment & Plan:  Since August 2024, she has experienced rapid weight gain, increasing from 210 to 245 pounds. Previously, she successfully lost weight using Wegovy, but discontinued it due to lack of insurance coverage. Her physical activity is limited following rotator cuff surgery and ongoing rehabilitation. She also has prediabetes and exhibits stress eating with a preference for sweets.  She was provided with a 7-day AI generated pescatarian plan that also incorporates eggs and dairy targeting 1300 calories per day and consider meal replacements for convenience and calorie control. Encouragement for resuming physical activity as soon as medically cleared is given.  She will will discuss the potential use of Metformin for prediabetes and as a prerequisite for GLP-1 agonist coverage. Labs to be ordered include A1c, fasting insulin, fasting blood sugar, cholesterol, kidney function, electrolytes, and vitamin D. A follow-up in 3 weeks is scheduled to discuss medication  options.         Essential hypertension Assessment & Plan: Blood pressure at goal for age and risk category.  On chlorthalidone without adverse effects.    Continue with weight loss therapy. Losing 10% may improve blood pressure control. Monitor for symptoms of orthostasis while losing weight. Continue current regimen and home monitoring for a goal blood pressure of 120/80.   Check renal parameters today.    Abnormal food appetite Assessment & Plan: She has increased orexigenic signaling, impaired satiety and inhibitory control. This is secondary to an abnormal energy regulation system and pathological neurohormonal pathways characteristic of excess  adiposity.  In addition to nutritional and behavioral strategies she benefits from pharmacotherapy.  She had been on Wegovy in the past with good results but medication has been discontinued.  She will follow-up with Dr. Lawson Radar to discuss other treatment options    Other orders -     CMP14+EGFR -     CBC with Differential/Platelet -     Lipid Panel With LDL/HDL Ratio -     Hemoglobin A1c -     TSH -     VITAMIN D 25 Hydroxy (Vit-D Deficiency, Fractures) -     Insulin, random    PHYSICAL EXAM:  Blood pressure 130/84, pulse 92, temperature 98 F (36.7 C), height 5\' 6"  (1.676 m), weight 245 lb (111.1 kg), SpO2 98%. Body mass index is 39.54 kg/m.  General: She is overweight, cooperative, alert, well developed, and in no acute distress. PSYCH: Has normal mood, affect and thought process.   HEENT: EOMI, sclerae are anicteric. Lungs: Normal breathing effort, no conversational dyspnea. Extremities: No edema.  Neurologic: No gross sensory or motor deficits. No tremors or fasciculations noted.    DIAGNOSTIC DATA REVIEWED:  BMET    Component Value Date/Time   NA 141 02/06/2023 1139   K 4.0 02/06/2023 1139   CL 103 02/06/2023 1139   CO2 24 02/06/2023 1139   GLUCOSE 86 02/06/2023 1139   BUN 8 02/06/2023 1139   CREATININE 0.90 02/06/2023 1139   CALCIUM 9.5 02/06/2023 1139   GFRNONAA 74 05/15/2020 1626   GFRAA 70 10/16/2020 0000   Lab Results  Component Value Date   HGBA1C 5.8 (H) 02/06/2023   HGBA1C 5.8 (H) 10/07/2018   Lab Results  Component Value Date   INSULIN 18.0 02/06/2023   INSULIN 16.2 10/07/2018   Lab Results  Component Value Date   TSH 2.280 02/06/2023   CBC    Component Value Date/Time   WBC 4.9 02/06/2023 1139   RBC 5.29 (H) 02/06/2023 1139   HGB 13.5 02/06/2023 1139   HCT 44.5 02/06/2023 1139   PLT 202 02/06/2023 1139   MCV 84 02/06/2023 1139   MCH 25.5 (L) 02/06/2023 1139   MCHC 30.3 (L) 02/06/2023 1139   RDW 14.3 02/06/2023 1139   Iron  Studies No results found for: "IRON", "TIBC", "FERRITIN", "IRONPCTSAT" Lipid Panel     Component Value Date/Time   CHOL 195 02/06/2023 1139   TRIG 117 02/06/2023 1139   HDL 56 02/06/2023 1139   LDLCALC 118 (H) 02/06/2023 1139   Hepatic Function Panel     Component Value Date/Time   PROT 7.0 02/06/2023 1139   ALBUMIN 4.0 02/06/2023 1139   AST 17 02/06/2023 1139   ALT 19 02/06/2023 1139   ALKPHOS 74 02/06/2023 1139   BILITOT 0.4 02/06/2023 1139      Component Value Date/Time   TSH 2.280 02/06/2023 1139   Nutritional Lab  Results  Component Value Date   VD25OH 62.9 02/06/2023   VD25OH 61.7 08/06/2021   VD25OH 50.1 03/13/2021     Return in about 3 weeks (around 02/27/2023) for follow-up with Dr. Marquis Lunch in 3 weeks.Marland Kitchen She was informed of the importance of frequent follow up visits to maximize her success with intensive lifestyle modifications for her multiple health conditions.   ATTESTASTION STATEMENTS:  Reviewed by clinician on day of visit: allergies, medications, problem list, medical history, surgical history, family history, social history, and previous encounter notes.   I have spent 40 minutes in the care of the patient today including: preparing to see patient (e.g. review and interpretation of tests, old notes ), obtaining and/or reviewing separately obtained history, performing a medically appropriate examination or evaluation, counseling and educating the patient, ordering medications, test or procedures, documenting clinical information in the electronic or other health care record, and independently interpreting results and communicating results to the patient, family, or caregiver   Worthy Rancher, MD

## 2023-02-07 DIAGNOSIS — R0602 Shortness of breath: Secondary | ICD-10-CM | POA: Insufficient documentation

## 2023-02-07 DIAGNOSIS — R638 Other symptoms and signs concerning food and fluid intake: Secondary | ICD-10-CM | POA: Insufficient documentation

## 2023-02-07 LAB — CMP14+EGFR
ALT: 19 [IU]/L (ref 0–32)
AST: 17 [IU]/L (ref 0–40)
Albumin: 4 g/dL (ref 3.8–4.9)
Alkaline Phosphatase: 74 [IU]/L (ref 44–121)
BUN/Creatinine Ratio: 9 (ref 9–23)
BUN: 8 mg/dL (ref 6–24)
Bilirubin Total: 0.4 mg/dL (ref 0.0–1.2)
CO2: 24 mmol/L (ref 20–29)
Calcium: 9.5 mg/dL (ref 8.7–10.2)
Chloride: 103 mmol/L (ref 96–106)
Creatinine, Ser: 0.9 mg/dL (ref 0.57–1.00)
Globulin, Total: 3 g/dL (ref 1.5–4.5)
Glucose: 86 mg/dL (ref 70–99)
Potassium: 4 mmol/L (ref 3.5–5.2)
Sodium: 141 mmol/L (ref 134–144)
Total Protein: 7 g/dL (ref 6.0–8.5)
eGFR: 77 mL/min/{1.73_m2} (ref >59)

## 2023-02-07 LAB — VITAMIN D 25 HYDROXY (VIT D DEFICIENCY, FRACTURES): Vit D, 25-Hydroxy: 62.9 ng/mL (ref 30.0–100.0)

## 2023-02-07 LAB — LIPID PANEL WITH LDL/HDL RATIO
Cholesterol, Total: 195 mg/dL (ref 100–199)
HDL: 56 mg/dL (ref >39)
LDL Chol Calc (NIH): 118 mg/dL — ABNORMAL HIGH (ref 0–99)
LDL/HDL Ratio: 2.1 {ratio} (ref 0.0–3.2)
Triglycerides: 117 mg/dL (ref 0–149)
VLDL Cholesterol Cal: 21 mg/dL (ref 5–40)

## 2023-02-07 LAB — TSH: TSH: 2.28 u[IU]/mL (ref 0.450–4.500)

## 2023-02-07 LAB — HEMOGLOBIN A1C
Est. average glucose Bld gHb Est-mCnc: 120 mg/dL
Hgb A1c MFr Bld: 5.8 % — ABNORMAL HIGH (ref 4.8–5.6)

## 2023-02-07 LAB — INSULIN, RANDOM: INSULIN: 18 u[IU]/mL (ref 2.6–24.9)

## 2023-02-07 NOTE — Assessment & Plan Note (Signed)
Blood pressure at goal for age and risk category.  On chlorthalidone without adverse effects.    Continue with weight loss therapy. Losing 10% may improve blood pressure control. Monitor for symptoms of orthostasis while losing weight. Continue current regimen and home monitoring for a goal blood pressure of 120/80.   Check renal parameters today.

## 2023-02-07 NOTE — Assessment & Plan Note (Signed)
Most recent A1c is  Lab Results  Component Value Date   HGBA1C 5.8 (H) 02/06/2023   HGBA1C 5.8 (H) 10/07/2018    Patient aware of disease state and risk of progression. This may contribute to abnormal cravings, fatigue and diabetic complications without having diabetes.   We have discussed treatment options which include: losing 7 to 10% of body weight, increasing physical activity to a goal of 150 minutes a week at moderate intensity.  Advised to maintain a diet low on simple and processed carbohydrates.  She had been on Wegovy in the past.  She may benefit from starting metformin for pharmacoprophylaxis and incretin effect.  Topiramate may also be of benefit.  I will defer further discussions on pharmacotherapy to Dr. Lawson Radar

## 2023-02-07 NOTE — Assessment & Plan Note (Signed)
She has increased orexigenic signaling, impaired satiety and inhibitory control. This is secondary to an abnormal energy regulation system and pathological neurohormonal pathways characteristic of excess adiposity.  In addition to nutritional and behavioral strategies she benefits from pharmacotherapy.  She had been on Wegovy in the past with good results but medication has been discontinued.  She will follow-up with Dr. Lawson Radar to discuss other treatment options

## 2023-02-07 NOTE — Assessment & Plan Note (Signed)
She is currently on high-dose vitamin D supplementation.  Will check vitamin D levels today to make sure levels are within range.

## 2023-02-07 NOTE — Assessment & Plan Note (Signed)
  Since August 2024, she has experienced rapid weight gain, increasing from 210 to 245 pounds. Previously, she successfully lost weight using Wegovy, but discontinued it due to lack of insurance coverage. Her physical activity is limited following rotator cuff surgery and ongoing rehabilitation. She also has prediabetes and exhibits stress eating with a preference for sweets.  She was provided with a 7-day AI generated pescatarian plan that also incorporates eggs and dairy targeting 1300 calories per day and consider meal replacements for convenience and calorie control. Encouragement for resuming physical activity as soon as medically cleared is given.  She will will discuss the potential use of Metformin for prediabetes and as a prerequisite for GLP-1 agonist coverage. Labs to be ordered include A1c, fasting insulin, fasting blood sugar, cholesterol, kidney function, electrolytes, and vitamin D. A follow-up in 3 weeks is scheduled to discuss medication options.

## 2023-02-07 NOTE — Assessment & Plan Note (Addendum)
Her indirect calorimetry is higher than calculated which goes against patient's progressive weight gain.  Have based her calorie target based on calculated as I feel that her REE may be overestimated.  If patient has physiological hunger and is losing weight rapidly then her calorie target could be increased

## 2023-02-24 ENCOUNTER — Ambulatory Visit (INDEPENDENT_AMBULATORY_CARE_PROVIDER_SITE_OTHER): Payer: BC Managed Care – PPO | Admitting: Family Medicine

## 2023-02-24 ENCOUNTER — Encounter (INDEPENDENT_AMBULATORY_CARE_PROVIDER_SITE_OTHER): Payer: Self-pay | Admitting: Family Medicine

## 2023-02-24 VITALS — BP 158/81 | HR 83 | Temp 98.1°F | Ht 66.0 in | Wt 244.0 lb

## 2023-02-24 DIAGNOSIS — Z6839 Body mass index (BMI) 39.0-39.9, adult: Secondary | ICD-10-CM

## 2023-02-24 DIAGNOSIS — E669 Obesity, unspecified: Secondary | ICD-10-CM | POA: Diagnosis not present

## 2023-02-24 DIAGNOSIS — R7303 Prediabetes: Secondary | ICD-10-CM | POA: Diagnosis not present

## 2023-02-24 DIAGNOSIS — E7849 Other hyperlipidemia: Secondary | ICD-10-CM

## 2023-02-24 DIAGNOSIS — E66813 Obesity, class 3: Secondary | ICD-10-CM

## 2023-02-24 NOTE — Progress Notes (Signed)
Chief Complaint:   OBESITY Mackenzie Watson is here to discuss her progress with her obesity treatment plan along with follow-up of her obesity related diagnoses. Mackenzie Watson is on the [employer] and states she is following her eating plan approximately 50% of the time. Mackenzie Watson states she is doing physical therapy for 20-60 minutes 2-7 times per week.  Today's visit was #: 59 Starting weight: 249 lbs Starting date: 10/07/2018 Today's weight: 244 lbs Today's date: 02/24/2023 Total lbs lost to date: 5 Total lbs lost since last in-office visit: 1  Interim History: Patient is still recovering from rotator cuff surgery and is still going thru PT.  She is still dealing with some pain and trying to manage it with the change in weather. It helps that when she is at home she can take naps throughout the day.  She is easing back into work early November. She is doing well with PT and increasing her range of motion with her therapy.  She is nervous about starting to do more activity when PT is over. She has been eating off plan more than she was previously.  She has done emotional eating and has had increased sugar intake.  She knows she has gained weight. She used to drink smoothies and thinks that will help reign her back in to get more on plan. She has implemented the [employer] protein shakes as a meal substitute.    Subjective:   1. Prediabetes Patient's last A1c was 5.8 and insulin 18.0.  She was on incretin therapy previously, but it is no longer covered by insurance.  I discussed labs with the patient today.  2. Other hyperlipidemia Patient's last LDL increased from prior, but so did her HDL from 48 to 56.  She is not on medications.  Assessment/Plan:   1. Prediabetes Patient is to transition to her journaling plan.  2. Other hyperlipidemia Patient is to transition to her journaling plan.  3. BMI 39.0-39.9,adult  4. Obesity with starting BMI of 40.2 Patient to start food log or journaling  meal plan.  The initial goal will be to habitually log or journal for at least 4 days a week.  The expectation it that patient may not initially meet calorie or protein goals as the nturitional understanding of food intake is begun.  We discussed the 10:1 ratio when reading a food label.  Patient agrees to keep a food log either electronically or on paper and bring to the next appointment to be able to dissect and discuss it with provider.   Mackenzie Watson is currently in the action stage of change. As such, her goal is to continue with weight loss efforts. She has agreed to the [employer].   Exercise goals: All adults should avoid inactivity. Some physical activity is better than none, and adults who participate in any amount of physical activity gain some health benefits.  Behavioral modification strategies: increasing lean protein intake, meal planning and cooking strategies, keeping healthy foods in the home, and planning for success.  Mackenzie Watson has agreed to follow-up with our clinic in 4 weeks. She was informed of the importance of frequent follow-up visits to maximize her success with intensive lifestyle modifications for her multiple health conditions.   Objective:   Blood pressure (!) 158/81, pulse 83, temperature 98.1 F (36.7 C), height 5\' 6"  (1.676 m), weight 244 lb (110.7 kg), SpO2 98%. Body mass index is 39.38 kg/m.  General: Cooperative, alert, well developed, in no acute distress. HEENT: Conjunctivae and  lids unremarkable. Cardiovascular: Regular rhythm.  Lungs: Normal work of breathing. Neurologic: No focal deficits.   Lab Results  Component Value Date   CREATININE 0.90 02/06/2023   BUN 8 02/06/2023   NA 141 02/06/2023   K 4.0 02/06/2023   CL 103 02/06/2023   CO2 24 02/06/2023   Lab Results  Component Value Date   ALT 19 02/06/2023   AST 17 02/06/2023   ALKPHOS 74 02/06/2023   BILITOT 0.4 02/06/2023   Lab Results  Component Value Date   HGBA1C 5.8 (H) 02/06/2023    HGBA1C 5.8 (H) 08/06/2021   HGBA1C 5.8 (H) 03/13/2021   HGBA1C 5.9 10/16/2020   HGBA1C 5.7 (H) 05/15/2020   Lab Results  Component Value Date   INSULIN 18.0 02/06/2023   INSULIN 16.3 08/06/2021   INSULIN 12.4 05/15/2020   INSULIN 14.9 12/14/2019   INSULIN 11.2 07/13/2019   Lab Results  Component Value Date   TSH 2.280 02/06/2023   Lab Results  Component Value Date   CHOL 195 02/06/2023   HDL 56 02/06/2023   LDLCALC 118 (H) 02/06/2023   TRIG 117 02/06/2023   Lab Results  Component Value Date   VD25OH 62.9 02/06/2023   VD25OH 61.7 08/06/2021   VD25OH 50.1 03/13/2021   Lab Results  Component Value Date   WBC 5.9 10/07/2018   HGB 13.5 10/07/2018   HCT 42.9 10/07/2018   MCV 81 10/07/2018   No results found for: "IRON", "TIBC", "FERRITIN"  Attestation Statements:   Reviewed by clinician on day of visit: allergies, medications, problem list, medical history, surgical history, family history, social history, and previous encounter notes.   I, Burt Knack, am acting as transcriptionist for Reuben Likes, MD.  I have reviewed the above documentation for accuracy and completeness, and I agree with the above. - Reuben Likes, MD

## 2023-03-31 ENCOUNTER — Ambulatory Visit (INDEPENDENT_AMBULATORY_CARE_PROVIDER_SITE_OTHER): Payer: BC Managed Care – PPO | Admitting: Family Medicine

## 2023-04-02 ENCOUNTER — Ambulatory Visit (INDEPENDENT_AMBULATORY_CARE_PROVIDER_SITE_OTHER): Payer: BC Managed Care – PPO | Admitting: Family Medicine

## 2023-04-02 ENCOUNTER — Encounter (INDEPENDENT_AMBULATORY_CARE_PROVIDER_SITE_OTHER): Payer: Self-pay | Admitting: Family Medicine

## 2023-04-02 ENCOUNTER — Other Ambulatory Visit (HOSPITAL_COMMUNITY): Payer: Self-pay

## 2023-04-02 VITALS — BP 114/74 | HR 72 | Temp 98.1°F | Ht 66.0 in | Wt 244.0 lb

## 2023-04-02 DIAGNOSIS — E669 Obesity, unspecified: Secondary | ICD-10-CM | POA: Diagnosis not present

## 2023-04-02 DIAGNOSIS — F32A Depression, unspecified: Secondary | ICD-10-CM

## 2023-04-02 DIAGNOSIS — I10 Essential (primary) hypertension: Secondary | ICD-10-CM

## 2023-04-02 DIAGNOSIS — F419 Anxiety disorder, unspecified: Secondary | ICD-10-CM | POA: Diagnosis not present

## 2023-04-02 DIAGNOSIS — E66813 Obesity, class 3: Secondary | ICD-10-CM

## 2023-04-02 DIAGNOSIS — R632 Polyphagia: Secondary | ICD-10-CM | POA: Insufficient documentation

## 2023-04-02 DIAGNOSIS — Z6839 Body mass index (BMI) 39.0-39.9, adult: Secondary | ICD-10-CM

## 2023-04-02 DIAGNOSIS — Z6841 Body Mass Index (BMI) 40.0 and over, adult: Secondary | ICD-10-CM

## 2023-04-02 MED ORDER — ESCITALOPRAM OXALATE 10 MG PO TABS
10.0000 mg | ORAL_TABLET | Freq: Every day | ORAL | 1 refills | Status: DC
  Filled 2023-04-02 – 2023-05-01 (×2): qty 30, 30d supply, fill #0
  Filled 2023-06-05 (×2): qty 30, 30d supply, fill #1

## 2023-04-02 NOTE — Assessment & Plan Note (Signed)
Blood pressure initially slightly elevated today.  Repeat BP within normal limits.  She is on Chlorthalidone 12.5mg  but didn't take her medication today due to rushing to get to appointment.  Will follow up on BP at next appointment to ensure it is better controlled.  No changes to medication at this time.

## 2023-04-02 NOTE — Progress Notes (Signed)
SUBJECTIVE:  Chief Complaint: Obesity  Interim History: Mackenzie Watson has gone back to work since last appointment.  Mackenzie Watson is still in physical therapy and Mackenzie Watson was given a clearance to start weight bearing activity cautiously.  Mackenzie Watson wants to start more consistent activity in the next month. Mackenzie Watson is still doing the pescatarian and is adding more chicken in.  Mackenzie Watson has done more food journaling recently and is writing everything down.  Mackenzie Watson is a bit a short on her protein intake (goal of 95 grams) but is around 75-80 grams daily.  Mackenzie Watson is drinking [employer] protein shakes to help get more protein in daily. For Thanksgiving Mackenzie Watson will be gathering with friends and family locally.  Mackenzie Watson may go out of town in the next few days.  Mackenzie Watson does have a birthday party coming up for one of her friends coming up.  Mackenzie Watson has a Haematologist Christmas dinner for the holidays.   Mackenzie Watson is here to discuss her progress with her obesity treatment plan. Mackenzie Watson is on the [employer] and states Mackenzie Watson is following her eating plan approximately 75 % of the time. Mackenzie Watson states Mackenzie Watson is not exercising.   OBJECTIVE: Visit Diagnoses: Problem List Items Addressed This Visit       Cardiovascular and Mediastinum   Essential hypertension    Blood pressure initially slightly elevated today.  Repeat BP within normal limits.  Mackenzie Watson is on Chlorthalidone 12.5mg  but didn't take her medication today due to rushing to get to appointment.  Will follow up on BP at next appointment to ensure it is better controlled.  No changes to medication at this time.        Other   Anxiety and depression - Primary    Patient on Lexapro and doing well.  Her symptoms were well controlled and Mackenzie Watson denies suicide and homicidal ideation.  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  Mackenzie Watson needs a refill of lexapro today so the refill at the same dose was sent in.      Relevant Medications   escitalopram (LEXAPRO) 10 MG tablet     Vitals Temp: 98.1 F (36.7 C) BP: 114/74 Pulse Rate: 72 SpO2: 97 %   Anthropometric Measurements Height: 5\' 6"  (1.676 m) Weight: 244 lb (110.7 kg) BMI (Calculated): 39.4 Weight at Last Visit: 244 lb Weight Lost Since Last Visit: 0 Weight Gained Since Last Visit: 0. Starting Weight: 249 lb Total Weight Loss (lbs): 1 lb (0.454 kg)   Body Composition  Body Fat %: 46.1 % Fat Mass (lbs): 112.6 lbs Muscle Mass (lbs): 125.2 lbs Total Body Water (lbs): 89.2 lbs Visceral Fat Rating : 13   Other Clinical Data RMR: [id number] Fasting: no Labs: no Today's Visit #: 22 Starting Date: 11/03/18     ASSESSMENT AND PLAN:  Diet: Mackenzie Watson is currently in the action stage of change. As such, her goal is to continue with weight loss efforts. Mackenzie Watson has agreed to [employer].  Exercise: Mackenzie Watson has been instructed to work up to a goal of 150 minutes of combined cardio and strengthening exercise per week for weight loss and overall health benefits.   Behavior Modification:  We discussed the following Behavioral Modification Strategies today: increasing lean protein intake, increasing vegetables, meal planning and cooking strategies, better snacking choices, holiday eating strategies, and celebration eating strategies.   No follow-ups on file.Marland Kitchen Mackenzie Watson was informed of the importance of frequent follow up visits to maximize her success  with intensive lifestyle modifications for her multiple health conditions.  Attestation Statements:   Reviewed by clinician on day of visit: allergies, medications, problem list, medical history, surgical history, family history, social history, and previous encounter notes.    Reuben Likes, MD

## 2023-04-02 NOTE — Assessment & Plan Note (Signed)
Patient on Lexapro and doing well.  Her symptoms were well controlled and she denies suicide and homicidal ideation.  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  She needs a refill of lexapro today so the refill at the same dose was sent in.

## 2023-04-14 ENCOUNTER — Other Ambulatory Visit (HOSPITAL_COMMUNITY): Payer: Self-pay

## 2023-05-01 ENCOUNTER — Other Ambulatory Visit: Payer: Self-pay

## 2023-05-01 ENCOUNTER — Other Ambulatory Visit (HOSPITAL_COMMUNITY): Payer: Self-pay

## 2023-05-08 ENCOUNTER — Other Ambulatory Visit (HOSPITAL_COMMUNITY): Payer: Self-pay

## 2023-05-19 ENCOUNTER — Ambulatory Visit (INDEPENDENT_AMBULATORY_CARE_PROVIDER_SITE_OTHER): Payer: Self-pay | Admitting: Family Medicine

## 2023-05-19 ENCOUNTER — Encounter (INDEPENDENT_AMBULATORY_CARE_PROVIDER_SITE_OTHER): Payer: Self-pay | Admitting: Family Medicine

## 2023-05-19 VITALS — BP 156/98 | HR 71 | Temp 98.4°F | Ht 66.0 in | Wt 251.0 lb

## 2023-05-19 DIAGNOSIS — R7303 Prediabetes: Secondary | ICD-10-CM

## 2023-05-19 DIAGNOSIS — Z6841 Body Mass Index (BMI) 40.0 and over, adult: Secondary | ICD-10-CM

## 2023-05-19 DIAGNOSIS — E669 Obesity, unspecified: Secondary | ICD-10-CM

## 2023-05-19 DIAGNOSIS — G4733 Obstructive sleep apnea (adult) (pediatric): Secondary | ICD-10-CM | POA: Insufficient documentation

## 2023-05-19 NOTE — Assessment & Plan Note (Signed)
 Appointment with Dr. Lewie Chamber in the next few weeks for yearly physical.  She was previously on semaglutide but insurance stopped covering it.  Repeat labs at next appointment with PCP.

## 2023-05-19 NOTE — Assessment & Plan Note (Signed)
 Patient reached out to sleep doctor to get back on CPAP.  She is waiting to hear about a repeat sleep test.  Will follow up on plan for sleep study at next appointment.

## 2023-05-19 NOTE — Progress Notes (Signed)
   SUBJECTIVE:  Chief Complaint: Obesity  Interim History: Patient had a few people in her family  died over the holidays.  Her godfather, and her boyfriends brother.  She went back to her sleep doctor to get back on her CPAP for her OSA.  She is awaiting to hear about when she will need to get a repeat sleep test.  She also has been instructed to increase her BP meds.  She thinks the stress of going back to work and the recent deaths has really impacted her.    Mackenzie Watson is here to discuss her progress with her obesity treatment plan. She is on the [employer] and states she is following her eating plan approximately 75 % of the time. She states she is walking 3 days a week.   OBJECTIVE: Visit Diagnoses: Problem List Items Addressed This Visit       Respiratory   OSA (obstructive sleep apnea) - Primary   Patient reached out to sleep doctor to get back on CPAP.  She is waiting to hear about a repeat sleep test.  Will follow up on plan for sleep study at next appointment.        Other   Prediabetes   Appointment with Dr. Leonel in the next few weeks for yearly physical.  She was previously on semaglutide  but insurance stopped covering it.  Repeat labs at next appointment with PCP.       Vitals Temp: 98.4 F (36.9 C) BP: (!) 156/98 Pulse Rate: 71 SpO2: 98 %   Anthropometric Measurements Height: 5' 6 (1.676 m) Weight: 251 lb (113.9 kg) BMI (Calculated): 40.53 Weight at Last Visit: 244 lb Weight Lost Since Last Visit: 0 Weight Gained Since Last Visit: 7 Starting Weight: 249 lb Total Weight Loss (lbs): 0 lb (0 kg)   Body Composition  Body Fat %: 46.3 % Fat Mass (lbs): 116.2 lbs Muscle Mass (lbs): 128 lbs Total Body Water (lbs): 91 lbs Visceral Fat Rating : 14   Other Clinical Data Today's Visit #: 23 Starting Date: 11/03/18     ASSESSMENT AND PLAN:  Diet: Mackenzie Watson is currently in the action stage of change. As such, her goal is to continue with weight  loss efforts. She has agreed to [employer].  Exercise: Mackenzie Watson has been instructed that some exercise is better than none for weight loss and overall health benefits.   Behavior Modification:  We discussed the following Behavioral Modification Strategies today: increasing lean protein intake, increasing vegetables, no skipping meals, meal planning and cooking strategies, better snacking choices, and planning for success.   No follow-ups on file.Mackenzie Watson She was informed of the importance of frequent follow up visits to maximize her success with intensive lifestyle modifications for her multiple health conditions.  Attestation Statements:   Reviewed by clinician on day of visit: allergies, medications, problem list, medical history, surgical history, family history, social history, and previous encounter notes.      Mackenzie RAYMOND Cho, MD

## 2023-05-20 ENCOUNTER — Ambulatory Visit (INDEPENDENT_AMBULATORY_CARE_PROVIDER_SITE_OTHER): Payer: Self-pay | Admitting: Family Medicine

## 2023-05-22 ENCOUNTER — Other Ambulatory Visit (HOSPITAL_COMMUNITY): Payer: Self-pay

## 2023-05-23 ENCOUNTER — Other Ambulatory Visit (HOSPITAL_COMMUNITY): Payer: Self-pay

## 2023-05-23 MED ORDER — CHLORTHALIDONE 25 MG PO TABS
12.5000 mg | ORAL_TABLET | ORAL | 1 refills | Status: DC
  Filled 2023-06-05: qty 50, 90d supply, fill #0

## 2023-06-05 ENCOUNTER — Other Ambulatory Visit: Payer: Self-pay

## 2023-06-05 ENCOUNTER — Other Ambulatory Visit: Payer: Self-pay | Admitting: Family Medicine

## 2023-06-05 ENCOUNTER — Other Ambulatory Visit (HOSPITAL_COMMUNITY): Payer: Self-pay

## 2023-06-05 MED ORDER — CHLORTHALIDONE 25 MG PO TABS
25.0000 mg | ORAL_TABLET | Freq: Every morning | ORAL | 0 refills | Status: DC
  Filled 2023-06-05 (×2): qty 90, 90d supply, fill #0

## 2023-06-19 ENCOUNTER — Ambulatory Visit (INDEPENDENT_AMBULATORY_CARE_PROVIDER_SITE_OTHER): Payer: 59 | Admitting: Family Medicine

## 2023-06-19 ENCOUNTER — Other Ambulatory Visit (HOSPITAL_COMMUNITY): Payer: Self-pay

## 2023-06-19 ENCOUNTER — Telehealth (INDEPENDENT_AMBULATORY_CARE_PROVIDER_SITE_OTHER): Payer: Self-pay | Admitting: *Deleted

## 2023-06-19 ENCOUNTER — Encounter (INDEPENDENT_AMBULATORY_CARE_PROVIDER_SITE_OTHER): Payer: Self-pay | Admitting: Family Medicine

## 2023-06-19 VITALS — BP 138/88 | HR 90 | Temp 98.1°F | Ht 66.0 in | Wt 249.0 lb

## 2023-06-19 DIAGNOSIS — Z6841 Body Mass Index (BMI) 40.0 and over, adult: Secondary | ICD-10-CM | POA: Diagnosis not present

## 2023-06-19 DIAGNOSIS — I10 Essential (primary) hypertension: Secondary | ICD-10-CM

## 2023-06-19 DIAGNOSIS — E66813 Obesity, class 3: Secondary | ICD-10-CM

## 2023-06-19 DIAGNOSIS — G4733 Obstructive sleep apnea (adult) (pediatric): Secondary | ICD-10-CM | POA: Diagnosis not present

## 2023-06-19 MED ORDER — ZEPBOUND 2.5 MG/0.5ML ~~LOC~~ SOAJ
2.5000 mg | SUBCUTANEOUS | 0 refills | Status: DC
Start: 2023-06-19 — End: 2023-09-02
  Filled 2023-06-19 (×2): qty 2, 28d supply, fill #0

## 2023-06-19 NOTE — Assessment & Plan Note (Signed)
Starting weight: 249 on 11/03/18 Peak weight: 251 BMR: 2232 Previous obesity management: patient previously on wegovy and got down to 212lb but then insurance stopped covering.  She was on combination phentermine and topiramate and got to max dose without achieving 5% loss Body Fat %: 47% Starting Meal Plan: Category 2 Meal Plan needs: needs to eat 2 full meals and have part of a meal in the middle of the day when at school/ work  Patient has unfortunately had a few medical setbacks in her weight loss like her recent shoulder surgery.  She is committed to trying to get back on a routine and consistency to get her nutrition and intake in.

## 2023-06-19 NOTE — Progress Notes (Signed)
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  She finished her physical therapy and she is waiting to hear about when that will be.  She is experiencing right knee pain and she is doing her best to try to remedy this with bio freeze, elevation and heating pad.  Mackenzie Watson is here to discuss her progress with her obesity treatment plan. She is on the Pescatarian Plan plus extra protein and states she is following her eating plan approximately 50 % of the time. She states she is exercising at physical therapy    OBJECTIVE: Visit Diagnoses: Problem List Items Addressed This Visit       Cardiovascular and Mediastinum   Essential hypertension   Patient is doing well on chlorthalidone.  She is taking 25mg  daily now and her BP is well controlled.  No chest pain, chest pressure or headache.  She does not need a refill today.  Continue with current dose.  Follow up BP at next appointment.        Respiratory   OSA (obstructive sleep apnea) - Primary   Sleep study done on 05/28/23 showing severe sleep apnea with AHI of 43.3/hr.  Minimum O2 saturation of 70%.  She is using her CPAP now.  She is interested in Zepbound now that is approved by FDA for treatment of sleep apnea.  We discussed benefits, risks, common side effects today.  She is interested in pursing pharmacotherapy so prescription sent in today.      Relevant Medications   tirzepatide (ZEPBOUND) 2.5 MG/0.5ML Pen     Other   Class 3 severe obesity with serious comorbidity and body mass index (BMI) of 40.0 to 44.9 in adult [facility])   Starting weight: 249 on 11/03/18 Peak weight: 251 BMR: 2232 Previous obesity management: patient previously on wegovy and got down to 212lb but then insurance stopped covering.  She was on combination phentermine and topiramate and got to max dose without achieving 5% loss Body Fat %:  47% Starting Meal Plan: Category 2 Meal Plan needs: needs to eat 2 full meals and have part of a meal in the middle of the day when at school/ work  Patient has unfortunately had a few medical setbacks in her weight loss like her recent shoulder surgery.  She is committed to trying to get back on a routine and consistency to get her nutrition and intake in.       Relevant Medications   tirzepatide (ZEPBOUND) 2.5 MG/0.5ML Pen    Vitals Temp: 98.1 F (36.7 C) BP: 138/88 Pulse Rate: 90 SpO2: 98 %   Anthropometric Measurements Height: 5\' 6"  (1.676 m) Weight: 249 lb (112.9 kg) BMI (Calculated): 40.21 Weight at Last Visit: 251lb Weight Lost Since Last Visit: 2lb Weight Gained Since Last Visit: 0lb Starting Weight: 249lb Total Weight Loss (lbs): 0 lb (0 kg)   Body Composition  Body Fat %: 47 % Fat Mass (lbs): 117.2 lbs Muscle Mass (lbs): 125.4 lbs Total Body Water (lbs): 91.6 lbs Visceral Fat Rating : 14   Other Clinical Data Today's Visit #: 24 Starting Date: 11/03/18     ASSESSMENT AND PLAN:  Diet: Mackenzie Watson is currently in the action stage of change. As such, her goal is to continue with weight loss efforts. She has agreed to [employer].  Exercise: Mackenzie Watson has been instructed that some exercise is better than none for weight  loss and overall health benefits.   Behavior Modification:  We discussed the following Behavioral Modification Strategies today: increasing lean protein intake, increasing vegetables, no skipping meals, and meal planning and cooking strategies.   Return in about 4 weeks (around 07/17/2023).Marland Kitchen She was informed of the importance of frequent follow up visits to maximize her success with intensive lifestyle modifications for her multiple health conditions.  Attestation Statements:   Reviewed by clinician on day of visit: allergies, medications, problem list, medical history, surgical history, family history, social history, and previous  encounter notes.      Reuben Likes MD

## 2023-06-19 NOTE — Assessment & Plan Note (Signed)
Patient is doing well on chlorthalidone.  She is taking 25mg  daily now and her BP is well controlled.  No chest pain, chest pressure or headache.  She does not need a refill today.  Continue with current dose.  Follow up BP at next appointment.

## 2023-06-19 NOTE — Telephone Encounter (Signed)
PA SUBMITTED VIA [employer].(Key: [id number])   Information regarding your request Your PA has been resolved, no additional PA is required. For further inquiries please contact the number on the back of the member prescription card. (Message [phone])

## 2023-06-19 NOTE — Assessment & Plan Note (Signed)
Sleep study done on 05/28/23 showing severe sleep apnea with AHI of 43.3/hr.  Minimum O2 saturation of 70%.  She is using her CPAP now.  She is interested in Zepbound now that is approved by FDA for treatment of sleep apnea.  We discussed benefits, risks, common side effects today.  She is interested in pursing pharmacotherapy so prescription sent in today.

## 2023-06-24 ENCOUNTER — Ambulatory Visit
Admission: RE | Admit: 2023-06-24 | Discharge: 2023-06-24 | Disposition: A | Discharge status: Home / Self Care | Payer: 59 | Source: Ambulatory Visit | Attending: Family Medicine | Admitting: Family Medicine

## 2023-06-24 ENCOUNTER — Other Ambulatory Visit: Payer: Self-pay | Admitting: Family Medicine

## 2023-06-24 DIAGNOSIS — M25561 Pain in right knee: Secondary | ICD-10-CM

## 2023-06-25 ENCOUNTER — Encounter (INDEPENDENT_AMBULATORY_CARE_PROVIDER_SITE_OTHER): Payer: Self-pay | Admitting: Family Medicine

## 2023-06-25 ENCOUNTER — Other Ambulatory Visit (HOSPITAL_COMMUNITY): Payer: Self-pay

## 2023-07-01 ENCOUNTER — Other Ambulatory Visit (HOSPITAL_COMMUNITY): Payer: Self-pay

## 2023-07-24 ENCOUNTER — Other Ambulatory Visit (HOSPITAL_COMMUNITY): Payer: Self-pay

## 2023-07-24 ENCOUNTER — Ambulatory Visit (INDEPENDENT_AMBULATORY_CARE_PROVIDER_SITE_OTHER): Payer: 59 | Admitting: Family Medicine

## 2023-07-24 ENCOUNTER — Encounter (INDEPENDENT_AMBULATORY_CARE_PROVIDER_SITE_OTHER): Payer: Self-pay | Admitting: Family Medicine

## 2023-07-24 VITALS — BP 129/80 | HR 78 | Temp 98.0°F | Ht 66.0 in | Wt 250.0 lb

## 2023-07-24 DIAGNOSIS — I10 Essential (primary) hypertension: Secondary | ICD-10-CM

## 2023-07-24 DIAGNOSIS — F32A Depression, unspecified: Secondary | ICD-10-CM

## 2023-07-24 DIAGNOSIS — Z6841 Body Mass Index (BMI) 40.0 and over, adult: Secondary | ICD-10-CM

## 2023-07-24 DIAGNOSIS — E66813 Obesity, class 3: Secondary | ICD-10-CM | POA: Diagnosis not present

## 2023-07-24 DIAGNOSIS — F419 Anxiety disorder, unspecified: Secondary | ICD-10-CM

## 2023-07-24 MED ORDER — ESCITALOPRAM OXALATE 10 MG PO TABS
15.0000 mg | ORAL_TABLET | Freq: Every day | ORAL | 0 refills | Status: DC
  Filled 2023-07-24: qty 135, 90d supply, fill #0

## 2023-07-24 NOTE — Progress Notes (Signed)
 SUBJECTIVE:  Chief Complaint: Obesity  Interim History: Patient voices that she couldn't get Zepbound due to cost even with insurance coverage.  She is still doing the Pescatarian plan and is adding chicken in during the week if she needs to incorporate more protein into her meals.  She is fully released from her orthopedic PT for her shoulder.  She has started more activity but is being cautious with this.  She has spring break coming up and she doesn't have any plans but is anticipating being out of town.   Mackenzie Watson is here to discuss her progress with her obesity treatment plan. She is on the [employer] and states she is following her eating plan approximately 75 % of the time. She states she is walking 30 minutes 2 times per week.   OBJECTIVE: Visit Diagnoses: Problem List Items Addressed This Visit       Cardiovascular and Mediastinum   Essential hypertension - Primary   Blood pressure well controlled today.  No chest pain, chest pressure, headache. On chlorthalidone 25mg  daily.  Does not need a refill today.        Other   Class 3 severe obesity with serious comorbidity and body mass index (BMI) of 40.0 to 44.9 in adult [facility])   See anthropometric measurements.  She previously was on GLP1 medication as well as combination phentermine and topiramate and unfortunately due to lack of insurance coverage could not continue with GLP1 medication.  Did not lose 5% on makeshift qsymia.       Anxiety and depression   Ran out of lexapro prior to this appointment.  She is noticing an increase in her stress recently and may need a higher dose.  Will increase to a pill and a half daily. 90 day prescription sent in.      Relevant Medications   escitalopram (LEXAPRO) 10 MG tablet    Vitals Temp: 98 F (36.7 C) BP: 129/80 Pulse Rate: 78 SpO2: 99 %   Anthropometric Measurements Height: 5\' 6"  (1.676 m) Weight: 250 lb (113.4 kg) BMI (Calculated): 40.37 Weight at Last Visit:  249 lb Weight Lost Since Last Visit: 0 Weight Gained Since Last Visit: 1 Starting Weight: 249 lb Total Weight Loss (lbs): 0 lb (0 kg)   Body Composition  Body Fat %: 47.8 % Fat Mass (lbs): 119.6 lbs Muscle Mass (lbs): 123.8 lbs Total Body Water (lbs): 93.8 lbs Visceral Fat Rating : 14   Other Clinical Data Today's Visit #: 25 Starting Date: 11/03/18 Comments: Pesc     ASSESSMENT AND PLAN:  Diet: Mackenzie Watson is currently in the action stage of change. As such, her goal is to continue with weight loss efforts and has agreed to the [employer].   Exercise:  All adults should avoid inactivity. Some activity is better than none, and adults who participate in any amount of physical activity, gain some health benefits.  Patient to consider starting a garden to help with the cost of vegetables.   Behavior Modification:  We discussed the following Behavioral Modification Strategies today: increasing lean protein intake, decreasing simple carbohydrates, meal planning and cooking strategies, keeping healthy foods in the home, and planning for success.  She wants to increase her total water intake daily.   Return in about 4 weeks (around 08/21/2023).Marland Kitchen She was informed of the importance of frequent follow up visits to maximize her success with intensive lifestyle modifications for her multiple health conditions.  Attestation Statements:   Reviewed by clinician on day  of visit: allergies, medications, problem list, medical history, surgical history, family history, social history, and previous encounter notes.  Reuben Likes, MD

## 2023-07-24 NOTE — Assessment & Plan Note (Signed)
 See anthropometric measurements.  She previously was on GLP1 medication as well as combination phentermine and topiramate and unfortunately due to lack of insurance coverage could not continue with GLP1 medication.  Did not lose 5% on makeshift qsymia.

## 2023-07-24 NOTE — Assessment & Plan Note (Signed)
 Blood pressure well controlled today.  No chest pain, chest pressure, headache. On chlorthalidone 25mg  daily.  Does not need a refill today.

## 2023-07-24 NOTE — Assessment & Plan Note (Signed)
 Ran out of lexapro prior to this appointment.  She is noticing an increase in her stress recently and may need a higher dose.  Will increase to a pill and a half daily. 90 day prescription sent in.

## 2023-07-31 ENCOUNTER — Other Ambulatory Visit (HOSPITAL_COMMUNITY): Payer: Self-pay

## 2023-09-02 ENCOUNTER — Ambulatory Visit (INDEPENDENT_AMBULATORY_CARE_PROVIDER_SITE_OTHER): Admitting: Family Medicine

## 2023-09-02 ENCOUNTER — Other Ambulatory Visit (HOSPITAL_COMMUNITY): Payer: Self-pay

## 2023-09-02 VITALS — BP 122/80 | HR 77 | Temp 98.2°F | Ht 66.0 in | Wt 247.0 lb

## 2023-09-02 DIAGNOSIS — Z6839 Body mass index (BMI) 39.0-39.9, adult: Secondary | ICD-10-CM

## 2023-09-02 DIAGNOSIS — I10 Essential (primary) hypertension: Secondary | ICD-10-CM

## 2023-09-02 DIAGNOSIS — G4733 Obstructive sleep apnea (adult) (pediatric): Secondary | ICD-10-CM | POA: Diagnosis not present

## 2023-09-02 MED ORDER — ZEPBOUND 2.5 MG/0.5ML ~~LOC~~ SOAJ
2.5000 mg | SUBCUTANEOUS | 0 refills | Status: DC
  Filled 2023-09-02: qty 2, 28d supply, fill #0

## 2023-09-02 NOTE — Progress Notes (Signed)
 SUBJECTIVE:  Chief Complaint: Obesity  Interim History: Patient has had a difficult few weeks- her godsister died due to complications after an accident.  Services were just a few days ago. She recognizes she is in the cycle of grief and is using this situation to remind her to do what she can to get as healthy as possible.  Has done some emotional not eating- not eating 3 meals a day.  Some days she hasn't eaten as much as she knows she needs to.  She has been more conscious about getting her protein in.  Getting her protein in from shrimp or chicken.  Forest is here to discuss her progress with her obesity treatment plan. She is on the [employer] and states she is following her eating plan approximately 75 % of the time. She states she is walking 30 minutes 3 times per week.   OBJECTIVE: Visit Diagnoses: Problem List Items Addressed This Visit       Cardiovascular and Mediastinum   Essential hypertension   Blood pressure controlled on chlorthalidone .  No chest pain, chest pressure or headache.  No refill needed of medication today and no change in dosage.        Respiratory   OSA (obstructive sleep apnea) - Primary   Sleep study done on 05/28/23 showing severe sleep apnea with AHI of 43.3/hr.  Minimum O2 saturation of 70%.  She is using her CPAP now and feeling some improvement with her CPAP nightly.  She is interested in Zepbound  now that is approved by FDA for treatment of sleep apnea.  We discussed benefits, risks, common side effects today.  Due to elevated BP and treatment for hypertension alternative medication for treatment of OSA would be excluded and not appropriate.      Relevant Medications   tirzepatide  (ZEPBOUND ) 2.5 MG/0.5ML Pen   Other Visit Diagnoses       Morbid obesity (HCC)       Relevant Medications   tirzepatide  (ZEPBOUND ) 2.5 MG/0.5ML Pen     BMI 39.0-39.9,adult           Vitals Temp: 98.2 F (36.8 C) BP: 122/80 Pulse Rate: 77 SpO2: 100  %   Anthropometric Measurements Height: 5\' 6"  (1.676 m) Weight: 247 lb (112 kg) BMI (Calculated): 39.89 Weight at Last Visit: 250 lb Weight Lost Since Last Visit: 3 Weight Gained Since Last Visit: 0 Starting Weight: 249 lb Total Weight Loss (lbs): 2 lb (0.907 kg)   Body Composition  Body Fat %: 47.5 % Fat Mass (lbs): 117.8 lbs Muscle Mass (lbs): 123.4 lbs Total Body Water (lbs): 93.6 lbs Visceral Fat Rating : 14   Other Clinical Data Today's Visit #: 26 Starting Date: 11/03/18 Comments: Pesc     ASSESSMENT AND PLAN:  Diet: Payzley is currently in the action stage of change. As such, her goal is to continue with weight loss efforts and has agreed to the [employer].   Exercise:  For substantial health benefits, adults should do at least 150 minutes (2 hours and 30 minutes) a week of moderate-intensity, or 75 minutes (1 hour and 15 minutes) a week of vigorous-intensity aerobic physical activity, or an equivalent combination of moderate- and vigorous-intensity aerobic activity. Aerobic activity should be performed in episodes of at least 10 minutes, and preferably, it should be spread throughout the week.  Behavior Modification:  We discussed the following Behavioral Modification Strategies today: increasing lean protein intake, decreasing simple carbohydrates, meal planning and cooking strategies, keeping healthy  foods in the home, and planning for success.   Return in about 4 weeks (around 09/30/2023).Aaron Aas She was informed of the importance of frequent follow up visits to maximize her success with intensive lifestyle modifications for her multiple health conditions.  Attestation Statements:   Reviewed by clinician on day of visit: allergies, medications, problem list, medical history, surgical history, family history, social history, and previous encounter notes.    Donaciano Frizzle, MD

## 2023-09-02 NOTE — Assessment & Plan Note (Signed)
 Sleep study done on 05/28/23 showing severe sleep apnea with AHI of 43.3/hr.  Minimum O2 saturation of 70%.  She is using her CPAP now and feeling some improvement with her CPAP nightly.  She is interested in Zepbound  now that is approved by FDA for treatment of sleep apnea.  We discussed benefits, risks, common side effects today.  Due to elevated BP and treatment for hypertension alternative medication for treatment of OSA would be excluded and not appropriate.

## 2023-09-02 NOTE — Assessment & Plan Note (Signed)
 Blood pressure controlled on chlorthalidone .  No chest pain, chest pressure or headache.  No refill needed of medication today and no change in dosage.

## 2023-09-12 ENCOUNTER — Other Ambulatory Visit (HOSPITAL_COMMUNITY): Payer: Self-pay

## 2023-10-04 ENCOUNTER — Other Ambulatory Visit (HOSPITAL_COMMUNITY): Payer: Self-pay

## 2023-10-06 ENCOUNTER — Other Ambulatory Visit (HOSPITAL_COMMUNITY): Payer: Self-pay

## 2023-10-06 MED ORDER — CHLORTHALIDONE 25 MG PO TABS
25.0000 mg | ORAL_TABLET | Freq: Every morning | ORAL | 3 refills | Status: DC
  Filled 2023-10-06 – 2023-10-16 (×2): qty 90, 90d supply, fill #0
  Filled 2024-01-16 – 2024-01-30 (×2): qty 90, 90d supply, fill #1
  Filled 2024-03-08: qty 90, 90d supply, fill #2

## 2023-10-09 ENCOUNTER — Other Ambulatory Visit (HOSPITAL_COMMUNITY): Payer: Self-pay

## 2023-10-16 ENCOUNTER — Other Ambulatory Visit (HOSPITAL_COMMUNITY): Payer: Self-pay

## 2023-10-17 ENCOUNTER — Other Ambulatory Visit (HOSPITAL_COMMUNITY): Payer: Self-pay

## 2023-10-20 ENCOUNTER — Other Ambulatory Visit (HOSPITAL_COMMUNITY): Payer: Self-pay

## 2023-10-20 ENCOUNTER — Encounter (INDEPENDENT_AMBULATORY_CARE_PROVIDER_SITE_OTHER): Payer: Self-pay | Admitting: Family Medicine

## 2023-10-20 ENCOUNTER — Ambulatory Visit (INDEPENDENT_AMBULATORY_CARE_PROVIDER_SITE_OTHER): Admitting: Family Medicine

## 2023-10-20 VITALS — BP 136/85 | HR 68 | Temp 98.1°F | Ht 66.0 in | Wt 247.0 lb

## 2023-10-20 DIAGNOSIS — F32A Depression, unspecified: Secondary | ICD-10-CM

## 2023-10-20 DIAGNOSIS — F419 Anxiety disorder, unspecified: Secondary | ICD-10-CM | POA: Diagnosis not present

## 2023-10-20 DIAGNOSIS — Z6839 Body mass index (BMI) 39.0-39.9, adult: Secondary | ICD-10-CM

## 2023-10-20 DIAGNOSIS — R7303 Prediabetes: Secondary | ICD-10-CM | POA: Diagnosis not present

## 2023-10-20 MED ORDER — ESCITALOPRAM OXALATE 10 MG PO TABS
15.0000 mg | ORAL_TABLET | Freq: Every day | ORAL | 1 refills | Status: DC
  Filled 2023-10-20 – 2023-10-30 (×2): qty 135, 90d supply, fill #0
  Filled 2024-01-16 – 2024-03-08 (×2): qty 135, 90d supply, fill #1

## 2023-10-20 NOTE — Assessment & Plan Note (Signed)
 On lexapro  15mg  with good control of symptoms.  No suicidal or homicidal ideation.  Needs a refill of lexapro  today- will refill at 15mg  dose and follow up on symptom control at next appointment.

## 2023-10-20 NOTE — Assessment & Plan Note (Signed)
 Labs to be done within the next month by PCP to be reviewed at upcoming appointment.  No change in current treatment plan- will review labs at next appointment.

## 2023-10-20 NOTE — Progress Notes (Signed)
 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  Starting today she is changing to get back on track with her eating and physical activity.  For the summer she has a Astronomer and a family reunion.  She and her friend are planning a getaway twice this summer. She getting back to meal prepping today.   Carmalita is here to discuss her progress with her obesity treatment plan. She is on the [employer] and states she is following her eating plan approximately 50 % of the time. She states she is walking.   OBJECTIVE: Visit Diagnoses: Problem List Items Addressed This Visit       Other   Prediabetes   Labs to be done within the next month by PCP to be reviewed at upcoming appointment.  No change in current treatment plan- will review labs at next appointment.      Anxiety and depression - Primary   On lexapro  15mg  with good control of symptoms.  No suicidal or homicidal ideation.  Needs a refill of lexapro  today- will refill at 15mg  dose and follow up on symptom control at next appointment.      Relevant Medications   escitalopram  (LEXAPRO ) 10 MG tablet   Other Visit Diagnoses       Morbid obesity (HCC)         BMI 39.0-39.9,adult           Vitals Temp: 98.1 F (36.7 C) BP: 136/85 Pulse Rate: 68 SpO2: 99 %   Anthropometric Measurements Height: 5' 6 (1.676 m) Weight: 247 lb (112 kg) BMI (Calculated): 39.89 Weight at Last Visit: 247 lb Weight Lost Since Last Visit: 0 Weight Gained Since Last Visit: 0 Starting Weight: 249 lb Total Weight Loss (lbs): 2 lb (0.907 kg)   Body Composition  Body Fat %: 45.4 % Fat Mass (lbs): 112.2 lbs Muscle Mass (lbs): 128.2 lbs Total Body Water (lbs): 88.8 lbs Visceral Fat Rating : 14   Other Clinical Data Fasting: yes Labs: no Today's Visit #: 27 Starting Date:  11/03/18 Comments: Pesc     ASSESSMENT AND PLAN:  Diet: Rhaya is currently in the action stage of change. As such, her goal is to continue with weight loss efforts and has agreed to the [employer].  Patient took some recipes from bariatric cookbook today to implement as a smoothie option for the summer and some seafood recipes that she can prepare to assist with obtaining enough protein.  Exercise:  For substantial health benefits, adults should do at least 150 minutes (2 hours and 30 minutes) a week of moderate-intensity, or 75 minutes (1 hour and 15 minutes) a week of vigorous-intensity aerobic physical activity, or an equivalent combination of moderate- and vigorous-intensity aerobic activity. Aerobic activity should be performed in episodes of at least 10 minutes, and preferably, it should be spread throughout the week.  She is going to contemplate additional activity to do when the summer heat makes walking less available to her.  Behavior Modification:  We discussed the following Behavioral Modification Strategies today: increasing lean protein intake, decreasing simple carbohydrates, increasing vegetables, no skipping meals, and meal planning and cooking strategies.  Return in about 4 weeks (around 11/17/2023).   She was informed of the importance of frequent follow up visits to maximize her success with intensive lifestyle modifications for her multiple health conditions.  Attestation Statements:   Reviewed by clinician on day of visit: allergies, medications, problem list, medical history, surgical history, family history, social history, and previous encounter notes.   Donaciano Frizzle, MD

## 2023-10-24 ENCOUNTER — Other Ambulatory Visit (HOSPITAL_COMMUNITY): Payer: Self-pay

## 2023-10-30 ENCOUNTER — Other Ambulatory Visit (HOSPITAL_COMMUNITY): Payer: Self-pay

## 2023-12-01 ENCOUNTER — Ambulatory Visit (INDEPENDENT_AMBULATORY_CARE_PROVIDER_SITE_OTHER): Admitting: Family Medicine

## 2024-01-16 ENCOUNTER — Other Ambulatory Visit (HOSPITAL_COMMUNITY): Payer: Self-pay

## 2024-01-27 ENCOUNTER — Other Ambulatory Visit (HOSPITAL_COMMUNITY): Payer: Self-pay

## 2024-01-30 ENCOUNTER — Other Ambulatory Visit (HOSPITAL_COMMUNITY): Payer: Self-pay

## 2024-02-05 ENCOUNTER — Ambulatory Visit: Admitting: Podiatry

## 2024-02-05 ENCOUNTER — Ambulatory Visit (INDEPENDENT_AMBULATORY_CARE_PROVIDER_SITE_OTHER)

## 2024-02-05 VITALS — Ht 66.0 in | Wt 247.0 lb

## 2024-02-05 DIAGNOSIS — M7752 Other enthesopathy of left foot: Secondary | ICD-10-CM | POA: Diagnosis not present

## 2024-02-05 DIAGNOSIS — M722 Plantar fascial fibromatosis: Secondary | ICD-10-CM | POA: Diagnosis not present

## 2024-02-05 NOTE — Progress Notes (Signed)
  Subjective:  Patient ID: Mackenzie Watson, female    DOB: 11-May-1970,  MRN: [id number]  No chief complaint on file.   Discussed the use of AI scribe software for clinical note transcription with the patient, who gave verbal consent to proceed.  History of Present Illness Mackenzie Watson is a 53 year old female who presents with heel pain.  She has been experiencing heel pain for the past couple of months, specifically located under the heel, with no pain in the back of the heel or Achilles area. She has not sought medical treatment until now but has been using pain patches and taking Aleve and Tylenol for relief.  She is prescribed meloxicam but has avoided taking it due to concerns about potential internal bleeding when combined with other medications. She has a history of inflammation in her ankles, which was the initial reason for her meloxicam prescription. She has tried using insoles but found them uncomfortable, preferring to wear her shoes with socks only. She mentions wearing [employer], [employer], and [employer], which she finds comfortable.  She is currently taking meloxicam, though not consistently, and is unsure of the exact dosage, mentioning it might be 10 mg. No known allergies except for seasonal ones and no kidney issues.      Objective:    Physical Exam VASCULAR: DP and PT pulse palpable. Foot is warm and well-perfused. Capillary fill time is brisk. DERMATOLOGIC: Normal skin turgor, texture, and temperature. No open lesions, rashes, or ulcerations. NEUROLOGIC: Normal sensation to light touch and pressure. No paresthesias. ORTHOPEDIC: Pain on palpation of plantar medial band of left plantar fascia. No discontinuity of plantar fascia. No pain in Achilles. Significant pes planus deformity. Smooth pain-free range of motion of all examined joints. No ecchymosis or bruising. No gross deformity.   No images are attached to the encounter.     Results RADIOLOGY Left foot X-ray: Plantar calcaneal spur, significant pes planus deformity, no fracture or stress fracture (02/05/2024)   Assessment:   1. Capsulitis of metatarsophalangeal (MTP) joint of left foot      Plan:  Patient was evaluated and treated and all questions answered.  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 Pain localized to the plantar medial band of the plantar fascia. X-ray confirms heel spur, likely present for years. Condition exacerbated by flat foot structure, leading to inflammation of the plantar fascia. Plantar fasciitis confirmed. - Administer steroid injection today for immediate inflammation control. Risks include temporary elevation in blood sugar, bruising, swelling, and redness at the injection site. - Prescribe meloxicam 15 mg once daily for consistent inflammation management. Caution advised regarding kidney function and hydration. - Advise against concurrent use of Aleve and meloxicam; Tylenol may be used concurrently. - Recommend custom orthotics for long-term support; schedule fitting with pedorthist. - Provide home physical therapy exercises to be performed twice daily. - Schedule follow-up appointment in six weeks to assess progress.    After sterile prep with alcohol, the left heel was injected with 0.5cc 2% xylocaine plain, 0.5cc 0.5% marcaine plain, 20 mg triamcinolone acetonide, and 4 mg dexamethasone was injected along the medial plantar fascia at the insertion on the plantar calcaneus. The patient tolerated the procedure well without complication.   Return in about 6 weeks (around 03/18/2024) for recheck plantar fasciitis.

## 2024-02-05 NOTE — Patient Instructions (Signed)
 VISIT SUMMARY: Today, you were seen for heel pain that you have been experiencing for the past couple of months. The pain is specifically located under your heel and has been persistent despite using pain patches and over-the-counter medications like Aleve and Tylenol. You have a history of inflammation in your ankles and have been prescribed meloxicam, though you have not been taking it consistently due to concerns about potential side effects.  YOUR PLAN: -LEFT PLANTAR FASCIITIS WITH PLANTAR HEEL SPUR AND PES PLANUS: Plantar fasciitis is inflammation of the thick band of tissue that runs across the bottom of your foot and connects your heel bone to your toes. You also have a heel spur and flat feet, which are contributing to your pain. Today, you received a steroid injection to help control the inflammation. You are prescribed meloxicam 15 mg once daily to manage inflammation, but you should avoid taking Aleve with it. Tylenol is safe to use concurrently. Custom orthotics are recommended for long-term support, and you should schedule a fitting with a pedorthist. Additionally, you are provided with home physical therapy exercises to perform twice daily.  INSTRUCTIONS: Please schedule a follow-up appointment in six weeks to assess your progress. Make sure to schedule a fitting for custom orthotics with a pedorthist.                      Contains text generated by Abridge.                                 Contains text generated by Abridge.  Plantar Fasciitis (Heel Spur Syndrome) with Rehab The plantar fascia is a fibrous, ligament-like, soft-tissue structure that spans the bottom of the foot. Plantar fasciitis is a condition that causes pain in the foot due to inflammation of the tissue. SYMPTOMS  Pain and tenderness on the underneath side of the foot. Pain that worsens with standing or walking. CAUSES  Plantar fasciitis is caused by irritation  and injury to the plantar fascia on the underneath side of the foot. Common mechanisms of injury include: Direct trauma to bottom of the foot. Damage to a small nerve that runs under the foot where the main fascia attaches to the heel bone. Stress placed on the plantar fascia due to bone spurs. RISK INCREASES WITH:  Activities that place stress on the plantar fascia (running, jumping, pivoting, or cutting). Poor strength and flexibility. Improperly fitted shoes. Tight calf muscles. Flat feet. Failure to warm-up properly before activity. Obesity. PREVENTION Warm up and stretch properly before activity. Allow for adequate recovery between workouts. Maintain physical fitness: Strength, flexibility, and endurance. Cardiovascular fitness. Maintain a health body weight. Avoid stress on the plantar fascia. Wear properly fitted shoes, including arch supports for individuals who have flat feet.  PROGNOSIS  If treated properly, then the symptoms of plantar fasciitis usually resolve without surgery. However, occasionally surgery is necessary.  RELATED COMPLICATIONS  Recurrent symptoms that may result in a chronic condition. Problems of the lower back that are caused by compensating for the injury, such as limping. Pain or weakness of the foot during push-off following surgery. Chronic inflammation, scarring, and partial or complete fascia tear, occurring more often from repeated injections.  TREATMENT  Treatment initially involves the use of ice and medication to help reduce pain and inflammation. The use of strengthening and stretching exercises may help reduce pain with activity, especially stretches of the Achilles tendon. These exercises  may be performed at home or with a therapist. Your caregiver may recommend that you use heel cups of arch supports to help reduce stress on the plantar fascia. Occasionally, corticosteroid injections are given to reduce inflammation. If symptoms persist for  greater than 6 months despite non-surgical (conservative), then surgery may be recommended.   MEDICATION  If pain medication is necessary, then nonsteroidal anti-inflammatory medications, such as aspirin and ibuprofen, or other minor pain relievers, such as acetaminophen, are often recommended. Do not take pain medication within 7 days before surgery. Prescription pain relievers may be given if deemed necessary by your caregiver. Use only as directed and only as much as you need. Corticosteroid injections may be given by your caregiver. These injections should be reserved for the most serious cases, because they may only be given a certain number of times.  HEAT AND COLD Cold treatment (icing) relieves pain and reduces inflammation. Cold treatment should be applied for 10 to 15 minutes every 2 to 3 hours for inflammation and pain and immediately after any activity that aggravates your symptoms. Use ice packs or massage the area with a piece of ice (ice massage). Heat treatment may be used prior to performing the stretching and strengthening activities prescribed by your caregiver, physical therapist, or athletic trainer. Use a heat pack or soak the injury in warm water.  SEEK IMMEDIATE MEDICAL CARE IF: Treatment seems to offer no benefit, or the condition worsens. Any medications produce adverse side effects.  EXERCISES- RANGE OF MOTION (ROM) AND STRETCHING EXERCISES - Plantar Fasciitis (Heel Spur Syndrome) These exercises may help you when beginning to rehabilitate your injury. Your symptoms may resolve with or without further involvement from your physician, physical therapist or athletic trainer. While completing these exercises, remember:  Restoring tissue flexibility helps normal motion to return to the joints. This allows healthier, less painful movement and activity. An effective stretch should be held for at least 30 seconds. A stretch should never be painful. You should only feel a  gentle lengthening or release in the stretched tissue.  RANGE OF MOTION - Toe Extension, Flexion Sit with your right / left leg crossed over your opposite knee. Grasp your toes and gently pull them back toward the top of your foot. You should feel a stretch on the bottom of your toes and/or foot. Hold this stretch for 10 seconds. Now, gently pull your toes toward the bottom of your foot. You should feel a stretch on the top of your toes and or foot. Hold this stretch for 10 seconds. Repeat  times. Complete this stretch 3 times per day.   RANGE OF MOTION - Ankle Dorsiflexion, Active Assisted Remove shoes and sit on a chair that is preferably not on a carpeted surface. Place right / left foot under knee. Extend your opposite leg for support. Keeping your heel down, slide your right / left foot back toward the chair until you feel a stretch at your ankle or calf. If you do not feel a stretch, slide your bottom forward to the edge of the chair, while still keeping your heel down. Hold this stretch for 10 seconds. Repeat 3 times. Complete this stretch 2 times per day.   STRETCH  Gastroc, Standing Place hands on wall. Extend right / left leg, keeping the front knee somewhat bent. Slightly point your toes inward on your back foot. Keeping your right / left heel on the floor and your knee straight, shift your weight toward the wall, not allowing your  back to arch. You should feel a gentle stretch in the right / left calf. Hold this position for 10 seconds. Repeat 3 times. Complete this stretch 2 times per day.  STRETCH  Soleus, Standing Place hands on wall. Extend right / left leg, keeping the other knee somewhat bent. Slightly point your toes inward on your back foot. Keep your right / left heel on the floor, bend your back knee, and slightly shift your weight over the back leg so that you feel a gentle stretch deep in your back calf. Hold this position for 10 seconds. Repeat 3 times.  Complete this stretch 2 times per day.  STRETCH  Gastrocsoleus, Standing  Note: This exercise can place a lot of stress on your foot and ankle. Please complete this exercise only if specifically instructed by your caregiver.  Place the ball of your right / left foot on a step, keeping your other foot firmly on the same step. Hold on to the wall or a rail for balance. Slowly lift your other foot, allowing your body weight to press your heel down over the edge of the step. You should feel a stretch in your right / left calf. Hold this position for 10 seconds. Repeat this exercise with a slight bend in your right / left knee. Repeat 3 times. Complete this stretch 2 times per day.   STRENGTHENING EXERCISES - Plantar Fasciitis (Heel Spur Syndrome)  These exercises may help you when beginning to rehabilitate your injury. They may resolve your symptoms with or without further involvement from your physician, physical therapist or athletic trainer. While completing these exercises, remember:  Muscles can gain both the endurance and the strength needed for everyday activities through controlled exercises. Complete these exercises as instructed by your physician, physical therapist or athletic trainer. Progress the resistance and repetitions only as guided.  STRENGTH - Towel Curls Sit in a chair positioned on a non-carpeted surface. Place your foot on a towel, keeping your heel on the floor. Pull the towel toward your heel by only curling your toes. Keep your heel on the floor. Repeat 3 times. Complete this exercise 2 times per day.  STRENGTH - Ankle Inversion Secure one end of a rubber exercise band/tubing to a fixed object (table, pole). Loop the other end around your foot just before your toes. Place your fists between your knees. This will focus your strengthening at your ankle. Slowly, pull your big toe up and in, making sure the band/tubing is positioned to resist the entire motion. Hold this  position for 10 seconds. Have your muscles resist the band/tubing as it slowly pulls your foot back to the starting position. Repeat 3 times. Complete this exercises 2 times per day.  Document Released: 04/22/2005 Document Revised: 07/15/2011 Document Reviewed: 08/04/2008 [facility] Patient Information 2014 [place], [place].

## 2024-02-12 ENCOUNTER — Encounter (INDEPENDENT_AMBULATORY_CARE_PROVIDER_SITE_OTHER): Payer: Self-pay | Admitting: Family Medicine

## 2024-03-08 ENCOUNTER — Other Ambulatory Visit (HOSPITAL_COMMUNITY): Payer: Self-pay

## 2024-03-08 ENCOUNTER — Encounter (INDEPENDENT_AMBULATORY_CARE_PROVIDER_SITE_OTHER): Payer: Self-pay | Admitting: Family Medicine

## 2024-03-08 ENCOUNTER — Ambulatory Visit (INDEPENDENT_AMBULATORY_CARE_PROVIDER_SITE_OTHER): Payer: Self-pay | Admitting: Family Medicine

## 2024-03-08 ENCOUNTER — Other Ambulatory Visit: Payer: Self-pay | Admitting: Lab

## 2024-03-08 VITALS — BP 148/98 | HR 90 | Temp 98.2°F | Ht 66.0 in | Wt 240.0 lb

## 2024-03-08 DIAGNOSIS — Z6838 Body mass index (BMI) 38.0-38.9, adult: Secondary | ICD-10-CM

## 2024-03-08 DIAGNOSIS — I10 Essential (primary) hypertension: Secondary | ICD-10-CM

## 2024-03-08 DIAGNOSIS — F32A Depression, unspecified: Secondary | ICD-10-CM

## 2024-03-08 DIAGNOSIS — M722 Plantar fascial fibromatosis: Secondary | ICD-10-CM

## 2024-03-08 DIAGNOSIS — F419 Anxiety disorder, unspecified: Secondary | ICD-10-CM

## 2024-03-08 MED ORDER — MELOXICAM 15 MG PO TABS
15.0000 mg | ORAL_TABLET | Freq: Every day | ORAL | 0 refills | Status: AC
  Filled 2024-03-08: qty 30, 30d supply, fill #0

## 2024-03-08 NOTE — Progress Notes (Signed)
 SUBJECTIVE:  Chief Complaint: Obesity  Interim History: Patient mentions her summer went by so fast  Mackenzie Watson to sorority conference and then went and took care of her uncle.  He mentions time has been flying by.  Didn't do much for Halloween.  For upcoming holiday season she will be hanging out with family and going out of town and dietitian for Christmas.  She is trying to stick with the meal plan as best as she can- trying to stay on track to reduce her sugar cravings.  She was unaware she had lost any weight but hasn't been weighing.    Mackenzie Watson is here to discuss her progress with her obesity treatment plan. She is on the [employer] and states she is following her eating plan approximately 50 % of the time. She states she is walking.   OBJECTIVE: Visit Diagnoses: Problem List Items Addressed This Visit       Cardiovascular and Mediastinum   Essential hypertension     Other   Anxiety and depression - Primary   Other Visit Diagnoses       Morbid obesity (HCC)         BMI 38.0-38.9,adult         Plantar fasciitis           Vitals Temp: 98.2 F (36.8 C) BP: (!) 148/98 Pulse Rate: 90 SpO2: 95 %   Anthropometric Measurements Height: 5' 6 (1.676 m) Weight: 240 lb (108.9 kg) BMI (Calculated): 38.76 Weight at Last Visit: 247 lb Weight Lost Since Last Visit: 7 lb Weight Gained Since Last Visit: 0 Starting Weight: 249 lb Total Weight Loss (lbs): 9 lb (4.082 kg)   Body Composition  Body Fat %: 46.3 % Fat Mass (lbs): 111.4 lbs Muscle Mass (lbs): 122.8 lbs Total Body Water (lbs): 87.4 lbs Visceral Fat Rating : 13   Other Clinical Data Today's Visit #: 28 Starting Date: 11/03/18 Comments: Pesc     ASSESSMENT AND PLAN: Assessment & Plan Anxiety and depression Symptoms well-managed on current dose of Lexapro .  No change in treatment plan at this time but patient does need a refill of prescription.  Current dose of Lexapro  refill sent into  pharmacy. Essential hypertension Blood pressure slightly elevated today.  Patient needs a refill of chlorthalidone  at current dose.  No chest pain, chest pressure, or headache reported.  Continue current treatment at this time.  If blood pressure remains elevated at next appointment will need to reevaluate dosages of blood pressure medications. Morbid obesity (HCC)  BMI 38.0-38.9,adult  Plantar fasciitis Discussed lifestyle change modifications for treatment of plantar fasciitis which includes significant stretching of the entire lower extremity not just the foot but the calf as well as the quadriceps and hamstrings.  This would also include the IT band.  Patient is to continue to work on supportive treatment options at this time.   Diet: Mackenzie Watson is currently in the action stage of change. As such, her goal is to continue with weight loss efforts and has agreed to the [employer].   Exercise:  For substantial health benefits, adults should do at least 150 minutes (2 hours and 30 minutes) a week of moderate-intensity, or 75 minutes (1 hour and 15 minutes) a week of vigorous-intensity aerobic physical activity, or an equivalent combination of moderate- and vigorous-intensity aerobic activity. Aerobic activity should be performed in episodes of at least 10 minutes, and preferably, it should be spread throughout the week.  Behavior Modification:  We discussed the  following Behavioral Modification Strategies today: increasing lean protein intake, decreasing simple carbohydrates, meal planning and cooking strategies, avoiding temptations, and planning for success. We discussed various medication options to help Mackenzie Watson with her weight loss efforts and we both agreed to hold off on medications at this time- tirzepatide  is still too expensive.   No follow-ups on file.   She was informed of the importance of frequent follow up visits to maximize her success with intensive lifestyle modifications for  her multiple health conditions.  Attestation Statements:   Reviewed by clinician on day of visit: allergies, medications, problem list, medical history, surgical history, family history, social history, and previous encounter notes.    Mackenzie Cho, MD

## 2024-03-10 ENCOUNTER — Encounter (INDEPENDENT_AMBULATORY_CARE_PROVIDER_SITE_OTHER): Payer: Self-pay | Admitting: Family Medicine

## 2024-03-10 NOTE — Telephone Encounter (Signed)
 Message seen by Dr Berkeley

## 2024-03-14 MED ORDER — ESCITALOPRAM OXALATE 10 MG PO TABS
15.0000 mg | ORAL_TABLET | Freq: Every day | ORAL | 1 refills | Status: AC
  Filled 2024-03-14 – 2024-06-10 (×2): qty 135, 90d supply, fill #0

## 2024-03-14 MED ORDER — CHLORTHALIDONE 25 MG PO TABS
25.0000 mg | ORAL_TABLET | Freq: Every morning | ORAL | 3 refills | Status: AC
  Filled 2024-03-14 – 2024-05-03 (×2): qty 90, 90d supply, fill #0
  Filled 2024-06-10: qty 90, 90d supply, fill #1

## 2024-03-14 NOTE — Assessment & Plan Note (Signed)
 Blood pressure slightly elevated today.  Patient needs a refill of chlorthalidone  at current dose.  No chest pain, chest pressure, or headache reported.  Continue current treatment at this time.  If blood pressure remains elevated at next appointment will need to reevaluate dosages of blood pressure medications.

## 2024-03-14 NOTE — Assessment & Plan Note (Signed)
 Symptoms well-managed on current dose of Lexapro .  No change in treatment plan at this time but patient does need a refill of prescription.  Current dose of Lexapro  refill sent into pharmacy.

## 2024-03-15 ENCOUNTER — Other Ambulatory Visit (HOSPITAL_COMMUNITY): Payer: Self-pay

## 2024-03-24 ENCOUNTER — Encounter (INDEPENDENT_AMBULATORY_CARE_PROVIDER_SITE_OTHER): Payer: Self-pay

## 2024-03-25 ENCOUNTER — Ambulatory Visit: Admitting: Podiatry

## 2024-04-14 ENCOUNTER — Other Ambulatory Visit (HOSPITAL_COMMUNITY): Payer: Self-pay

## 2024-04-14 MED ORDER — PREDNISONE 20 MG PO TABS
40.0000 mg | ORAL_TABLET | Freq: Every day | ORAL | 0 refills | Status: DC
  Filled 2024-04-14: qty 10, 5d supply, fill #0

## 2024-04-14 MED ORDER — PSEUDOEPH-BROMPHEN-DM 30-2-10 MG/5ML PO SYRP
5.0000 mL | ORAL_SOLUTION | Freq: Four times a day (QID) | ORAL | 0 refills | Status: DC | PRN
  Filled 2024-04-14: qty 100, 5d supply, fill #0

## 2024-04-14 MED ORDER — IPRATROPIUM BROMIDE 0.06 % NA SOLN
2.0000 | Freq: Three times a day (TID) | NASAL | 0 refills | Status: AC
  Filled 2024-04-14: qty 15, 25d supply, fill #0

## 2024-04-16 ENCOUNTER — Other Ambulatory Visit (HOSPITAL_COMMUNITY): Payer: Self-pay

## 2024-04-16 MED ORDER — DOXYCYCLINE HYCLATE 100 MG PO TABS
100.0000 mg | ORAL_TABLET | Freq: Two times a day (BID) | ORAL | 0 refills | Status: AC
  Filled 2024-04-16: qty 20, 10d supply, fill #0

## 2024-04-19 ENCOUNTER — Encounter (INDEPENDENT_AMBULATORY_CARE_PROVIDER_SITE_OTHER): Payer: Self-pay | Admitting: Family Medicine

## 2024-04-19 ENCOUNTER — Telehealth (INDEPENDENT_AMBULATORY_CARE_PROVIDER_SITE_OTHER): Admitting: Family Medicine

## 2024-04-19 VITALS — Ht 66.0 in

## 2024-04-19 DIAGNOSIS — I10 Essential (primary) hypertension: Secondary | ICD-10-CM

## 2024-04-19 DIAGNOSIS — F419 Anxiety disorder, unspecified: Secondary | ICD-10-CM | POA: Diagnosis not present

## 2024-04-19 DIAGNOSIS — F32A Depression, unspecified: Secondary | ICD-10-CM | POA: Diagnosis not present

## 2024-04-19 DIAGNOSIS — Z6838 Body mass index (BMI) 38.0-38.9, adult: Secondary | ICD-10-CM

## 2024-04-19 NOTE — Progress Notes (Signed)
 "   TeleHealth Visit:   Mackenzie Watson has verbally consented to this audiovisual technology visit. The patient is located at home, the provider is located at the [facility] office. The participants in this visit include the listed provider and patient. The visit was conducted today via Solicitor Complaint: OBESITY Mackenzie Watson is here to discuss her progress with her obesity treatment plan along with follow-up of her obesity related diagnoses. Mackenzie Watson is on the [employer] and states she is following her eating plan approximately 40-50% of the time. Mackenzie Watson states she is walking.  No data recorded No data recorded No data recorded No data recorded  Reported Weight: n/a  Subjective:  Patient has been seen twice at the walk in clinic for URI symptoms in the last week.  She is planning to see her PCP in 2 days.  Currently has coughing and wheezing.  She doesn't have sore throat symptom anymore.  Thanksgiving was good and patient mentions she may have over indulged.  She hasn't had a big appetite over the last few weeks with her respiratory symptoms.  She has been trying to stay hydrated and is drinking quite a bit of fluids. She has Christmas break coming up and her birthday but isn't sure what she will do for either. She knows she will be meeting with family for Christmas.  There are no diagnoses linked to this encounter. Assessment/Plan:   Assessment & Plan Essential hypertension Blood pressures have been labile at office appointments.  Denies chest pain, chest pressure, or headache.  Will continue current medications at this time and follow-up on blood pressure at next appointment. Anxiety and depression Stable on Lexapro  10 mg and denies needing a refill at this time.  Feel adequate symptom control on this medication.  Will follow-up at next appointment. Morbid obesity (HCC)    There are no diagnoses linked to this encounter.  Mackenzie Watson is  currently in the action stage of change. As such, her goal is to continue with weight loss efforts and has agreed to the [employer].   Exercise goals: For substantial health benefits, adults should do at least 150 minutes (2 hours and 30 minutes) a week of moderate-intensity, or 75 minutes (1 hour and 15 minutes) a week of vigorous-intensity aerobic physical activity, or an equivalent combination of moderate- and vigorous-intensity aerobic activity. Aerobic activity should be performed in episodes of at least 10 minutes, and preferably, it should be spread throughout the week.  Wants to continue walking and then do some weight lifting at home given her current schedule  Behavioral modification strategies: increasing lean protein intake, decreasing simple carbohydrates, holiday eating strategies , celebration eating strategies, and avoiding temptations.  Mackenzie Watson has agreed to follow-up with our clinic in 5 weeks. She was informed of the importance of frequent follow-up visits to maximize her success with intensive lifestyle modifications for her multiple health conditions.   Objective:   VITALS: Per patient if applicable, see vitals. GENERAL: Alert and in no acute distress. CARDIOPULMONARY: No increased WOB. Speaking in clear sentences.  PSYCH: Pleasant and cooperative. Speech normal rate and rhythm. Affect is appropriate. Insight and judgement are appropriate. Attention is focused, linear, and appropriate.  NEURO: Oriented as arrived to appointment on time with no prompting.   Lab Results  Component Value Date   CREATININE 0.90 02/06/2023   BUN 8 02/06/2023   NA 141 02/06/2023   K 4.0 02/06/2023   CL 103 02/06/2023  CO2 24 02/06/2023   Lab Results  Component Value Date   ALT 19 02/06/2023   AST 17 02/06/2023   ALKPHOS 74 02/06/2023   BILITOT 0.4 02/06/2023   Lab Results  Component Value Date   HGBA1C 5.8 (H) 02/06/2023   HGBA1C 5.8 (H) 08/06/2021   HGBA1C 5.8 (H)  03/13/2021   HGBA1C 5.9 10/16/2020   HGBA1C 5.7 (H) 05/15/2020   Lab Results  Component Value Date   INSULIN  18.0 02/06/2023   INSULIN  16.3 08/06/2021   INSULIN  12.4 05/15/2020   INSULIN  14.9 12/14/2019   INSULIN  11.2 07/13/2019   Lab Results  Component Value Date   TSH 2.280 02/06/2023   Lab Results  Component Value Date   CHOL 195 02/06/2023   HDL 56 02/06/2023   LDLCALC 118 (H) 02/06/2023   TRIG 117 02/06/2023   Lab Results  Component Value Date   VD25OH 62.9 02/06/2023   VD25OH 61.7 08/06/2021   VD25OH 50.1 03/13/2021   Lab Results  Component Value Date   WBC 5.9 10/07/2018   HGB 13.5 10/07/2018   HCT 42.9 10/07/2018   MCV 81 10/07/2018   No results found for: IRON, TIBC, FERRITIN  Attestation Statements:   Reviewed by clinician on day of visit: allergies, medications, problem list, medical history, surgical history, family history, social history, and previous encounter notes.   Adelita Cho, MD "

## 2024-04-20 ENCOUNTER — Ambulatory Visit (INDEPENDENT_AMBULATORY_CARE_PROVIDER_SITE_OTHER): Admitting: Family Medicine

## 2024-04-21 ENCOUNTER — Other Ambulatory Visit (HOSPITAL_COMMUNITY): Payer: Self-pay

## 2024-04-21 MED ORDER — PSEUDOEPH-BROMPHEN-DM 30-2-10 MG/5ML PO SYRP
5.0000 mL | ORAL_SOLUTION | Freq: Four times a day (QID) | ORAL | 0 refills | Status: AC | PRN
  Filled 2024-04-21: qty 120, 6d supply, fill #0

## 2024-04-22 ENCOUNTER — Ambulatory Visit (INDEPENDENT_AMBULATORY_CARE_PROVIDER_SITE_OTHER): Admitting: Family Medicine

## 2024-04-22 ENCOUNTER — Encounter (INDEPENDENT_AMBULATORY_CARE_PROVIDER_SITE_OTHER): Payer: Self-pay | Admitting: Family Medicine

## 2024-04-23 ENCOUNTER — Other Ambulatory Visit (HOSPITAL_COMMUNITY): Payer: Self-pay

## 2024-05-03 ENCOUNTER — Ambulatory Visit
Admission: RE | Admit: 2024-05-03 | Discharge: 2024-05-03 | Disposition: A | Discharge status: Home / Self Care | Source: Ambulatory Visit | Attending: Family Medicine | Admitting: Family Medicine

## 2024-05-03 ENCOUNTER — Other Ambulatory Visit: Payer: Self-pay | Admitting: Family Medicine

## 2024-05-03 ENCOUNTER — Encounter (HOSPITAL_COMMUNITY): Payer: Self-pay

## 2024-05-03 ENCOUNTER — Other Ambulatory Visit (HOSPITAL_COMMUNITY): Payer: Self-pay

## 2024-05-03 DIAGNOSIS — R058 Other specified cough: Secondary | ICD-10-CM

## 2024-05-03 NOTE — Assessment & Plan Note (Signed)
 Stable on Lexapro  10 mg and denies needing a refill at this time.  Feel adequate symptom control on this medication.  Will follow-up at next appointment.

## 2024-05-03 NOTE — Assessment & Plan Note (Signed)
 Blood pressures have been labile at office appointments.  Denies chest pain, chest pressure, or headache.  Will continue current medications at this time and follow-up on blood pressure at next appointment.

## 2024-05-05 ENCOUNTER — Other Ambulatory Visit (HOSPITAL_COMMUNITY): Payer: Self-pay

## 2024-05-05 MED ORDER — BENZONATATE 200 MG PO CAPS
200.0000 mg | ORAL_CAPSULE | Freq: Three times a day (TID) | ORAL | 0 refills | Status: AC
  Filled 2024-05-05: qty 21, 7d supply, fill #0

## 2024-05-05 MED ORDER — AIRSUPRA 90-80 MCG/ACT IN AERO
2.0000 | INHALATION_SPRAY | Freq: Every day | RESPIRATORY_TRACT | 0 refills | Status: AC
  Filled 2024-05-05: qty 10.7, 30d supply, fill #0

## 2024-05-05 MED ORDER — OPTICHAMBER DIAMOND-LG MASK DEVI
0 refills | Status: AC
  Filled 2024-05-05: qty 1, 30d supply, fill #0

## 2024-05-17 ENCOUNTER — Ambulatory Visit (INDEPENDENT_AMBULATORY_CARE_PROVIDER_SITE_OTHER): Admitting: Family Medicine

## 2024-05-18 ENCOUNTER — Ambulatory Visit (INDEPENDENT_AMBULATORY_CARE_PROVIDER_SITE_OTHER): Admitting: Family Medicine

## 2024-05-27 ENCOUNTER — Telehealth (INDEPENDENT_AMBULATORY_CARE_PROVIDER_SITE_OTHER): Payer: Self-pay | Admitting: Family Medicine

## 2024-05-27 NOTE — Telephone Encounter (Signed)
 Patient came in at 4:15 today thinking she had a 4:20. She has nothing on the calendar and can only do 4:20s. Can I get permission/can someone schedule her a 4:20 the week of 02/09? She'll get the mychart notification and will be brought back up to speed.   I just need approval.

## 2024-06-10 ENCOUNTER — Other Ambulatory Visit: Payer: Self-pay

## 2024-06-10 ENCOUNTER — Other Ambulatory Visit (HOSPITAL_COMMUNITY): Payer: Self-pay

## 2024-06-14 ENCOUNTER — Ambulatory Visit (INDEPENDENT_AMBULATORY_CARE_PROVIDER_SITE_OTHER): Admitting: Family Medicine
